# Patient Record
Sex: Female | Born: 1975
Health system: Southern US, Community
[De-identification: ages and names within clinical notes are randomized; demographics above are authoritative.]

## PROBLEM LIST (undated history)

## (undated) ENCOUNTER — Inpatient Hospital Stay (HOSPITAL_COMMUNITY): Payer: Self-pay

## (undated) DIAGNOSIS — R112 Nausea with vomiting, unspecified: Secondary | ICD-10-CM

## (undated) DIAGNOSIS — N83209 Unspecified ovarian cyst, unspecified side: Principal | ICD-10-CM

## (undated) DIAGNOSIS — O09529 Supervision of elderly multigravida, unspecified trimester: Secondary | ICD-10-CM

## (undated) DIAGNOSIS — T7840XA Allergy, unspecified, initial encounter: Secondary | ICD-10-CM

## (undated) DIAGNOSIS — Z8669 Personal history of other diseases of the nervous system and sense organs: Secondary | ICD-10-CM

## (undated) DIAGNOSIS — K219 Gastro-esophageal reflux disease without esophagitis: Secondary | ICD-10-CM

## (undated) DIAGNOSIS — M51369 Other intervertebral disc degeneration, lumbar region without mention of lumbar back pain or lower extremity pain: Secondary | ICD-10-CM

## (undated) DIAGNOSIS — Z9889 Other specified postprocedural states: Secondary | ICD-10-CM

## (undated) DIAGNOSIS — D649 Anemia, unspecified: Secondary | ICD-10-CM

## (undated) DIAGNOSIS — J45909 Unspecified asthma, uncomplicated: Secondary | ICD-10-CM

## (undated) DIAGNOSIS — O24419 Gestational diabetes mellitus in pregnancy, unspecified control: Secondary | ICD-10-CM

## (undated) DIAGNOSIS — Z8489 Family history of other specified conditions: Secondary | ICD-10-CM

## (undated) DIAGNOSIS — G709 Myoneural disorder, unspecified: Secondary | ICD-10-CM

## (undated) DIAGNOSIS — M5136 Other intervertebral disc degeneration, lumbar region: Secondary | ICD-10-CM

## (undated) DIAGNOSIS — M1990 Unspecified osteoarthritis, unspecified site: Secondary | ICD-10-CM

## (undated) HISTORY — DX: Allergy, unspecified, initial encounter: T78.40XA

## (undated) HISTORY — DX: Myoneural disorder, unspecified: G70.9

## (undated) HISTORY — DX: Other intervertebral disc degeneration, lumbar region without mention of lumbar back pain or lower extremity pain: M51.369

## (undated) HISTORY — DX: Unspecified osteoarthritis, unspecified site: M19.90

## (undated) HISTORY — PX: OTHER SURGICAL HISTORY: SHX169

## (undated) HISTORY — DX: Gastro-esophageal reflux disease without esophagitis: K21.9

## (undated) HISTORY — DX: Gestational diabetes mellitus in pregnancy, unspecified control: O24.419

## (undated) HISTORY — PX: TONSILLECTOMY: SUR1361

## (undated) HISTORY — PX: SMALL INTESTINE SURGERY: SHX150

## (undated) HISTORY — DX: Anemia, unspecified: D64.9

## (undated) HISTORY — PX: TUBAL LIGATION: SHX77

## (undated) HISTORY — DX: Personal history of other diseases of the nervous system and sense organs: Z86.69

## (undated) HISTORY — DX: Supervision of elderly multigravida, unspecified trimester: O09.529

## (undated) HISTORY — PX: HERNIA REPAIR: SHX51

## (undated) HISTORY — DX: Unspecified asthma, uncomplicated: J45.909

## (undated) HISTORY — DX: Other intervertebral disc degeneration, lumbar region: M51.36

## (undated) HISTORY — DX: Unspecified ovarian cyst, unspecified side: N83.209

---

## 2000-10-19 ENCOUNTER — Ambulatory Visit (HOSPITAL_COMMUNITY): Admission: RE | Admit: 2000-10-19 | Discharge: 2000-10-19 | Payer: Self-pay | Admitting: Pulmonary Disease

## 2004-06-11 ENCOUNTER — Encounter: Admission: RE | Admit: 2004-06-11 | Discharge: 2004-09-09 | Payer: Self-pay | Admitting: Neurology

## 2004-09-19 ENCOUNTER — Encounter: Admission: RE | Admit: 2004-09-19 | Discharge: 2004-10-02 | Payer: Self-pay | Admitting: Neurology

## 2005-08-12 ENCOUNTER — Encounter: Admission: RE | Admit: 2005-08-12 | Discharge: 2005-08-12 | Payer: Self-pay | Admitting: General Surgery

## 2005-08-13 ENCOUNTER — Ambulatory Visit (HOSPITAL_COMMUNITY): Admission: RE | Admit: 2005-08-13 | Discharge: 2005-08-13 | Payer: Self-pay | Admitting: General Surgery

## 2005-08-22 ENCOUNTER — Ambulatory Visit (HOSPITAL_COMMUNITY): Admission: RE | Admit: 2005-08-22 | Discharge: 2005-08-22 | Payer: Self-pay | Admitting: General Surgery

## 2005-11-25 ENCOUNTER — Ambulatory Visit: Payer: Self-pay | Admitting: Gastroenterology

## 2005-12-01 ENCOUNTER — Ambulatory Visit: Payer: Self-pay | Admitting: Gastroenterology

## 2005-12-01 ENCOUNTER — Encounter: Payer: Self-pay | Admitting: Gastroenterology

## 2006-01-01 ENCOUNTER — Ambulatory Visit: Payer: Self-pay | Admitting: Gastroenterology

## 2007-02-09 ENCOUNTER — Encounter: Admission: RE | Admit: 2007-02-09 | Discharge: 2007-05-10 | Payer: Self-pay | Admitting: General Surgery

## 2007-02-23 ENCOUNTER — Inpatient Hospital Stay (HOSPITAL_COMMUNITY): Admission: RE | Admit: 2007-02-23 | Discharge: 2007-02-25 | Payer: Self-pay | Admitting: General Surgery

## 2007-02-24 ENCOUNTER — Encounter (INDEPENDENT_AMBULATORY_CARE_PROVIDER_SITE_OTHER): Payer: Self-pay | Admitting: General Surgery

## 2007-02-24 ENCOUNTER — Ambulatory Visit: Payer: Self-pay | Admitting: Vascular Surgery

## 2007-11-16 ENCOUNTER — Other Ambulatory Visit: Admission: RE | Admit: 2007-11-16 | Discharge: 2007-11-16 | Payer: Self-pay | Admitting: Obstetrics and Gynecology

## 2009-02-19 ENCOUNTER — Encounter: Admission: RE | Admit: 2009-02-19 | Discharge: 2009-02-19 | Payer: Self-pay | Admitting: General Surgery

## 2009-10-31 ENCOUNTER — Other Ambulatory Visit: Admission: RE | Admit: 2009-10-31 | Discharge: 2009-10-31 | Payer: Self-pay | Admitting: Obstetrics and Gynecology

## 2010-05-12 ENCOUNTER — Encounter: Payer: Self-pay | Admitting: General Surgery

## 2010-09-03 NOTE — Op Note (Signed)
NAMEJAMARRIA, REAL NO.:  [id number]   MEDICAL RECORD NO.:  [id number]          PATIENT TYPE:  INP   LOCATION:  1530                         FACILITY:  [facility]   PHYSICIAN:  Sandria Bales. Ezzard Standing, M.D.  DATE OF BIRTH:  10/30/1975   DATE OF PROCEDURE:  02/23/2007  DATE OF DISCHARGE:                               OPERATIVE REPORT   PREOPERATIVE DIAGNOSIS:  Morbid obesity, status post laparoscopic Roux-  en-Y gastric bypass.   POSTOPERATIVE DIAGNOSIS:  Morbid obesity, status post laparoscopic Roux-  en-Y gastric bypass.   PROCEDURE:  Upper endoscopy.   SURGEON:  Sandria Bales. Ezzard Standing, M.D.   FIRST ASSISTANT:  None.   ANESTHESIA:  General endotracheal.   ESTIMATED BLOOD LOSS:  None.   DESCRIPTION OF PROCEDURE:  [name] is a 35 year old white female who  has completed a laparoscopic Roux-en-Y gastric bypass by Dr. Jaclynn Guarneri.  I am doing upper endoscopy to confirm patency of the  anastomosis and a check for leak and viability of the pouch.   OPERATIVE NOTE:  With the patient in supine position, I passed an  Olympus flexible endoscope without difficulty down the back of throat  into the stomach pouch.  I identified the gastrojejunal anastomosis at  43-44 cm; it was widely patent.  I insufflated with air, while Dr.  Johna Sheriff clamped the small bowel.  Dr. Johna Sheriff flooded the upper  abdomen with saline and there was no bubbling or no air leak.   I then withdrew to the esophagogastric junction, which was at 38-39 cm;  so this is about a 4-5 cm patch.  The pouch was tubular appearing.  The  mucosa looked healthy.   The distal esophagus was also within normal limits.   The patient tolerated the procedure well.  I withdrew the scope.  Dr.  Johna Sheriff will dictate the laparoscopic Roux-en-Y gastric bypass.      Sandria Bales. Ezzard Standing, M.D.  Electronically Signed     DHN/MEDQ  D:  02/23/2007  T:  02/23/2007  Job:  [id number]

## 2010-09-03 NOTE — Op Note (Signed)
Kristen Dunn, Kristen Dunn                   ACCOUNT NO.:  [id number]   MEDICAL RECORD NO.:  [id number]          PATIENT TYPE:  INP   LOCATION:  0001                         FACILITY:  [facility]   PHYSICIAN:  Sharlet Salina T. Hoxworth, M.D.DATE OF BIRTH:  Aug 30, 1975   DATE OF PROCEDURE:  02/23/2007  DATE OF DISCHARGE:                               OPERATIVE REPORT   PREOPERATIVE DIAGNOSIS:  Morbid obesity.   POSTOPERATIVE DIAGNOSIS:  Morbid obesity.   SURGICAL PROCEDURES:  Laparoscopic Roux-en-Y gastric bypass.   SURGEON:  Glenna Fellows, M.D.   ASSISTANT:  Ovidio Kin, M.D.   ANESTHESIA:  General.   BRIEF HISTORY:  [name] is a 35 year old white female with a long  history of progressive morbid obesity and comorbidities of GERD, asthma  and joint pain.  After extensive pre-operative discussion and workup  detailed elsewhere, we elected to proceed with laparoscopic Roux-en-Y  gastric bypass.  The patient was brought to the operating room for this  procedure.   DESCRIPTION OF OPERATION:  The patient was brought to the operating room  and placed in a supine position on the operating table and general  endotracheal anesthesia was induced.  She had received preoperative IV  antibiotics.  She had an antibiotic and mechanical bowel prep at home.  _______ were in place.  She received preoperative subcutaneous Lovenox.  The abdomen was widely sterilely prepped and draped.  Correct patient  and procedure were verified.  Access was obtained through a 1 cm left  subcostal incision using the OptiVu trocar without difficulty and  pneumoperitoneum established.  Inspection was made for any evidence of  trocar injury and none seen.  Under direct vision an 11 mm trocar was  placed in the right para xiphoid area through the falciform ligament in  the right upper abdomen just above and to the left of the umbilicus  where the camera port and a 5 mm trocar placed in the left flank.  The  mesocolon was  elevated and the ligament of Treitz was clearly identified  and a 40-cm biliopancreatic limb measured.  At this point the small  bowel was divided with a single firing of the white load 45 mm stapler.  The mesentery was further divided a short distance with a harmonic  scalpel for mobility.  A Penrose drain was sutured to the end of the  Roux limb for identification.  Following this is a 100 cm Roux limb was  carefully measured.  At this point a side-to-side anastomosis was  created between the biliopancreatic and Roux limb creating enterotomies  with the harmonic scalpel and using a 45 mm white load stapler.  The  staple line was intact without bleeding.  The common enterotomy was  closed with running 2-0 Vicryl beginning at either end of the enterotomy  and tied centrally.  The anastomosis appeared to be widely patent with  excellent blood supply.  The mesenteric defect behind the  jejunojejunostomy was then exposed and was closed with a running 2-0  silk.  Suture and staple lines were coated with Tisseel tissue sealant.  The  patient was then placed in steep reverse Trendelenburg and through a  5 mm subxiphoid site the [facility] retractor was placed in the left lobe of  the liver and elevated with excellent exposure of the hiatus and  stomach.  The angle of His was mobilized with the harmonic scalpel back  down toward the left crus.  A 4 cm pouch at the EG junction was measured  along the lesser curve and the peritoneum was incised along the lesser  curve.  The stomach was elevated and careful dissection with the  harmonic scalpel was carried along the wall of the stomach back toward  the lesser sac and the free lesser sac entered without difficulty.  Initial firing of the gold echelon stapler about 4 cm across the stomach  was performed to right angles to the lesser curve.  The posterior  gastric attachments were then dissected up through the previously  dissected area at the angle of His  and a single blue load firing of the  60 mm echelon stapler was used to complete the gastric pouch creating a  nice tubular pouch about 4 cm.  The staple line of the gastric remnant  was oversewn with 2-0 silk.  The Roux limb was brought up anticolic to  the small gastric pouch without any tension.  An initial posterior  running 2-0 Vicryl was placed between the Roux limb and the pouch.  Enterotomies using the harmonic scalpel were then created between the  pouch and the Roux limb and a 45 mm blue load linear stapler was used to  create the anastomosis about 2 cm in length.  The staple line was intact  and without bleeding.  The common enterotomy was then closed from either  end tying in the middle with running 2-0 Vicryl.  The Ewald tube was  then passed down through the anastomosis and an outer running layer of 2-  0 Vicryl seromuscular suture was placed.  Following this the Ewald tube  was removed.  With the outlet of anastomosis clamped, Dr. Ezzard Standing  performed upper endoscopy and with the pouch tensely distended with air  under saline irrigation there was no evidence of leak and anastomosis  appeared widely patent.  Following this all air was suctioned.  The  staple and suture lines of the gastrojejunostomy were coated with  Tisseel tissue sealant.  It was carefully inspected for any trocar  injury or hemorrhage and none seen.  Trocars were removed and all CO2  evacuated.  Skin incisions were closed with Dermabond.  Sponge and  needle counts were correct.  The patient was taken to recovery in good  condition.      Lorne Skeens. Hoxworth, M.D.  Electronically Signed     BTH/MEDQ  D:  02/23/2007  T:  02/23/2007  Job:  [id number]

## 2010-09-06 NOTE — Assessment & Plan Note (Signed)
[facility]                           GASTROENTEROLOGY OFFICE NOTE   NAME:Kristen Dunn, Kristen Dunn                          MRN:          [id number]  DATE:01/01/2006                            DOB:          March 23, 1976    Tobi Bastos had endoscopy and esophageal diltation.  She had a 5 cm hiatal hernia,  peptic stricture of the esophagus.  Biopsy showed no evidence of Barrett's  mucosa.   She initially was on AcipHex 20 mg twice a day and is now on it once a day  and is completely asymptomatic.  She has had no side effects from Aciphex  that she had noted previously with Nexium and Prilosec.  We had a long  discussion concerning acid reflux management.  I will continue to follow her  on an as needed basis as needed.  I have given her good standard anti-reflux  regimen with her but think she probably is a good candidate for long-term  PPI therapy because of her complicated acid reflux disease.                                   Vania Rea. Jarold Motto, MD, Clementeen [place], [place]   DRP/MedQ  DD:  01/01/2006  DT:  01/02/2006  Job #:  [id number]   cc:   Ramon Dredge L. Juanetta Gosling, M.D.  Lorne Skeens. Hoxworth, M.D.

## 2010-09-06 NOTE — Assessment & Plan Note (Signed)
Kristen Dunn                           GASTROENTEROLOGY OFFICE NOTE   NAME:Dunn, Kristen MILLAR                          MRN:          [id number]  DATE:11/25/2005                            DOB:          Sep 21, 1975    [name] is a 35 year old white female employee at [employer].  She is  referred by Dr. Juanetta Dunn and Dr. Johna Dunn today for evaluation of recurrent  refractory reflux symptoms over the last 8-10 years with 6-8 months of  progressive solid food dysphagia.   Kristen Dunn has been intolerant of Nexium and Prilosec because of nausea.  She has  been taking Pepcid twice daily.  She continues with severe regurgitation,  burning sternal chest pain, both night and daytime.  For the last 6-8  months, she has had progressive solid food dysphagia in her mid substernal  area.  She denies hepatobiliary complaints.  She has morbid obesity and has  plans for bariatric surgery later this year.  Ultrasound of the upper  abdomen in April of this year was unremarkable without any evidence of fatty  infiltration of the liver or cholelithiasis.   The patient does have asthmatic bronchitis, which she feels is acid reflux  related.  She uses p.r.n. ventilator inhalers.  She denies other extra  esophageal manifestations of GERD.  She has regular bowel movements without  melena or hematochezia.   PAST MEDICAL HISTORY:  Otherwise remarkable for degenerative arthritis and  chronic headaches.   MEDICATIONS:  Pepcid over-the-counter 2 daily, Depo-Provera shots every 3  months, p.r.n. Ventolin inhalers, and p.r.n. Advil.   She denies drug allergies.   FAMILY HISTORY:  Remarkable for colon polyps in her mother, age 61,  otherwise noncontributory.   SOCIAL HISTORY:  The patient is single and lives with her mother.  She has  an associate degree.  She does not smoke or abuse ethanol.   REVIEW OF SYSTEMS:  Noncontributory without history of Raynaud's phenomenon  or other symptoms  of collagen vascular disease.  She does have a mild  chronic anemia, diffuse arthralgias, insomnia, chronic low back pain.  Has  not had a menstrual period since Sep 07, 2005 because of Depo shots.  Review  of systems otherwise noncontributory.   PHYSICAL EXAMINATION:  GENERAL:  Exam shows her to be a healthy-appearing  white female in no distress.  I could not appreciate stigmata of chronic  liver disease.  There is no thyromegaly noted.  VITAL SIGNS:  She weighs 281 pounds.  She is 5 feet, 4-1/2 inches tall.  Blood pressure 140/90, pulse 100 and regular.  CHEST:  Clear.  HEART:  Appeared to be in a regular rhythm without murmurs, rubs or gallops.  ABDOMEN:  Exogenous obesity but no organomegaly, masses, or localized  tenderness.  Bowel sounds were normal.  EXTREMITIES:  Her upper extremities were unremarkable.  MENTAL STATUS:  Normal.  RECTAL:  Deferred.   ASSESSMENT:  1.  Morbid obesity with planned bariatric surgery.  2.  Chronic acid reflux, probably from peptic stricture of the esophagus.  3.  Irregular menstrual periods, on Depo-Provera  shots.  4.  History of degenerative arthritis.  5.  History of asthmatic bronchitis, probably related to acid reflux.   RECOMMENDATIONS:  1.  Acid reflux regime and restrictions.  2.  Trial of AcipHex 20 mg 30 minutes before breakfast and supper twice      daily.  3.  Outpatient endoscopic exam and probable esophageal dilatation.                                   Kristen Rea. Jarold Motto, MD, Kristen Dunn, Kristen Dunn   Kristen Dunn  DD:  11/25/2005  DT:  11/25/2005  Kristen #:  [id number]   cc:   Kristen Dredge L. Kristen Gosling, MD  Kristen Skeens. Hoxworth, MD

## 2011-01-28 LAB — DIFFERENTIAL
Eosinophils Absolute: 0
Eosinophils Absolute: 0
Lymphocytes Relative: 29
Lymphocytes Relative: 7 — ABNORMAL LOW
Lymphs Abs: 0.9
Lymphs Abs: 3
Monocytes Absolute: 0.8 — ABNORMAL HIGH
Monocytes Relative: 6
Monocytes Relative: 8
Neutrophils Relative %: 87 — ABNORMAL HIGH

## 2011-01-28 LAB — CBC
MCHC: 34.7
MCV: 92.3
MCV: 92.4
Platelets: 355
Platelets: 381
RBC: 3.62 — ABNORMAL LOW
RDW: 13.3
RDW: 13.7

## 2011-01-28 LAB — HEMOGLOBIN AND HEMATOCRIT, BLOOD
HCT: 32.3 — ABNORMAL LOW
Hemoglobin: 12.7

## 2011-01-29 LAB — HEMOGLOBIN AND HEMATOCRIT, BLOOD
HCT: 39.6
Hemoglobin: 13.6

## 2011-06-12 ENCOUNTER — Other Ambulatory Visit: Payer: Self-pay | Admitting: Adult Health

## 2011-06-12 ENCOUNTER — Other Ambulatory Visit (HOSPITAL_COMMUNITY)
Admission: RE | Admit: 2011-06-12 | Discharge: 2011-06-12 | Disposition: A | Discharge status: Home / Self Care | Payer: BC Managed Care – PPO | Source: Ambulatory Visit | Attending: Obstetrics and Gynecology | Admitting: Obstetrics and Gynecology

## 2011-06-12 DIAGNOSIS — Z113 Encounter for screening for infections with a predominantly sexual mode of transmission: Secondary | ICD-10-CM | POA: Insufficient documentation

## 2011-06-12 DIAGNOSIS — Z01419 Encounter for gynecological examination (general) (routine) without abnormal findings: Secondary | ICD-10-CM | POA: Insufficient documentation

## 2011-06-12 LAB — OB RESULTS CONSOLE ABO/RH: RH Type: POSITIVE

## 2011-06-12 LAB — OB RESULTS CONSOLE GC/CHLAMYDIA: Chlamydia: NEGATIVE

## 2011-06-12 LAB — OB RESULTS CONSOLE HEPATITIS B SURFACE ANTIGEN: Hepatitis B Surface Ag: NEGATIVE

## 2011-06-12 LAB — OB RESULTS CONSOLE HIV ANTIBODY (ROUTINE TESTING): HIV: NONREACTIVE

## 2011-06-12 LAB — OB RESULTS CONSOLE RUBELLA ANTIBODY, IGM: Rubella: IMMUNE

## 2011-06-12 LAB — OB RESULTS CONSOLE ANTIBODY SCREEN: Antibody Screen: NEGATIVE

## 2011-11-19 ENCOUNTER — Encounter: Payer: BC Managed Care – PPO | Attending: Obstetrics & Gynecology | Admitting: Dietician

## 2011-11-19 VITALS — Ht 65.0 in | Wt 212.5 lb

## 2011-11-19 DIAGNOSIS — O99810 Abnormal glucose complicating pregnancy: Secondary | ICD-10-CM | POA: Insufficient documentation

## 2011-11-19 DIAGNOSIS — O24419 Gestational diabetes mellitus in pregnancy, unspecified control: Secondary | ICD-10-CM

## 2011-11-19 DIAGNOSIS — Z713 Dietary counseling and surveillance: Secondary | ICD-10-CM | POA: Insufficient documentation

## 2011-11-20 ENCOUNTER — Encounter: Payer: Self-pay | Admitting: Dietician

## 2011-11-20 NOTE — Progress Notes (Signed)
  Patient was seen on 11/19/2011 for Gestational Diabetes self-management class at [facility]. She reports a history of gastric bypass surgery 02/23/07. This is her first pregnancy.  She completed the class but will need 1:1 follow-up for dietary guidelines in light of her gastric bypass surgery. The following learning objectives were met by the patient during this course:   States the definition of Gestational Diabetes  States why dietary management is important in controlling blood glucose  Describes the effects each nutrient has on blood glucose levels  Demonstrates ability to create a balanced meal plan  Demonstrates carbohydrate counting   States when to check blood glucose levels  Demonstrates proper blood glucose monitoring techniques  States the effect of stress and exercise on blood glucose levels  States the importance of limiting caffeine and abstaining from alcohol and smoking  Blood glucose monitor given: One Touch Ultra Mini Lot # [id number] X Exp: 11/2012 Blood glucose reading: 92 at 6:00 PM  Patient instructed to monitor glucose levels:Fasting and 2 hr after first bite of each meal FBS: 60 - <90 2 hour: <120  Patient received handouts:  Nutrition Diabetes and Pregnancy  Carbohydrate Counting List  Patient will be seen for follow-up as needed.

## 2011-12-10 ENCOUNTER — Inpatient Hospital Stay (HOSPITAL_COMMUNITY)
Admission: AD | Admit: 2011-12-10 | Discharge: 2011-12-10 | Disposition: A | Discharge status: Home / Self Care | Payer: BC Managed Care – PPO | Source: Ambulatory Visit | Attending: Obstetrics & Gynecology | Admitting: Obstetrics & Gynecology

## 2011-12-10 ENCOUNTER — Other Ambulatory Visit: Payer: Self-pay | Admitting: Obstetrics & Gynecology

## 2011-12-10 DIAGNOSIS — O99810 Abnormal glucose complicating pregnancy: Secondary | ICD-10-CM | POA: Insufficient documentation

## 2011-12-10 MED ORDER — LACTATED RINGERS IV SOLN
INTRAVENOUS | Status: DC
  Administered 2011-12-10: 18:00:00 via INTRAVENOUS

## 2011-12-10 MED ORDER — FERUMOXYTOL INJECTION 510 MG/17 ML
510.0000 mg | Freq: Once | INTRAVENOUS | Status: AC
  Administered 2011-12-10: 510 mg via INTRAVENOUS
  Filled 2011-12-10: qty 17

## 2011-12-25 LAB — OB RESULTS CONSOLE GBS: GBS: POSITIVE

## 2012-01-09 ENCOUNTER — Telehealth (HOSPITAL_COMMUNITY): Payer: Self-pay | Admitting: *Deleted

## 2012-01-09 ENCOUNTER — Encounter (HOSPITAL_COMMUNITY): Payer: Self-pay | Admitting: *Deleted

## 2012-01-09 NOTE — Telephone Encounter (Signed)
Preadmission screen  

## 2012-01-11 ENCOUNTER — Inpatient Hospital Stay (HOSPITAL_COMMUNITY)
Admission: RE | Admit: 2012-01-11 | Discharge: 2012-01-15 | DRG: 651 | Disposition: A | Discharge status: Home / Self Care | Payer: BC Managed Care – PPO | Source: Ambulatory Visit | Attending: Obstetrics & Gynecology | Admitting: Obstetrics & Gynecology

## 2012-01-11 ENCOUNTER — Encounter (HOSPITAL_COMMUNITY): Payer: Self-pay

## 2012-01-11 VITALS — BP 102/69 | HR 85 | Temp 98.3°F | Resp 18 | Ht 65.0 in | Wt 223.0 lb

## 2012-01-11 DIAGNOSIS — O09519 Supervision of elderly primigravida, unspecified trimester: Secondary | ICD-10-CM | POA: Diagnosis present

## 2012-01-11 DIAGNOSIS — O99814 Abnormal glucose complicating childbirth: Principal | ICD-10-CM | POA: Diagnosis present

## 2012-01-11 DIAGNOSIS — Z22330 Carrier of Group B streptococcus: Secondary | ICD-10-CM

## 2012-01-11 DIAGNOSIS — O99892 Other specified diseases and conditions complicating childbirth: Secondary | ICD-10-CM | POA: Diagnosis present

## 2012-01-11 DIAGNOSIS — Z98891 History of uterine scar from previous surgery: Secondary | ICD-10-CM

## 2012-01-11 LAB — CBC
HCT: 30 % — ABNORMAL LOW (ref 36.0–46.0)
Hemoglobin: 9.1 g/dL — ABNORMAL LOW (ref 12.0–15.0)
MCHC: 30.3 g/dL (ref 30.0–36.0)
MCV: 78.9 fL (ref 78.0–100.0)
RDW: 22.5 % — ABNORMAL HIGH (ref 11.5–15.5)

## 2012-01-11 LAB — GLUCOSE, CAPILLARY: Glucose-Capillary: 78 mg/dL (ref 70–99)

## 2012-01-11 MED ORDER — LIDOCAINE HCL (PF) 1 % IJ SOLN: 30.0000 mL | INTRAMUSCULAR | Status: DC | PRN

## 2012-01-11 MED ORDER — MISOPROSTOL 25 MCG QUARTER TABLET
25.0000 ug | ORAL_TABLET | ORAL | Status: DC | PRN
  Administered 2012-01-11 (×2): 25 ug via VAGINAL
  Filled 2012-01-11 (×2): qty 0.25

## 2012-01-11 MED ORDER — LACTATED RINGERS IV SOLN
INTRAVENOUS | Status: DC
  Administered 2012-01-11: 16:00:00 via INTRAVENOUS
  Administered 2012-01-11: 125 mL/h via INTRAVENOUS
  Administered 2012-01-12 – 2012-01-13 (×2): via INTRAVENOUS
  Administered 2012-01-13: 125 mL/h via INTRAVENOUS
  Administered 2012-01-13 (×2): via INTRAVENOUS

## 2012-01-11 MED ORDER — ONDANSETRON HCL 4 MG/2ML IJ SOLN: 4.0000 mg | Freq: Four times a day (QID) | INTRAMUSCULAR | Status: DC | PRN

## 2012-01-11 MED ORDER — ACETAMINOPHEN 325 MG PO TABS
650.0000 mg | ORAL_TABLET | ORAL | Status: DC | PRN
  Administered 2012-01-11 – 2012-01-12 (×6): 650 mg via ORAL
  Filled 2012-01-11 (×6): qty 2

## 2012-01-11 MED ORDER — FLEET ENEMA 7-19 GM/118ML RE ENEM: 1.0000 | ENEMA | RECTAL | Status: DC | PRN

## 2012-01-11 MED ORDER — OXYTOCIN BOLUS FROM INFUSION
500.0000 mL | Freq: Once | INTRAVENOUS | Status: DC
  Filled 2012-01-11: qty 500

## 2012-01-11 MED ORDER — IBUPROFEN 600 MG PO TABS: 600.0000 mg | ORAL_TABLET | Freq: Four times a day (QID) | ORAL | Status: DC | PRN

## 2012-01-11 MED ORDER — OXYTOCIN 40 UNITS IN LACTATED RINGERS INFUSION - SIMPLE MED: 62.5000 mL/h | Freq: Once | INTRAVENOUS | Status: DC

## 2012-01-11 MED ORDER — TERBUTALINE SULFATE 1 MG/ML IJ SOLN: 0.2500 mg | Freq: Once | INTRAMUSCULAR | Status: AC | PRN

## 2012-01-11 MED ORDER — LACTATED RINGERS IV SOLN
500.0000 mL | INTRAVENOUS | Status: DC | PRN
  Administered 2012-01-13: 1000 mL via INTRAVENOUS
  Administered 2012-01-13: 500 mL via INTRAVENOUS

## 2012-01-11 MED ORDER — GLYBURIDE 2.5 MG PO TABS
2.5000 mg | ORAL_TABLET | Freq: Every day | ORAL | Status: DC
  Filled 2012-01-11: qty 1

## 2012-01-11 MED ORDER — OXYCODONE-ACETAMINOPHEN 5-325 MG PO TABS: 1.0000 | ORAL_TABLET | ORAL | Status: DC | PRN

## 2012-01-11 MED ORDER — CITRIC ACID-SODIUM CITRATE 334-500 MG/5ML PO SOLN
30.0000 mL | ORAL | Status: DC | PRN
  Administered 2012-01-13: 30 mL via ORAL
  Filled 2012-01-11: qty 15

## 2012-01-11 MED ORDER — GLYBURIDE 5 MG PO TABS
5.0000 mg | ORAL_TABLET | Freq: Every day | ORAL | Status: DC
Start: 2012-01-11 — End: 2012-01-11
  Administered 2012-01-11: 2.5 mg via ORAL
  Filled 2012-01-11 (×2): qty 1

## 2012-01-11 MED ORDER — VANCOMYCIN HCL IN DEXTROSE 1-5 GM/200ML-% IV SOLN
1000.0000 mg | Freq: Two times a day (BID) | INTRAVENOUS | Status: DC
  Administered 2012-01-11 – 2012-01-13 (×4): 1000 mg via INTRAVENOUS
  Filled 2012-01-11 (×5): qty 200

## 2012-01-11 MED ORDER — ZOLPIDEM TARTRATE 5 MG PO TABS
5.0000 mg | ORAL_TABLET | Freq: Once | ORAL | Status: AC
  Administered 2012-01-11: 5 mg via ORAL

## 2012-01-11 NOTE — Plan of Care (Signed)
Problem: Consults Goal: Birthing Suites Patient Information Press F2 to bring up selections list  Outcome: Completed/Met Date Met:  01/11/12  Diabetic

## 2012-01-11 NOTE — H&P (Signed)
Kristen Dunn is a 36 y.o. female @ [redacted]w[redacted]d presenting for IOL for GDM controlled w/Glyburide.   Maternal Medical History:  Contractions: Frequency: rare.   Perceived severity is mild.    Fetal activity: Perceived fetal activity is normal.   Last perceived fetal movement was within the past hour.      OB History    Grav Para Term Preterm Abortions TAB SAB Ect Mult Living   1              Past Medical History  Diagnosis Date  . Asthma   . Anemia   . AMA (advanced maternal age) multigravida 35+   . Gestational diabetes     glyburide   Past Surgical History  Procedure Date  . Gastric bypass 2008   . Tonsillectomy    Family History: family history includes Aneurysm in her father; Asthma in her mother and sister; COPD in her mother; Hypertension in her father; Stroke in her maternal grandmother; Thyroid disease in her maternal grandmother; and Von Willebrand disease in her cousin. Social History:  reports that she has never smoked. She has never used smokeless tobacco. She reports that she does not drink alcohol or use illicit drugs.   Prenatal Transfer Tool  Maternal Diabetes: Yes:  Diabetes Type:  Insulin/Medication controlled Genetic Screening: Abnormal:  Results: Elevated risk of Trisomy 21 Maternal Ultrasounds/Referrals: Normal Fetal Ultrasounds or other Referrals:  None Maternal Substance Abuse:  No Significant Maternal Medications:  None Significant Maternal Lab Results:  None Other Comments:  None  Review of Systems  Constitutional: Negative.   HENT: Negative.   Eyes: Negative.   Respiratory: Negative.   Cardiovascular: Negative.   Gastrointestinal: Negative.   Genitourinary: Negative.   Musculoskeletal: Negative.   Skin: Negative.   Neurological: Negative.   Psychiatric/Behavioral: Negative.    EFW on 12/25/11 was 3095 grams, 55%  Dilation: Closed Effacement (%): Thick Station: -3 Exam by:: J.Thornton, RN Blood pressure 114/61, pulse 87, temperature 98.9  F (37.2 C), temperature source Oral, resp. rate 16, height 5\' 5"  (1.651 m), weight 101.152 kg (223 lb), last menstrual period 04/13/2011.  Maternal Exam:  Uterine Assessment: Contraction strength is mild.  Contraction duration is 50 seconds. Contraction frequency is irregular.   Abdomen: Patient reports no abdominal tenderness. Estimated fetal weight is 8lbs.       Fetal Exam Fetal Monitor Review: Mode: ultrasound.   Baseline rate: 140.  Variability: moderate (6-25 bpm).   Pattern: accelerations present and no decelerations.    Fetal State Assessment: Category I - tracings are normal.     Physical Exam  Constitutional: She is oriented to person, place, and time. She appears well-developed.  HENT:  Head: Normocephalic.  Neck: Normal range of motion.  Cardiovascular: Normal rate.   Respiratory: Effort normal.  GI: Soft.  Musculoskeletal: Normal range of motion.  Neurological: She is alert and oriented to person, place, and time.  Skin: Skin is warm and dry.  Psychiatric: She has a normal mood and affect.    Prenatal labs: ABO, Rh: A/Positive/-- (02/21 0000) Antibody: Negative (02/21 0000) Rubella: Immune (02/21 0000) RPR: Nonreactive (02/21 0000)  HBsAg: Negative (02/21 0000)  HIV: Non-reactive (02/21 0000)  GBS: Positive (09/05 0000)   Assessment/Plan: IUP@[redacted]w[redacted]d  Class A2 DM GBS positive  Admit, efm per unit policy, cervical ripening w/cytotec, CBGs q 4h, continue Gyburide, Vancomycin when active, anticipate SVD.   Lawernce Pitts 01/11/2012, 1:58 PM

## 2012-01-11 NOTE — Progress Notes (Signed)
Monitors off for pt to shower--CNM to order interm, monitoring

## 2012-01-11 NOTE — Progress Notes (Signed)
Kristen Dunn is a 36 y.o. G1P0 at [redacted]w[redacted]d.  Subjective: Mildly uncomfortable w/ UC's  Objective: BP 112/62  Pulse 81  Temp 98.3 F (36.8 C) (Oral)  Resp 18  Ht 5\' 5"  (1.651 m)  Wt 101.152 kg (223 lb)  BMI 37.11 kg/m2  LMP 04/13/2011     FHT:  FHR: 140 bpm, variability: moderate,  accelerations:  Present,  decelerations:  Absent UC:   regular, every 1-3 minutes, mild SVE:   Dilation: 1 Effacement (%): Thick Station: -3 Exam by:: Kristen Dunn, CNM Foley balloon placed in cervix w/out difficulty.  Labs: Lab Results  Component Value Date   WBC 10.0 01/11/2012   HGB 9.1* 01/11/2012   HCT 30.0* 01/11/2012   MCV 78.9 01/11/2012   PLT 308 01/11/2012    Assessment / Plan: Induction of labor due to gestational diabetes,  progressing well. Foloey bulb inserted.  Labor: Progressing normally Preeclampsia:  NA Fetal Wellbeing:  Category I Pain Control:  Labor support without medications I/D:  n/a Anticipated MOD:  NSVD  Kristen Dunn 01/11/2012, 5:50 PM

## 2012-01-11 NOTE — H&P (Signed)
Attestation of Attending Supervision of Advanced Practitioner: Evaluation and management procedures were performed by the PA/NP/CNM/OB Fellow under my supervision/collaboration. Chart reviewed and agree with management and plan, to include cervical ripening with cytotec, and subsequent consideration of Foley bulb cervical ripening then pitocin.  Bunnie Rehberg V 01/11/2012 4:54 PM

## 2012-01-12 ENCOUNTER — Encounter (HOSPITAL_COMMUNITY): Payer: Self-pay

## 2012-01-12 LAB — GLUCOSE, CAPILLARY
Glucose-Capillary: 62 mg/dL — ABNORMAL LOW (ref 70–99)
Glucose-Capillary: 74 mg/dL (ref 70–99)
Glucose-Capillary: 77 mg/dL (ref 70–99)
Glucose-Capillary: 94 mg/dL (ref 70–99)
Glucose-Capillary: 117 mg/dL — ABNORMAL HIGH (ref 70–99)

## 2012-01-12 MED ORDER — FENTANYL 2.5 MCG/ML BUPIVACAINE 1/10 % EPIDURAL INFUSION (WH - ANES)
14.0000 mL/h | INTRAMUSCULAR | Status: DC
  Administered 2012-01-13 (×4): 14 mL/h via EPIDURAL
  Filled 2012-01-12 (×5): qty 60

## 2012-01-12 MED ORDER — EPHEDRINE 5 MG/ML INJ
10.0000 mg | INTRAVENOUS | Status: DC | PRN
  Filled 2012-01-12: qty 4

## 2012-01-12 MED ORDER — OXYTOCIN 40 UNITS IN LACTATED RINGERS INFUSION - SIMPLE MED
1.0000 m[IU]/min | INTRAVENOUS | Status: DC
  Administered 2012-01-12: 2 m[IU]/min via INTRAVENOUS
  Administered 2012-01-12: 6 m[IU]/min via INTRAVENOUS
  Administered 2012-01-12: 8 m[IU]/min via INTRAVENOUS
  Filled 2012-01-12: qty 1000

## 2012-01-12 MED ORDER — PHENYLEPHRINE 40 MCG/ML (10ML) SYRINGE FOR IV PUSH (FOR BLOOD PRESSURE SUPPORT): 80.0000 ug | PREFILLED_SYRINGE | INTRAVENOUS | Status: DC | PRN

## 2012-01-12 MED ORDER — DIPHENHYDRAMINE HCL 50 MG/ML IJ SOLN: 12.5000 mg | INTRAMUSCULAR | Status: DC | PRN

## 2012-01-12 MED ORDER — TERBUTALINE SULFATE 1 MG/ML IJ SOLN: 0.2500 mg | Freq: Once | INTRAMUSCULAR | Status: AC | PRN

## 2012-01-12 MED ORDER — LACTATED RINGERS IV SOLN: 500.0000 mL | Freq: Once | INTRAVENOUS | Status: DC

## 2012-01-12 MED ORDER — FENTANYL CITRATE 0.05 MG/ML IJ SOLN: 100.0000 ug | INTRAMUSCULAR | Status: DC | PRN

## 2012-01-12 MED ORDER — EPHEDRINE 5 MG/ML INJ: 10.0000 mg | INTRAVENOUS | Status: DC | PRN

## 2012-01-12 NOTE — Progress Notes (Signed)
Kristen Dunn is a 36 y.o. G1P0000 at [redacted]w[redacted]d admitted for induction of labor due to Diabetes.  Subjective: Starting to get more uncomfortable, needing to breath through contractions  Objective: BP 103/85  Pulse 87  Temp 98.4 F (36.9 C) (Oral)  Resp 20  Ht 5\' 5"  (1.651 m)  Wt 101.152 kg (223 lb)  BMI 37.11 kg/m2  LMP 04/13/2011      FHT:  FHR: 140 bpm, variability: moderate,  accelerations:  Present,  decelerations:  Absent UC:   regular, every 2 minutes SVE:   Dilation: 3 Effacement (%): 70 Station: -3 Exam by:: Kristen Dunn  Labs: Lab Results  Component Value Date   WBC 10.0 01/11/2012   HGB 9.1* 01/11/2012   HCT 30.0* 01/11/2012   MCV 78.9 01/11/2012   PLT 308 01/11/2012    Assessment / Plan: Induction of labor due to gestational diabetes,  progressing well on pitocin  Labor: Progressing on Pitocin, will continue to increase then AROM Preeclampsia:  n/a Fetal Wellbeing:  Category I Pain Control:  Labor support without medications and epidural once in active labor I/D:  Vanc for GBS +, PCN allergic Anticipated MOD:  NSVD  Kristen Dunn 01/12/2012, 11:07 PM

## 2012-01-12 NOTE — Progress Notes (Signed)
Kristen Dunn is a 36 y.o. G1P0 at [redacted]w[redacted]d by LMP admitted for induction of labor due to Diabetes.  Subjective: Feeling some ctx, requesting to walk  Objective: BP 114/77  Pulse 106  Temp 97.9 F (36.6 C) (Axillary)  Resp 20  Ht 5\' 5"  (1.651 m)  Wt 101.152 kg (223 lb)  BMI 37.11 kg/m2  LMP 04/13/2011      FHT:  FHR: 150 bpm, variability: moderate,  accelerations:  Present,  decelerations:  Absent UC:   regular, every 3-4 minutes SVE:  Deferred Labs: Lab Results  Component Value Date   WBC 10.0 01/11/2012   HGB 9.1* 01/11/2012   HCT 30.0* 01/11/2012   MCV 78.9 01/11/2012   PLT 308 01/11/2012    Assessment / Plan: IUP@[redacted]w[redacted]d  Latent labor Pitocin restarted Anticipate SVD  Kristen Dunn 01/12/2012, 7:31 PM

## 2012-01-12 NOTE — Progress Notes (Signed)
Kristen Dunn is a 36 y.o. G1P0 at [redacted]w[redacted]d admitted for induction of labor due to Gestational diabetes.  Subjective: Comfortable, feeling some ctx  Objective: BP 113/59  Pulse 88  Temp 98.3 F (36.8 C) (Oral)  Resp 20  Ht 5\' 5"  (1.651 m)  Wt 101.152 kg (223 lb)  BMI 37.11 kg/m2  LMP 04/13/2011      FHT:  FHR: 140 bpm, variability: moderate,  accelerations:  Present,  decelerations:  Absent UC:   regular, every 1-5 minutes SVE:   deferred Labs: Lab Results  Component Value Date   WBC 10.0 01/11/2012   HGB 9.1* 01/11/2012   HCT 30.0* 01/11/2012   MCV 78.9 01/11/2012   PLT 308 01/11/2012    Assessment / Plan: Induction of labor due to gestational diabetes,  progressing well on pitocin  Labor: Progressing normally Fetal Wellbeing:  Category I Pain Control:  Labor support without medications, Analgesia/anesthesia PRN  I/D:  n/a Anticipated MOD:  NSVD  Lawernce Pitts 01/12/2012, 10:06 AM

## 2012-01-12 NOTE — Progress Notes (Signed)
Kristen Dunn is a 36 y.o. G1P0000 at [redacted]w[redacted]d admitted for induction of labor due to Diabetes.  Subjective: Doing well, starting to feel contractions more/stronger.  Would like epidural eventually, but likes being able to walk around.   Objective: BP 119/83  Pulse 100  Temp 98.9 F (37.2 C) (Oral)  Resp 20  Ht 5\' 5"  (1.651 m)  Wt 101.152 kg (223 lb)  BMI 37.11 kg/m2  LMP 04/13/2011      FHT:  FHR: 140 bpm, variability: moderate,  accelerations:  Present,  decelerations:  Absent UC:   regular, every 2-3 minutes SVE:   Dilation: 3 Effacement (%): 70 Station: -3 Exam by:: Briar Witherspoon  Labs: Lab Results  Component Value Date   WBC 10.0 01/11/2012   HGB 9.1* 01/11/2012   HCT 30.0* 01/11/2012   MCV 78.9 01/11/2012   PLT 308 01/11/2012    Assessment / Plan: Induction of labor due to gestational diabetes,  progressing well on pitocin  Labor: Progressing on Pitocin, will continue to increase then AROM; will need to be started on insulin when in active labor with q1 hr CBG checks to ensure tight glucose control around the time of delivery Preeclampsia:  n/a Fetal Wellbeing:  Category I Pain Control:  epidural once in active stage I/D:  Vanc (GBS +, PCN allergic) Anticipated MOD:  NSVD  Loye Vento 01/12/2012, 9:07 PM

## 2012-01-12 NOTE — Progress Notes (Signed)
Kristen Dunn is a 36 y.o. G1P0 at [redacted]w[redacted]d admitted for induction of labor due to Gestational diabetes.  Subjective: Pt rested overnight and feels all right this morning.  Objective: BP 111/62  Pulse 80  Temp 98 F (36.7 C) (Oral)  Resp 16  Ht 5\' 5"  (1.651 m)  Wt 101.152 kg (223 lb)  BMI 37.11 kg/m2  LMP 04/13/2011      FHT:  FHR: 140s bpm, variability: moderate,  accelerations:  Present,  decelerations:  Absent UC:   regular, every 2-4 minutes SVE:   Dilation: 3 Effacement (%): 40 Station: Ballotable Exam by:: [employer]: Lab Results  Component Value Date   WBC 10.0 01/11/2012   HGB 9.1* 01/11/2012   HCT 30.0* 01/11/2012   MCV 78.9 01/11/2012   PLT 308 01/11/2012    Assessment / Plan: 36 y.o. G1P0 at [redacted]w[redacted]d here for induction of labor due to gestational diabetes, progressing well. Foley bulb removed after some deflation of saline.   Labor: Cervix not fully dilated yet but soft and 3cm.  Allow pt to shower, walk around, and eat light breakfast and then start pitocin around 8:30 AM. Preeclampsia: NA  Fetal Wellbeing: Category I  Pain Control: Labor support without medications  I/D: n/a  Anticipated MOD: NSVD  Simone Curia 01/12/2012, 6:03 AM

## 2012-01-12 NOTE — Progress Notes (Signed)
Kristen Dunn is a 36 y.o. G1P0 at [redacted]w[redacted]d by LMP admitted for IOL for A2GDM.  Subjective: Pt comfortable, reports feeling mild contractions but no pain.  Pt and family express frustration that labor is not progressing.  Objective: BP 125/70  Pulse 99  Temp 98.4 F (36.9 C) (Oral)  Resp 20  Ht 5\' 5"  (1.651 m)  Wt 101.152 kg (223 lb)  BMI 37.11 kg/m2  LMP 04/13/2011      FHT:  FHR: 135 bpm, variability: moderate,  accelerations:  Present,  decelerations:  Absent UC:   regular, every 3 minutes SVE:   Dilation: 3 Effacement (%): 50 Station: -3 Exam by:: Kristen Sarna, RN  Labs: Lab Results  Component Value Date   WBC 10.0 01/11/2012   HGB 9.1* 01/11/2012   HCT 30.0* 01/11/2012   MCV 78.9 01/11/2012   PLT 308 01/11/2012    Assessment / Plan: Induction of labor due to gestational diabetes,  not yet in active labor, on pitocin  Discussed induction of labor with pt, length of labor, normal expectations.  Discussed option of giving pt a break, off Pitocin, and off EFM x1 hour.  Pt would like to walk in hallway at this time.  Plan to turn off Pitocin x1 hour, allow pt to walk, shower, eat lightly Restart Pitocin on 2 milliunits after break  Fetal Wellbeing:  Category I Pain Control:  Labor support without medications  Anticipated MOD:  NSVD  Kristen Dunn 01/12/2012, 4:07 PM

## 2012-01-12 NOTE — Progress Notes (Signed)
I have seen this patient and agree with the above midwife student's note.  LEFTWICH-KIRBY, Sidni Fusco Certified Nurse-Midwife 

## 2012-01-13 ENCOUNTER — Encounter (HOSPITAL_COMMUNITY): Payer: Self-pay | Admitting: Anesthesiology

## 2012-01-13 ENCOUNTER — Encounter (HOSPITAL_COMMUNITY): Payer: Self-pay

## 2012-01-13 ENCOUNTER — Inpatient Hospital Stay (HOSPITAL_COMMUNITY): Payer: BC Managed Care – PPO | Admitting: Anesthesiology

## 2012-01-13 ENCOUNTER — Encounter (HOSPITAL_COMMUNITY)
Admission: RE | Disposition: A | Discharge status: Home / Self Care | Payer: Self-pay | Source: Ambulatory Visit | Attending: Obstetrics & Gynecology

## 2012-01-13 LAB — GLUCOSE, CAPILLARY
Glucose-Capillary: 87 mg/dL (ref 70–99)
Glucose-Capillary: 82 mg/dL (ref 70–99)
Glucose-Capillary: 136 mg/dL — ABNORMAL HIGH (ref 70–99)

## 2012-01-13 LAB — CBC
HCT: 31.5 % — ABNORMAL LOW (ref 36.0–46.0)
MCH: 23.9 pg — ABNORMAL LOW (ref 26.0–34.0)
MCHC: 30.2 g/dL (ref 30.0–36.0)
RDW: 23.7 % — ABNORMAL HIGH (ref 11.5–15.5)

## 2012-01-13 SURGERY — Surgical Case
Anesthesia: Epidural | Site: Abdomen | Wound class: Clean Contaminated

## 2012-01-13 MED ORDER — LIDOCAINE HCL (PF) 1 % IJ SOLN
INTRAMUSCULAR | Status: DC | PRN
  Administered 2012-01-13 (×2): 9 mL

## 2012-01-13 MED ORDER — WITCH HAZEL-GLYCERIN EX PADS: 1.0000 "application " | MEDICATED_PAD | CUTANEOUS | Status: DC | PRN

## 2012-01-13 MED ORDER — ONDANSETRON HCL 4 MG PO TABS
4.0000 mg | ORAL_TABLET | ORAL | Status: DC | PRN
  Administered 2012-01-15: 4 mg via ORAL
  Filled 2012-01-13: qty 1

## 2012-01-13 MED ORDER — LANOLIN HYDROUS EX OINT: 1.0000 "application " | TOPICAL_OINTMENT | CUTANEOUS | Status: DC | PRN

## 2012-01-13 MED ORDER — MEPERIDINE HCL 25 MG/ML IJ SOLN: 6.2500 mg | INTRAMUSCULAR | Status: DC | PRN

## 2012-01-13 MED ORDER — NALBUPHINE HCL 10 MG/ML IJ SOLN
5.0000 mg | INTRAMUSCULAR | Status: DC | PRN
  Filled 2012-01-13: qty 1

## 2012-01-13 MED ORDER — MORPHINE SULFATE 0.5 MG/ML IJ SOLN
INTRAMUSCULAR | Status: AC
  Filled 2012-01-13: qty 10

## 2012-01-13 MED ORDER — DIPHENHYDRAMINE HCL 25 MG PO CAPS: 25.0000 mg | ORAL_CAPSULE | Freq: Four times a day (QID) | ORAL | Status: DC | PRN

## 2012-01-13 MED ORDER — MENTHOL 3 MG MT LOZG: 1.0000 | LOZENGE | OROMUCOSAL | Status: DC | PRN

## 2012-01-13 MED ORDER — EPHEDRINE 5 MG/ML INJ
INTRAVENOUS | Status: AC
  Filled 2012-01-13: qty 10

## 2012-01-13 MED ORDER — DIPHENHYDRAMINE HCL 50 MG/ML IJ SOLN: 12.5000 mg | INTRAMUSCULAR | Status: DC | PRN

## 2012-01-13 MED ORDER — TETANUS-DIPHTH-ACELL PERTUSSIS 5-2.5-18.5 LF-MCG/0.5 IM SUSP: 0.5000 mL | Freq: Once | INTRAMUSCULAR | Status: DC

## 2012-01-13 MED ORDER — MORPHINE SULFATE (PF) 0.5 MG/ML IJ SOLN
INTRAMUSCULAR | Status: DC | PRN
  Administered 2012-01-13: 2000 ug via INTRAVENOUS
  Administered 2012-01-13: 3000 ug via EPIDURAL

## 2012-01-13 MED ORDER — FENTANYL CITRATE 0.05 MG/ML IJ SOLN
INTRAMUSCULAR | Status: AC
  Administered 2012-01-13: 50 ug via INTRAVENOUS
  Filled 2012-01-13: qty 2

## 2012-01-13 MED ORDER — SODIUM BICARBONATE 8.4 % IV SOLN
INTRAVENOUS | Status: AC
  Filled 2012-01-13: qty 50

## 2012-01-13 MED ORDER — OXYCODONE-ACETAMINOPHEN 5-325 MG PO TABS
1.0000 | ORAL_TABLET | ORAL | Status: DC | PRN
Start: 2012-01-13 — End: 2012-01-15
  Administered 2012-01-14: 1 via ORAL
  Administered 2012-01-14: 2 via ORAL
  Administered 2012-01-14 (×3): 1 via ORAL
  Administered 2012-01-15 (×2): 2 via ORAL
  Filled 2012-01-13: qty 2
  Filled 2012-01-13: qty 1
  Filled 2012-01-13: qty 2
  Filled 2012-01-13: qty 1
  Filled 2012-01-13: qty 2
  Filled 2012-01-13 (×2): qty 1

## 2012-01-13 MED ORDER — MORPHINE SULFATE (PF) 0.5 MG/ML IJ SOLN: INTRAMUSCULAR | Status: DC | PRN

## 2012-01-13 MED ORDER — FENTANYL CITRATE 0.05 MG/ML IJ SOLN
25.0000 ug | INTRAMUSCULAR | Status: DC | PRN
  Administered 2012-01-13 (×2): 50 ug via INTRAVENOUS

## 2012-01-13 MED ORDER — LACTATED RINGERS IV SOLN
INTRAVENOUS | Status: DC | PRN
  Administered 2012-01-13: 19:00:00 via INTRAVENOUS

## 2012-01-13 MED ORDER — EPHEDRINE SULFATE 50 MG/ML IJ SOLN
INTRAMUSCULAR | Status: DC | PRN
  Administered 2012-01-13 (×2): 10 mg via INTRAVENOUS

## 2012-01-13 MED ORDER — NALOXONE HCL 0.4 MG/ML IJ SOLN: 0.4000 mg | INTRAMUSCULAR | Status: DC | PRN

## 2012-01-13 MED ORDER — SIMETHICONE 80 MG PO CHEW: 80.0000 mg | CHEWABLE_TABLET | ORAL | Status: DC | PRN

## 2012-01-13 MED ORDER — LIDOCAINE HCL (CARDIAC) 20 MG/ML IV SOLN
INTRAVENOUS | Status: AC
  Filled 2012-01-13: qty 5

## 2012-01-13 MED ORDER — SIMETHICONE 80 MG PO CHEW
80.0000 mg | CHEWABLE_TABLET | Freq: Three times a day (TID) | ORAL | Status: DC
  Administered 2012-01-14 – 2012-01-15 (×5): 80 mg via ORAL

## 2012-01-13 MED ORDER — KETOROLAC TROMETHAMINE 60 MG/2ML IM SOLN
INTRAMUSCULAR | Status: AC
  Administered 2012-01-13: 60 mg via INTRAMUSCULAR
  Filled 2012-01-13: qty 2

## 2012-01-13 MED ORDER — METOCLOPRAMIDE HCL 5 MG/ML IJ SOLN: 10.0000 mg | Freq: Three times a day (TID) | INTRAMUSCULAR | Status: DC | PRN

## 2012-01-13 MED ORDER — SODIUM BICARBONATE 8.4 % IV SOLN
INTRAVENOUS | Status: DC | PRN
  Administered 2012-01-13: 5 mL via EPIDURAL

## 2012-01-13 MED ORDER — PHENYLEPHRINE 40 MCG/ML (10ML) SYRINGE FOR IV PUSH (FOR BLOOD PRESSURE SUPPORT)
PREFILLED_SYRINGE | INTRAVENOUS | Status: AC
  Filled 2012-01-13: qty 5

## 2012-01-13 MED ORDER — KETOROLAC TROMETHAMINE 60 MG/2ML IM SOLN
60.0000 mg | Freq: Once | INTRAMUSCULAR | Status: AC | PRN
  Administered 2012-01-13: 60 mg via INTRAMUSCULAR

## 2012-01-13 MED ORDER — FENTANYL 2.5 MCG/ML BUPIVACAINE 1/10 % EPIDURAL INFUSION (WH - ANES)
INTRAMUSCULAR | Status: DC | PRN
  Administered 2012-01-13: 14 mL/h via EPIDURAL

## 2012-01-13 MED ORDER — SENNOSIDES-DOCUSATE SODIUM 8.6-50 MG PO TABS
2.0000 | ORAL_TABLET | Freq: Every day | ORAL | Status: DC
  Administered 2012-01-14: 2 via ORAL

## 2012-01-13 MED ORDER — ONDANSETRON HCL 4 MG/2ML IJ SOLN: 4.0000 mg | INTRAMUSCULAR | Status: DC | PRN

## 2012-01-13 MED ORDER — METRONIDAZOLE IN NACL 5-0.79 MG/ML-% IV SOLN: 500.0000 mg | INTRAVENOUS | Status: DC

## 2012-01-13 MED ORDER — GENTAMICIN SULFATE 40 MG/ML IJ SOLN: 5.0000 mg/kg | INTRAVENOUS | Status: DC

## 2012-01-13 MED ORDER — IBUPROFEN 600 MG PO TABS: 600.0000 mg | ORAL_TABLET | Freq: Four times a day (QID) | ORAL | Status: DC

## 2012-01-13 MED ORDER — KETOROLAC TROMETHAMINE 30 MG/ML IJ SOLN
30.0000 mg | Freq: Four times a day (QID) | INTRAMUSCULAR | Status: AC | PRN
  Administered 2012-01-14 (×2): 30 mg via INTRAVENOUS
  Filled 2012-01-13 (×2): qty 1

## 2012-01-13 MED ORDER — METRONIDAZOLE IN NACL 5-0.79 MG/ML-% IV SOLN
500.0000 mg | INTRAVENOUS | Status: AC
  Administered 2012-01-13: 500 mg via INTRAVENOUS
  Filled 2012-01-13: qty 100

## 2012-01-13 MED ORDER — OXYTOCIN 40 UNITS IN LACTATED RINGERS INFUSION - SIMPLE MED: 62.5000 mL/h | INTRAVENOUS | Status: AC

## 2012-01-13 MED ORDER — SODIUM CHLORIDE 0.9 % IV SOLN
1.0000 ug/kg/h | INTRAVENOUS | Status: DC | PRN
  Filled 2012-01-13: qty 2.5

## 2012-01-13 MED ORDER — DIPHENHYDRAMINE HCL 50 MG/ML IJ SOLN: 25.0000 mg | INTRAMUSCULAR | Status: DC | PRN

## 2012-01-13 MED ORDER — DIBUCAINE 1 % RE OINT: 1.0000 "application " | TOPICAL_OINTMENT | RECTAL | Status: DC | PRN

## 2012-01-13 MED ORDER — ONDANSETRON HCL 4 MG/2ML IJ SOLN
INTRAMUSCULAR | Status: AC
  Filled 2012-01-13: qty 2

## 2012-01-13 MED ORDER — SCOPOLAMINE 1 MG/3DAYS TD PT72
MEDICATED_PATCH | TRANSDERMAL | Status: AC
  Administered 2012-01-13: 1.5 mg via TRANSDERMAL
  Filled 2012-01-13: qty 1

## 2012-01-13 MED ORDER — IBUPROFEN 600 MG PO TABS: 600.0000 mg | ORAL_TABLET | Freq: Four times a day (QID) | ORAL | Status: DC | PRN

## 2012-01-13 MED ORDER — LACTATED RINGERS IV SOLN
INTRAVENOUS | Status: DC
  Administered 2012-01-14 (×3): via INTRAVENOUS

## 2012-01-13 MED ORDER — KETOROLAC TROMETHAMINE 30 MG/ML IJ SOLN: 30.0000 mg | Freq: Four times a day (QID) | INTRAMUSCULAR | Status: AC | PRN

## 2012-01-13 MED ORDER — ONDANSETRON HCL 4 MG/2ML IJ SOLN: 4.0000 mg | Freq: Three times a day (TID) | INTRAMUSCULAR | Status: DC | PRN

## 2012-01-13 MED ORDER — DIPHENHYDRAMINE HCL 25 MG PO CAPS: 25.0000 mg | ORAL_CAPSULE | ORAL | Status: DC | PRN

## 2012-01-13 MED ORDER — SODIUM CHLORIDE 0.9 % IJ SOLN: 3.0000 mL | INTRAMUSCULAR | Status: DC | PRN

## 2012-01-13 MED ORDER — OXYTOCIN 10 UNIT/ML IJ SOLN
40.0000 [IU] | INTRAVENOUS | Status: DC | PRN
  Administered 2012-01-13: 40 [IU] via INTRAVENOUS

## 2012-01-13 MED ORDER — PRENATAL MULTIVITAMIN CH
1.0000 | ORAL_TABLET | Freq: Every day | ORAL | Status: DC
  Administered 2012-01-14 – 2012-01-15 (×2): 1 via ORAL
  Filled 2012-01-13 (×2): qty 1

## 2012-01-13 MED ORDER — PHENYLEPHRINE HCL 10 MG/ML IJ SOLN
INTRAMUSCULAR | Status: DC | PRN
  Administered 2012-01-13: 80 ug via INTRAVENOUS
  Administered 2012-01-13: 40 ug via INTRAVENOUS

## 2012-01-13 MED ORDER — GENTAMICIN SULFATE 40 MG/ML IJ SOLN
5.0000 mg/kg | INTRAVENOUS | Status: AC
  Administered 2012-01-13: 373.5 mg via INTRAVENOUS
  Filled 2012-01-13: qty 9.34

## 2012-01-13 MED ORDER — LACTATED RINGERS IV SOLN
INTRAVENOUS | Status: DC | PRN
  Administered 2012-01-13 (×2): via INTRAVENOUS

## 2012-01-13 MED ORDER — SCOPOLAMINE 1 MG/3DAYS TD PT72
1.0000 | MEDICATED_PATCH | Freq: Once | TRANSDERMAL | Status: DC
  Administered 2012-01-13: 1.5 mg via TRANSDERMAL

## 2012-01-13 MED ORDER — ONDANSETRON HCL 4 MG/2ML IJ SOLN
INTRAMUSCULAR | Status: DC | PRN
  Administered 2012-01-13: 4 mg via INTRAVENOUS

## 2012-01-13 SURGICAL SUPPLY — 29 items
BRR ADH 6X5 SEPRAFILM 1 SHT (MISCELLANEOUS)
CONTAINER PREFILL 10% NBF 15ML (MISCELLANEOUS) IMPLANT
DRAPE SURG 17X23 STRL (DRAPES) ×2 IMPLANT
DRESSING TELFA 8X3 (GAUZE/BANDAGES/DRESSINGS) IMPLANT
DRSG COVADERM 4X10 (GAUZE/BANDAGES/DRESSINGS) ×2 IMPLANT
DURAPREP 26ML APPLICATOR (WOUND CARE) ×2 IMPLANT
ELECT REM PT RETURN 9FT ADLT (ELECTROSURGICAL) ×2
ELECTRODE REM PT RTRN 9FT ADLT (ELECTROSURGICAL) ×1 IMPLANT
EXTRACTOR VACUUM M CUP 4 TUBE (SUCTIONS) IMPLANT
GAUZE SPONGE 4X4 12PLY STRL LF (GAUZE/BANDAGES/DRESSINGS) IMPLANT
GLOVE BIOGEL PI IND STRL 6.5 (GLOVE) ×2 IMPLANT
GLOVE BIOGEL PI INDICATOR 6.5 (GLOVE) ×2
GLOVE SURG SS PI 6.0 STRL IVOR (GLOVE) ×2 IMPLANT
GOWN PREVENTION PLUS LG XLONG (DISPOSABLE) ×6 IMPLANT
KIT ABG SYR 3ML LUER SLIP (SYRINGE) IMPLANT
NEEDLE HYPO 25X5/8 SAFETYGLIDE (NEEDLE) IMPLANT
NS IRRIG 1000ML POUR BTL (IV SOLUTION) ×2 IMPLANT
PACK C SECTION WH (CUSTOM PROCEDURE TRAY) ×2 IMPLANT
PAD ABD 7.5X8 STRL (GAUZE/BANDAGES/DRESSINGS) IMPLANT
PAD OB MATERNITY 4.3X12.25 (PERSONAL CARE ITEMS) IMPLANT
RTRCTR C-SECT PINK 25CM LRG (MISCELLANEOUS) IMPLANT
SEPRAFILM MEMBRANE 5X6 (MISCELLANEOUS) IMPLANT
SLEEVE SCD COMPRESS KNEE MED (MISCELLANEOUS) ×2 IMPLANT
STAPLER VISISTAT 35W (STAPLE) ×2 IMPLANT
SUT PLAIN 0 NONE (SUTURE) IMPLANT
SUT VIC AB 0 CT1 36 (SUTURE) ×8 IMPLANT
TOWEL OR 17X24 6PK STRL BLUE (TOWEL DISPOSABLE) ×4 IMPLANT
TRAY FOLEY CATH 14FR (SET/KITS/TRAYS/PACK) ×2 IMPLANT
WATER STERILE IRR 1000ML POUR (IV SOLUTION) IMPLANT

## 2012-01-13 NOTE — Progress Notes (Signed)
Abdominal binder placed.

## 2012-01-13 NOTE — Anesthesia Preprocedure Evaluation (Signed)
Anesthesia Evaluation  Patient identified by MRN, date of birth, ID band Patient awake    Reviewed: Allergy & Precautions, H&P , NPO status , Patient's Chart, lab work & pertinent test results  Airway Mallampati: II TM Distance: >3 FB Neck ROM: full    Dental No notable dental hx.    Pulmonary neg pulmonary ROS,    Pulmonary exam normal       Cardiovascular negative cardio ROS      Neuro/Psych negative neurological ROS  negative psych ROS   GI/Hepatic negative GI ROS, Neg liver ROS,   Endo/Other  Gestational, Oral Hypoglycemic AgentsMorbid obesity  Renal/GU negative Renal ROS  negative genitourinary   Musculoskeletal negative musculoskeletal ROS (+)   Abdominal (+) + obese,   Peds negative pediatric ROS (+)  Hematology negative hematology ROS (+)   Anesthesia Other Findings   Reproductive/Obstetrics (+) Pregnancy                           Anesthesia Physical Anesthesia Plan  ASA: III  Anesthesia Plan: Epidural   Post-op Pain Management:    Induction:   Airway Management Planned:   Additional Equipment:   Intra-op Plan:   Post-operative Plan:   Informed Consent: I have reviewed the patients History and Physical, chart, labs and discussed the procedure including the risks, benefits and alternatives for the proposed anesthesia with the patient or authorized representative who has indicated his/her understanding and acceptance.     Plan Discussed with:   Anesthesia Plan Comments:         Anesthesia Quick Evaluation

## 2012-01-13 NOTE — Progress Notes (Signed)
Kristen Dunn is a 36 y.o. G1P0000 at [redacted]w[redacted]d admitted for induction of labor due to Gestational diabetes.  Subjective: Pt feeling comfortable, not feeling contractions.  Objective: BP 112/65  Pulse 75  Temp 98.1 F (36.7 C) (Oral)  Resp 18  Ht 5\' 5"  (1.651 m)  Wt 101.152 kg (223 lb)  BMI 37.11 kg/m2  SpO2 100%  LMP 04/13/2011      FHT:  FHR: 145 bpm, variability: moderate,  accelerations:  Present,  decelerations:  Present occasional variables UC:   regular, every 2-3 minutes SVE:   Dilation: 4.5 Effacement (%): 100 Station: Ballotable and -3 With bulging membranes Exam by:: Oran Rein, MD  Labs: Lab Results  Component Value Date   WBC 16.2* 01/13/2012   HGB 9.5* 01/13/2012   HCT 31.5* 01/13/2012   MCV 79.1 01/13/2012   PLT 361 01/13/2012    Assessment / Plan: Induction of labor due to gestational diabetes,  progressing slowly on pitocin at 10 milliunits currently  Labor: Pitocin at 10 milliunits currently, increase as tolerated with close watch on fetal monitor; continue changing positions, currently sitting upright, was left exagg sims x 1 hour and right exagg sims x 1 hour beforehand Preeclampsia:  n/a Fetal Wellbeing:  Category II Pain Control:  Epidural I/D:  n/a Anticipated MOD:  NSVD  Simone Curia 01/13/2012, 11:02 AM

## 2012-01-13 NOTE — Op Note (Signed)
Kristen Dunn PROCEDURE DATE: 01/11/2012 - 01/13/2012  PREOPERATIVE DIAGNOSIS: Intrauterine pregnancy at  [redacted]w[redacted]d weeks gestation; failure to progress: arrest of dilation  POSTOPERATIVE DIAGNOSIS: The same  PROCEDURE:     Cesarean Section  SURGEON:  Dr. Catalina Antigua  ASSISTANT: none  INDICATIONS: Kristen Dunn is a 36 y.o. G1P1001 at [redacted]w[redacted]d scheduled for cesarean section secondary to failure to progress: arrest of dilation.  Patient received 14 hours of pitocin and cervix remained at 4-5 cm. Patient requested primary cesarean section. The risks of cesarean section discussed with the patient included but were not limited to: bleeding which may require transfusion or reoperation; infection which may require antibiotics; injury to bowel, bladder, ureters or other surrounding organs; injury to the fetus; need for additional procedures including hysterectomy in the event of a life-threatening hemorrhage; placental abnormalities wth subsequent pregnancies, incisional problems, thromboembolic phenomenon and other postoperative/anesthesia complications. The patient concurred with the proposed plan, giving informed written consent for the procedure.    FINDINGS:  Viable female/female infant in cephalic presentation.  Apgars 9 and 9, weight unavailable at the time of witting this note.  Clear amniotic fluid.  Intact placenta, three vessel cord.  Normal uterus, fallopian tubes and ovaries bilaterally.  ANESTHESIA:    Spinal INTRAVENOUS FLUIDS:2200 ml ESTIMATED BLOOD LOSS: 750 ml URINE OUTPUT:  50 ml SPECIMENS: Placenta sent to L&D COMPLICATIONS: None immediate  PROCEDURE IN DETAIL:  The patient received intravenous antibiotics and had sequential compression devices applied to her lower extremities while in the preoperative area.  She was then taken to the operating room where anesthesia was induced and was found to be adequate. A foley catheter was placed into her bladder and attached to Kristen Dunn gravity.  She was then placed in a dorsal supine position with a leftward tilt, and prepped and draped in a sterile manner. After an adequate timeout was performed, a Pfannenstiel skin incision was made with scalpel and carried through to the underlying layer of fascia. The fascia was incised in the midline and this incision was extended bilaterally using the Mayo scissors. Kocher clamps were applied to the superior aspect of the fascial incision and the underlying rectus muscles were dissected off bluntly. A similar process was carried out on the inferior aspect of the facial incision. The rectus muscles were separated in the midline bluntly and the peritoneum was entered bluntly. The Alexis self-retaining retractor was introduced into the abdominal cavity. Attention was turned to the lower uterine segment where a bladder flap was created, and a transverse hysterotomy was made with a scalpel and extended bilaterally bluntly. The infant was successfully delivered, and cord was clamped and cut and infant was handed over to awaiting neonatology team. Uterine massage was then administered and the placenta delivered intact with three-vessel cord. The uterus was cleared of clot and debris.  The hysterotomy was closed with 0 Vicryl in a running locked fashion, and an imbricating layer was also placed with a 0 Vicryl. Overall, excellent hemostasis was noted. The pelvis copiously irrigated and cleared of all clot and debris. Hemostasis was confirmed on all surfaces.  The peritoneum and the muscles were reapproximated using 0 vicryl interrupted stitches. The fascia was then closed using 0 Vicryl in a running locked fashion.  The subcutaneous layer was reapproximated with plain gut and the skin was closed with staples. The patient tolerated the procedure well. Sponge, lap, instrument and needle counts were correct x 2. She was taken to the recovery room in stable condition.  Kristen Dunn,Kristen Dunn  01/13/2012 7:03 PM

## 2012-01-13 NOTE — Transfer of Care (Signed)
Immediate Anesthesia Transfer of Care Note  Patient: Kristen Dunn  Procedure(s) Performed: Procedure(s) (LRB) with comments: CESAREAN SECTION (N/A)  Patient Location: PACU  Anesthesia Type: Epidural  Level of Consciousness: awake, alert  and oriented  Airway & Oxygen Therapy: Patient Spontanous Breathing  Post-op Assessment: Report given to PACU RN and Post -op Vital signs reviewed and stable  Post vital signs: Reviewed and stable  Complications: No apparent anesthesia complications

## 2012-01-13 NOTE — Progress Notes (Signed)
Kristen Dunn is a 36 y.o. G1P0000 at [redacted]w[redacted]d admitted for induction of labor due to Gestational diabetes.  Subjective: Felt some contractions but currently comfortable on epidural.  Objective: BP 115/67  Pulse 71  Temp 98.1 F (36.7 C) (Oral)  Resp 18  Ht 5\' 5"  (1.651 m)  Wt 101.152 kg (223 lb)  BMI 37.11 kg/m2  SpO2 100%  LMP 04/13/2011      FHT:  FHR: 150s bpm, variability: minimal-moderate,  accelerations:  Present,  decelerations:  Present variable with one late deceleration at 12:15 with good recovery UC:   irregular, every 10-15 minutes (reduced from more frequent contractions previously) SVE:   Dilation: 4.5 Effacement (%): 100 Station: Ballotable Exam by:: Dr Ilsa Iha  Labs: Lab Results  Component Value Date   WBC 16.2* 01/13/2012   HGB 9.5* 01/13/2012   HCT 31.5* 01/13/2012   MCV 79.1 01/13/2012   PLT 361 01/13/2012    Assessment / Plan: Induction of labor due to gestational diabetes, progressing slowly on pitocin, some repetitive late decelerations so continued changing position and decreased to pitocin 6 milliunits currently   Labor: Pitocin at 6 milliunits currently, increase as tolerated with close watch on fetal monitor; continue changing positions, currently changing to exaggerated left sims; place abdominal binder Preeclampsia: n/a  Fetal Wellbeing: Category II  Pain Control: Epidural  I/D: n/a  Gestational diabetes: q4 hours blood glucose check Anticipated MOD: NSVD   Simone Curia 01/13/2012, 2:01 PM

## 2012-01-13 NOTE — Progress Notes (Signed)
Patient ID: Kristen Dunn, female   DOB: 1975-10-21, 36 y.o.   MRN: [id number] Patient comfortable with epidural without complaints. Patient frustrated with lack of progression. Patient without cervical change for 14 hours. Not able to adequately augment labor without experiencing fetal late deceleration. Discussed with patient stopping pitocin for a few hours or proceeding with cesarean section. Patient opted for cesarean delivery. Risks, benefits and alternatives were explained, including but not limited to risk of bleeding, infection and damage to adjacent organs. Patient verbalized understanding and all questions were answered. Will proceed with cesarean delivery secondary to failure to progress.

## 2012-01-13 NOTE — Anesthesia Postprocedure Evaluation (Signed)
Anesthesia Post Note  Patient: Kristen Dunn  Procedure(s) Performed: Procedure(s) (LRB): CESAREAN SECTION (N/A)  Anesthesia type: Epidural  Patient location: PACU  Post pain: Pain level controlled  Post assessment: Post-op Vital signs reviewed  Last Vitals:  Filed Vitals:   01/13/12 1930  BP: 136/51  Pulse: 101  Temp:   Resp: 14    Post vital signs: Reviewed  Level of consciousness: awake  Complications: No apparent anesthesia complications

## 2012-01-13 NOTE — Anesthesia Procedure Notes (Signed)
Epidural Patient location during procedure: OB Start time: 01/13/2012 2:00 AM End time: 01/13/2012 2:06 AM  Staffing Anesthesiologist: Sandrea Hughs Performed by: anesthesiologist   Preanesthetic Checklist Completed: patient identified, site marked, surgical consent, pre-op evaluation, timeout performed, IV checked, risks and benefits discussed and monitors and equipment checked  Epidural Patient position: sitting Prep: site prepped and draped and DuraPrep Patient monitoring: continuous pulse ox and blood pressure Approach: midline Injection technique: LOR air  Needle:  Needle type: Tuohy  Needle gauge: 17 G Needle length: 9 cm and 9 Needle insertion depth: 7 cm Catheter type: closed end flexible Catheter size: 19 Gauge Catheter at skin depth: 12 cm Test dose: negative and Other  Assessment Sensory level: T9 Events: blood not aspirated, injection not painful, no injection resistance, negative IV test and no paresthesia  Additional Notes Reason for block:procedure for pain

## 2012-01-13 NOTE — Progress Notes (Signed)
Called Dr. Fara Boros re: FHR tracing. Turned off pitocin and started O2 per facemask, 10 L. Pt on left side, IV fluid bolus begun. Explained treatment plan to patient.

## 2012-01-13 NOTE — Progress Notes (Signed)
Kristen Dunn is a 36 y.o. G1P0000 at [redacted]w[redacted]d admitted for induction of labor due to Gestational diabetes.  Subjective: Pt feeling frustrated with length of induction and wants labor to be done with - of her options, prefers c-section now.  Objective: BP 108/72  Pulse 79  Temp 99.1 F (37.3 C) (Axillary)  Resp 16  Ht 5\' 5"  (1.651 m)  Wt 101.152 kg (223 lb)  BMI 37.11 kg/m2  SpO2 100%  LMP 04/13/2011     FHT:  Pitocin at 10 milliunits, period of repetitive lates, pitocin decreased to 6 milliunits, baseline increased to 160s-170 with minimal variability, currently 160bpm, minimal variability, no accelerations, no decelerations, category II UC:   regular, every 4-5 minutes SVE:   Dilation: 4.5 Effacement (%): 100 Station: Ballotable Exam by:: [employer]  Labs: Lab Results  Component Value Date   WBC 16.2* 01/13/2012   HGB 9.5* 01/13/2012   HCT 31.5* 01/13/2012   MCV 79.1 01/13/2012   PLT 361 01/13/2012    Assessment / Plan: Induction of labor due to gestational diabetes, progressing slowly on pitocin with minimal variability and repetitive late decelerations when pt gets into regular contraction pattern.     Labor: Induction of labor x 2.5 days with minimal progression.  Stop pitocin and plan for c-section  Preeclampsia: n/a  Fetal Wellbeing: Category II Pain Control: Epidural  I/D: n/a  Gestational diabetes: q4 hours blood glucose check  Anticipated MOD: c-section   Simone Curia 01/13/2012, 5:35 PM

## 2012-01-13 NOTE — Progress Notes (Signed)
Kristen Dunn is a 36 y.o. G1P0000 at [redacted]w[redacted]d admitted for induction of labor due to Gestational Diabetes.  Subjective: Feeling contractions more strongly; getting epidural placed now.   Objective: BP 112/73  Pulse 89  Temp 98.6 F (37 C) (Oral)  Resp 20  Ht 5\' 5"  (1.651 m)  Wt 101.152 kg (223 lb)  BMI 37.11 kg/m2  SpO2 100%  LMP 04/13/2011      FHT:  FHR: 130 bpm, variability: moderate,  accelerations:  Present,  decelerations:  Present variables UC:   regular, every 2-4 minutes SVE:   Dilation: 3.5 Effacement (%): 80 Station: -2 Exam by:: erin davis rn  Labs: Lab Results  Component Value Date   WBC 16.2* 01/13/2012   HGB 9.5* 01/13/2012   HCT 31.5* 01/13/2012   MCV 79.1 01/13/2012   PLT 361 01/13/2012    Assessment / Plan: Induction of labor due to gestational diabetes,  progressing well on pitocin  Labor: Progressing on Pitocin, will continue to increase then AROM Preeclampsia:  n/a Fetal Wellbeing:  Category I Pain Control:  Epidural I/D:  Vanc (GBS +, PCN allergic) Anticipated MOD:  NSVD  Zury Fazzino 01/13/2012, 3:12 AM

## 2012-01-14 ENCOUNTER — Encounter (HOSPITAL_COMMUNITY): Payer: Self-pay | Admitting: Obstetrics and Gynecology

## 2012-01-14 LAB — CBC
MCH: 24 pg — ABNORMAL LOW (ref 26.0–34.0)
MCHC: 30.6 g/dL (ref 30.0–36.0)
MCV: 78.6 fL (ref 78.0–100.0)
Platelets: 245 10*3/uL (ref 150–400)

## 2012-01-14 NOTE — Anesthesia Postprocedure Evaluation (Signed)
  Anesthesia Post-op Note  Patient: Kristen Dunn  Procedure(s) Performed: Procedure(s) (LRB) with comments: CESAREAN SECTION (N/A)  Patient Location: Mother/Baby  Anesthesia Type: Epidural  Level of Consciousness: awake  Airway and Oxygen Therapy: Patient Spontanous Breathing  Post-op Pain: none  Post-op Assessment: Patient's Cardiovascular Status Stable, Respiratory Function Stable, RESPIRATORY FUNCTION UNSTABLE, Patent Airway, No signs of Nausea or vomiting, Adequate PO intake, Pain level controlled, No headache, No backache, No residual numbness and No residual motor weakness  Post-op Vital Signs: Reviewed and stable  Complications: No apparent anesthesia complications

## 2012-01-14 NOTE — Addendum Note (Signed)
Addendum  created 01/14/12 0803 by Suella Grove, CRNA   Modules edited:Notes Section

## 2012-01-14 NOTE — Progress Notes (Signed)
I have seen and examined this patient and I agree with the above. Cam Hai 11:29 PM 01/14/2012

## 2012-01-14 NOTE — Progress Notes (Signed)
Subjective: Postpartum Day 1: Cesarean Delivery  Patient reports incisional pain and tolerating PO.  Has not had BM or flatus yet.  Reports bilateral leg cramping and mild swelling.  Has SCDs on but they are not turned on.  No problems with bleeding, urination.  Objective: Vital signs in last 24 hours: Temp:  [97.9 F (36.6 C)-99.4 F (37.4 C)] 98 F (36.7 C) (09/25 0630) Pulse Rate:  [63-118] 83  (09/25 0630) Resp:  [12-18] 18  (09/25 0630) BP: (99-136)/(51-87) 102/65 mmHg (09/25 0630) SpO2:  [96 %-100 %] 97 % (09/25 0630)  Physical Exam:  General: alert, cooperative, appears stated age and no distress CV: RRR PULM: CTAB, nl effort ABD: NABS, soft, mildly tender Lochia: appropriate Uterine Fundus: firm Incision: healing well, no significant drainage, no dehiscence, no significant erythema DVT Evaluation: No evidence of DVT seen on physical exam. No significant calf/ankle edema. Mild bilateral calf tenderness.    Basename 01/14/12 0525 01/13/12 0125  HGB 6.3* 9.5*  HCT 20.6* 31.5*    Assessment/Plan: Status post Cesarean section. Doing well postoperatively.  Continue current care.  Plan for discharge tomorrow or the following day. Pt plans circumcision at FT, birth control unsure, breastfeeding. Instructed pt to walk some today and ask nurse to turn on SCDs; also to use incentive spirometer and let nurse know if any breathing difficulty.  Simone Curia 01/14/2012, 9:23 AM

## 2012-01-15 LAB — TYPE AND SCREEN: Unit division: 0

## 2012-01-15 MED ORDER — OXYCODONE-ACETAMINOPHEN 5-325 MG PO TABS: 1.0000 | ORAL_TABLET | ORAL | Status: DC | PRN

## 2012-01-15 NOTE — Discharge Summary (Signed)
Obstetric Discharge Summary Reason for Admission: induction of labor Prenatal Procedures: NST and ultrasound Intrapartum Procedures: cesarean: low cervical, transverse Postpartum Procedures: none Complications-Operative and Postpartum: none Hemoglobin  Date Value Range Status  01/14/2012 6.3* 12.0 - 15.0 g/dL Final     DELTA CHECK NOTED     REPEATED TO VERIFY     CRITICAL RESULT CALLED TO, READ BACK BY AND VERIFIED WITH:     L BRIGG 01/14/12 AT 0618 BY H SOEWARDIMAN     HCT  Date Value Range Status  01/14/2012 20.6* 36.0 - 46.0 % Final    Physical Exam:  General: alert, cooperative and no distress Lochia: appropriate Uterine Fundus: firm Incision: healing well DVT Evaluation: No evidence of DVT seen on physical exam.  Discharge Diagnoses: Term Pregnancy-delivered and gestational diabetes  Discharge Information: Date: 01/15/2012 Activity: pelvic rest Diet: routine Medications: Percocet Condition: stable Instructions: refer to practice specific booklet Discharge to: home Follow-up Information    Follow up with Lazaro Arms, MD. Call in 1 week.   Contact information:   [facility] [address] [place] [address] [phone]          Newborn Data: Live born female  Birth Weight: 8 lb 5.9 oz (3795 g) APGAR: 9,   Home with mother.  Kristen Dunn H 01/15/2012, 8:01 AM

## 2012-01-21 ENCOUNTER — Encounter (HOSPITAL_COMMUNITY): Payer: Self-pay

## 2012-03-23 ENCOUNTER — Observation Stay (HOSPITAL_COMMUNITY)
Admission: EM | Admit: 2012-03-23 | Discharge: 2012-03-24 | Disposition: A | Discharge status: Home / Self Care | Payer: BC Managed Care – PPO | Attending: General Surgery | Admitting: General Surgery

## 2012-03-23 ENCOUNTER — Encounter (HOSPITAL_COMMUNITY): Payer: Self-pay | Admitting: Emergency Medicine

## 2012-03-23 DIAGNOSIS — Z9884 Bariatric surgery status: Secondary | ICD-10-CM | POA: Insufficient documentation

## 2012-03-23 DIAGNOSIS — K562 Volvulus: Secondary | ICD-10-CM

## 2012-03-23 DIAGNOSIS — J45909 Unspecified asthma, uncomplicated: Secondary | ICD-10-CM | POA: Insufficient documentation

## 2012-03-23 DIAGNOSIS — R109 Unspecified abdominal pain: Principal | ICD-10-CM | POA: Insufficient documentation

## 2012-03-23 MED ORDER — ONDANSETRON HCL 4 MG/2ML IJ SOLN
4.0000 mg | Freq: Once | INTRAMUSCULAR | Status: AC
  Administered 2012-03-24: 4 mg via INTRAVENOUS
  Filled 2012-03-23: qty 2

## 2012-03-23 MED ORDER — MORPHINE SULFATE 4 MG/ML IJ SOLN
4.0000 mg | Freq: Once | INTRAMUSCULAR | Status: AC
  Administered 2012-03-24: 4 mg via INTRAVENOUS
  Filled 2012-03-23: qty 1

## 2012-03-23 NOTE — ED Notes (Signed)
Patient ambulatory to restroom, family with patient at this time. 

## 2012-03-23 NOTE — ED Notes (Signed)
Patient c/o middle abdominal pain since 5pm; patient denies nausea, vomiting or diarrhea.  Patient states had gastric bypass 5 years ago and had a baby 2 months ago.  Patient states she cannot stand up straight.

## 2012-03-24 ENCOUNTER — Emergency Department (HOSPITAL_COMMUNITY): Payer: BC Managed Care – PPO

## 2012-03-24 LAB — CBC WITH DIFFERENTIAL/PLATELET
Basophils Absolute: 0.1 10*3/uL (ref 0.0–0.1)
Basophils Relative: 1 % (ref 0–1)
Eosinophils Relative: 2 % (ref 0–5)
Lymphocytes Relative: 39 % (ref 12–46)
MCHC: 31 g/dL (ref 30.0–36.0)
MCV: 82.8 fL (ref 78.0–100.0)
Neutro Abs: 2.8 10*3/uL (ref 1.7–7.7)
Platelets: 327 10*3/uL (ref 150–400)
RDW: 18.2 % — ABNORMAL HIGH (ref 11.5–15.5)
WBC: 5.5 10*3/uL (ref 4.0–10.5)

## 2012-03-24 LAB — URINALYSIS, ROUTINE W REFLEX MICROSCOPIC
Bilirubin Urine: NEGATIVE
Hgb urine dipstick: NEGATIVE
Nitrite: NEGATIVE
Protein, ur: NEGATIVE mg/dL
Specific Gravity, Urine: >1.03 — ABNORMAL HIGH (ref 1.005–1.030)
Urobilinogen, UA: 0.2 mg/dL (ref 0.0–1.0)

## 2012-03-24 LAB — COMPREHENSIVE METABOLIC PANEL
Albumin: 4 g/dL (ref 3.5–5.2)
Alkaline Phosphatase: 81 U/L (ref 39–117)
BUN: 9 mg/dL (ref 6–23)
Chloride: 105 mEq/L (ref 96–112)
Creatinine, Ser: 0.75 mg/dL (ref 0.50–1.10)
GFR calc Af Amer: >90 mL/min (ref >90)
Glucose, Bld: 98 mg/dL (ref 70–99)
Potassium: 4.2 mEq/L (ref 3.5–5.1)
Total Bilirubin: 0.8 mg/dL (ref 0.3–1.2)
Total Protein: 6.9 g/dL (ref 6.0–8.3)

## 2012-03-24 LAB — LIPASE, BLOOD: Lipase: 24 U/L (ref 11–59)

## 2012-03-24 LAB — POCT PREGNANCY, URINE: Preg Test, Ur: NEGATIVE

## 2012-03-24 MED ORDER — OXYCODONE-ACETAMINOPHEN 5-325 MG PO TABS
1.0000 | ORAL_TABLET | ORAL | Status: DC | PRN
  Administered 2012-03-24 (×2): 1 via ORAL
  Filled 2012-03-24 (×2): qty 1

## 2012-03-24 MED ORDER — HYDROMORPHONE HCL PF 1 MG/ML IJ SOLN
1.0000 mg | INTRAMUSCULAR | Status: DC | PRN
  Administered 2012-03-24: 1 mg via INTRAVENOUS
  Filled 2012-03-24: qty 1

## 2012-03-24 MED ORDER — IOHEXOL 300 MG/ML  SOLN
100.0000 mL | Freq: Once | INTRAMUSCULAR | Status: AC | PRN
  Administered 2012-03-24: 100 mL via INTRAVENOUS

## 2012-03-24 MED ORDER — DIPHENHYDRAMINE HCL 50 MG/ML IJ SOLN
12.5000 mg | Freq: Once | INTRAMUSCULAR | Status: AC
  Administered 2012-03-24: 12.5 mg via INTRAVENOUS

## 2012-03-24 MED ORDER — SODIUM CHLORIDE 0.9 % IV SOLN
Freq: Once | INTRAVENOUS | Status: AC
  Administered 2012-03-24: via INTRAVENOUS

## 2012-03-24 MED ORDER — DEXTROSE-NACL 5-0.45 % IV SOLN
INTRAVENOUS | Status: AC
  Administered 2012-03-24: 04:00:00 via INTRAVENOUS

## 2012-03-24 MED ORDER — OXYCODONE-ACETAMINOPHEN 5-325 MG PO TABS: 1.0000 | ORAL_TABLET | ORAL | Status: DC | PRN

## 2012-03-24 MED ORDER — ONDANSETRON HCL 4 MG/2ML IJ SOLN: 4.0000 mg | Freq: Four times a day (QID) | INTRAMUSCULAR | Status: DC | PRN

## 2012-03-24 MED ORDER — DIPHENHYDRAMINE HCL 50 MG/ML IJ SOLN: 12.5000 mg | Freq: Four times a day (QID) | INTRAMUSCULAR | Status: DC | PRN

## 2012-03-24 MED ORDER — LACTATED RINGERS IV SOLN
INTRAVENOUS | Status: DC
  Administered 2012-03-24: 13:00:00 via INTRAVENOUS

## 2012-03-24 MED ORDER — DIPHENHYDRAMINE HCL 12.5 MG/5ML PO ELIX: 12.5000 mg | ORAL_SOLUTION | Freq: Four times a day (QID) | ORAL | Status: DC | PRN

## 2012-03-24 MED ORDER — DIPHENHYDRAMINE HCL 50 MG/ML IJ SOLN
INTRAMUSCULAR | Status: AC
  Administered 2012-03-24: 12.5 mg via INTRAVENOUS
  Filled 2012-03-24: qty 1

## 2012-03-24 MED ORDER — HYDROMORPHONE HCL PF 1 MG/ML IJ SOLN
1.0000 mg | INTRAMUSCULAR | Status: DC | PRN
  Administered 2012-03-24 (×2): 1 mg via INTRAVENOUS
  Filled 2012-03-24 (×2): qty 1

## 2012-03-24 MED ORDER — ONDANSETRON HCL 4 MG/2ML IJ SOLN: 4.0000 mg | Freq: Three times a day (TID) | INTRAMUSCULAR | Status: DC | PRN

## 2012-03-24 MED ORDER — HYDROMORPHONE HCL PF 1 MG/ML IJ SOLN
1.0000 mg | Freq: Once | INTRAMUSCULAR | Status: AC
  Administered 2012-03-24: 1 mg via INTRAVENOUS
  Filled 2012-03-24: qty 1

## 2012-03-24 NOTE — Progress Notes (Signed)
UR chart review completed.  

## 2012-03-24 NOTE — Discharge Summary (Signed)
Physician Discharge Summary  Patient ID: Kristen Dunn MRN: [id number] DOB/AGE: 1975-08-28 36 y.o.  Admit date: 03/23/2012 Discharge date: 03/24/2012  Admission Diagnoses:  Abdominal pain  Discharge Diagnoses: Same, resolved Active Problems:  * No active hospital problems. *    Discharged Condition: good  Hospital Course: Patient is a 36 year old white female who presented to the emergency room with worsening abdominal pain. 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 No ischemic injury to the bowel was noted. She was made to the hospital for control of her pain and nausea. Her abdominal pain resolved soon after her admission to the floor. Upon review of her CAT scan the several other radiologist, it was found that the findings were more consistent with her previous gastro-jejunostomy gastric bypass surgery. 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 Given the patient's abdominal pain had resolved, her diet was advanced at difficulty. She is being discharged home in good improving condition.  Significant Diagnostic Studies: CT scan of abdomen and pelvis  Discharge Exam: Blood pressure 115/72, pulse 59, temperature 97.8 F (36.6 C), temperature source Oral, resp. rate 20, height 5\' 5"  (1.651 m), weight 80.74 kg (178 lb), last menstrual period 02/23/2012, SpO2 100.00%, currently breastfeeding. General appearance: alert and cooperative Resp: clear to auscultation bilaterally Cardio: regular rate and rhythm, S1, S2 normal, no murmur, click, rub or gallop GI: soft, non-tender; bowel sounds normal; no masses,  no organomegaly  Disposition: 01-Home or Self Care     Medication List     As of 03/24/2012  3:57 PM    TAKE these medications         albuterol 108 (90 BASE) MCG/ACT inhaler   Commonly known as: PROVENTIL HFA;VENTOLIN HFA   Inhale 1 puff into the  lungs every 6 (six) hours as needed. Patient states she very seldom has to use, but she does keep one on hand      calcium citrate-vitamin D 200-200 MG-UNIT Tabs   Take 1 tablet by mouth daily.      loratadine 10 MG tablet   Commonly known as: CLARITIN   Take 10 mg by mouth daily as needed. For allergies      multivitamin-prenatal 27-0.8 MG Tabs   Take 1 tablet by mouth daily.      norethindrone 0.35 MG tablet   Commonly known as: MICRONOR,CAMILA,ERRIN   Take 1 tablet by mouth daily.      oxyCODONE-acetaminophen 5-325 MG per tablet   Commonly known as: PERCOCET/ROXICET   Take 1-2 tablets by mouth every 4 (four) hours as needed (moderate - severe pain).      oxyCODONE-acetaminophen 5-325 MG per tablet   Commonly known as: PERCOCET/ROXICET   Take 1-2 tablets by mouth every 4 (four) hours as needed (moderate - severe pain).           Follow-up Information    Follow up with HAWKINS,EDWARD L, MD. (As needed)    Contact information:   [address] [place] [address] [phone]          Signed: Franky Macho A 03/24/2012, 3:57 PM

## 2012-03-24 NOTE — ED Provider Notes (Signed)
History     CSN: [id number]  Arrival date & time 03/23/12  2327   First MD Initiated Contact with Patient 03/23/12 2334      Chief Complaint  Patient presents with  . Abdominal Pain    (Consider location/radiation/quality/duration/timing/severity/associated sxs/prior treatment) HPI Comments: Kristen Dunn presents with sudden onset of periumbilical sharp waxing and waning pain which started at 5 pm tonight.  She denies fevers, nausea, vomiting, constipation and diarrhea,  But does state for the past several weeks she has had multiple formed stools per day triggered by meals which is atypical of her bowel habits, but now has not had a bm since yesterday.  She took a stool softener today which has not resulted in a bm.  Her pain is constant with frequent sharp pain,  Sometimes with radiation to her back and is worse when she tries to lie flat or stand or sit upright.  She denies dysuria and hematuria.  She is 2 months postpartum and had a c section delivery secondary to prolonged labor after planned induction. Her past medical history is also significant for a Roux -En Y surgery in 2008.   Patient is a 36 y.o. female presenting with abdominal pain. The history is provided by the patient.  Abdominal Pain The primary symptoms of the illness include abdominal pain. The primary symptoms of the illness do not include fever, shortness of breath, nausea, vomiting or vaginal discharge.  Symptoms associated with the illness do not include chills.    Past Medical History  Diagnosis Date  . Asthma   . Anemia   . AMA (advanced maternal age) multigravida 35+   . Gestational diabetes     glyburide    Past Surgical History  Procedure Date  . Gastric bypass 2008   . Tonsillectomy   . Cesarean section 01/13/2012    Procedure: CESAREAN SECTION;  Surgeon: Catalina Antigua, MD;  Location: [facility];  Service: Obstetrics;  Laterality: N/A;    Family History  Problem Relation Age of Onset  . COPD Mother    . Asthma Mother   . Hypertension Father   . Aneurysm Father   . Asthma Sister   . Thyroid disease Maternal Grandmother   . Stroke Maternal Grandmother   . Von Willebrand disease Cousin     History  Substance Use Topics  . Smoking status: Never Smoker   . Smokeless tobacco: Never Used  . Alcohol Use: No    OB History    Grav Para Term Preterm Abortions TAB SAB Ect Mult Living   1 1 1  0 0 0 0 0 0 1      Review of Systems  Constitutional: Negative for fever and chills.  HENT: Negative for congestion, sore throat and neck pain.   Eyes: Negative.   Respiratory: Negative for chest tightness and shortness of breath.   Cardiovascular: Negative for chest pain.  Gastrointestinal: Positive for abdominal pain. Negative for nausea, vomiting, blood in stool and abdominal distention.  Genitourinary: Negative.  Negative for flank pain, vaginal discharge, difficulty urinating and pelvic pain.  Musculoskeletal: Negative for joint swelling and arthralgias.  Skin: Negative.  Negative for rash and wound.  Neurological: Negative for dizziness, weakness, light-headedness, numbness and headaches.  Hematological: Negative.   Psychiatric/Behavioral: Negative.     Allergies  Penicillins; Alupent; Lactose intolerance (gi); and Latex  Home Medications   Current Outpatient Rx  Name  Route  Sig  Dispense  Refill  . CALCIUM CITRATE-VITAMIN D 200-200 MG-UNIT  PO TABS   Oral   Take 1 tablet by mouth daily.         Marland Kitchen LORATADINE 10 MG PO TABS   Oral   Take 10 mg by mouth daily as needed. For allergies         . PRENATAL 27-0.8 MG PO TABS   Oral   Take 1 tablet by mouth daily.         . OXYCODONE-ACETAMINOPHEN 5-325 MG PO TABS   Oral   Take 1-2 tablets by mouth every 4 (four) hours as needed (moderate - severe pain).   30 tablet   0     BP 120/95  Pulse 69  Temp 98 F (36.7 C) (Oral)  Resp 22  Ht 5\' 5"  (1.651 m)  Wt 178 lb (80.74 kg)  BMI 29.62 kg/m2  SpO2 98%   Breastfeeding? Yes  Physical Exam  Nursing note and vitals reviewed. Constitutional: She appears well-developed and well-nourished.  HENT:  Head: Normocephalic and atraumatic.  Eyes: Conjunctivae normal are normal.  Neck: Normal range of motion.  Cardiovascular: Normal rate, regular rhythm, normal heart sounds and intact distal pulses.   Pulmonary/Chest: Effort normal and breath sounds normal. She has no wheezes.  Abdominal: Soft. Bowel sounds are normal. There is tenderness in the periumbilical area. There is no rigidity, no rebound, no guarding, no CVA tenderness and negative Murphy's sign.  Musculoskeletal: Normal range of motion.  Neurological: She is alert.  Skin: Skin is warm and dry.  Psychiatric: She has a normal mood and affect.    ED Course  Procedures (including critical care time)  Labs Reviewed  CBC WITH DIFFERENTIAL - Abnormal; Notable for the following:    Hemoglobin 10.3 (*)     HCT 33.2 (*)     MCH 25.7 (*)     RDW 18.2 (*)     All other components within normal limits  URINALYSIS, ROUTINE W REFLEX MICROSCOPIC - Abnormal; Notable for the following:    Specific Gravity, Urine >1.030 (*)     Ketones, ur 15 (*)     All other components within normal limits  COMPREHENSIVE METABOLIC PANEL  LIPASE, BLOOD  POCT PREGNANCY, URINE  PREGNANCY, URINE  URINALYSIS, ROUTINE W REFLEX MICROSCOPIC   No results found.   No diagnosis found.    MDM  Sudden onset of periumbilical pain with abdominal history complicated by gastric bypass,  2 months post partum.  Pt is breast feeding,  (baby also given supplement).  She does have gallbladder,  With sudden onset pain with radiation to back, could be acute cholecystitis, although labs at this point are normal.  Pending Ct scan.  Pt discussed with Dr Lynelle Doctor who will dispo pt once CT completed.        Burgess Amor, PA 03/24/12 [phone]

## 2012-03-24 NOTE — Plan of Care (Signed)
Problem: Discharge Progression Outcomes Goal: Other Discharge Outcomes/Goals Outcome: Completed/Met Date Met:  03/24/12 Discharge instructions read to pt.  She verbalized understanding of all instructions

## 2012-03-24 NOTE — ED Notes (Signed)
Patient states she is feeling somewhat better now, rates her pain 5/10.  Awaiting CT scan currently.

## 2012-03-24 NOTE — ED Notes (Signed)
Complains of upper extremities itching, MD notified.

## 2012-03-24 NOTE — ED Notes (Addendum)
Pain was only improved briefly, now increased.  MD made aware.

## 2012-03-24 NOTE — H&P (Signed)
Kristen Dunn is an 36 y.o. female.   Chief Complaint: Abdominal pain HPI: Patient is a 36 year old white female who presented yesterday evening with worsening mid abdominal pain. She also states she was nauseated, but did not have an episode of emesis. She is status post C-section 2 months ago. She states she has been doing okay, with mild indigestion, but no episodes of emesis. Last night, she suddenly had acute onset of abdominal pain and nausea. She presented the emergency room where a CT scan of the abdomen revealed possible small bowel volvulus with some venous congestion. There did not appear to be any loss of bowel wall integrity or evidence of ischemia to the small bowel. She has had gastric bypass in the past and lost 120 pounds. Soon after admission, her abdominal pain resolved. By the time I saw her, her abdominal pain had fully resolved.  Past Medical History  Diagnosis Date  . Asthma   . Anemia   . AMA (advanced maternal age) multigravida 35+   . Gestational diabetes     glyburide    Past Surgical History  Procedure Date  . Gastric bypass 2008   . Tonsillectomy   . Cesarean section 01/13/2012    Procedure: CESAREAN SECTION;  Surgeon: Catalina Antigua, MD;  Location: [facility];  Service: Obstetrics;  Laterality: N/A;    Family History  Problem Relation Age of Onset  . COPD Mother   . Asthma Mother   . Hypertension Father   . Aneurysm Father   . Asthma Sister   . Thyroid disease Maternal Grandmother   . Stroke Maternal Grandmother   . Von Willebrand disease Cousin    Social History:  reports that she has never smoked. She has never used smokeless tobacco. She reports that she does not drink alcohol or use illicit drugs.  Allergies:  Allergies  Allergen Reactions  . Penicillins Anaphylaxis    Reaction occurred as a very small child; anaphylaxis likely but not completely certain  . Alupent (Metaproterenol) Palpitations and Other (See Comments)    hyperadrenergic tremor   . Lactose Intolerance (Gi) Diarrhea  . Latex Itching and Dermatitis    Medications Prior to Admission  Medication Sig Dispense Refill  . calcium citrate-vitamin D 200-200 MG-UNIT TABS Take 1 tablet by mouth daily.      Marland Kitchen loratadine (CLARITIN) 10 MG tablet Take 10 mg by mouth daily as needed. For allergies      . Prenatal Vit-Fe Fumarate-FA (MULTIVITAMIN-PRENATAL) 27-0.8 MG TABS Take 1 tablet by mouth daily.      Marland Kitchen oxyCODONE-acetaminophen (PERCOCET/ROXICET) 5-325 MG per tablet Take 1-2 tablets by mouth every 4 (four) hours as needed (moderate - severe pain).  30 tablet  0    Results for orders placed during the hospital encounter of 03/23/12 (from the past 48 hour(s))  CBC WITH DIFFERENTIAL     Status: Abnormal   Collection Time   03/23/12 11:57 PM      Component Value Range Comment   WBC 5.5  4.0 - 10.5 K/uL    RBC 4.01  3.87 - 5.11 MIL/uL    Hemoglobin 10.3 (*) 12.0 - 15.0 g/dL    HCT 62.9 (*) 52.8 - 46.0 %    MCV 82.8  78.0 - 100.0 fL    MCH 25.7 (*) 26.0 - 34.0 pg    MCHC 31.0  30.0 - 36.0 g/dL    RDW 41.3 (*) 24.4 - 15.5 %    Platelets 327  150 - 400  K/uL    Neutrophils Relative 51  43 - 77 %    Neutro Abs 2.8  1.7 - 7.7 K/uL    Lymphocytes Relative 39  12 - 46 %    Lymphs Abs 2.1  0.7 - 4.0 K/uL    Monocytes Relative 7  3 - 12 %    Monocytes Absolute 0.4  0.1 - 1.0 K/uL    Eosinophils Relative 2  0 - 5 %    Eosinophils Absolute 0.1  0.0 - 0.7 K/uL    Basophils Relative 1  0 - 1 %    Basophils Absolute 0.1  0.0 - 0.1 K/uL   COMPREHENSIVE METABOLIC PANEL     Status: Normal   Collection Time   03/23/12 11:57 PM      Component Value Range Comment   Sodium 141  135 - 145 mEq/L    Potassium 4.2  3.5 - 5.1 mEq/L    Chloride 105  96 - 112 mEq/L    CO2 25  19 - 32 mEq/L    Glucose, Bld 98  70 - 99 mg/dL    BUN 9  6 - 23 mg/dL    Creatinine, Ser 4.09  0.50 - 1.10 mg/dL    Calcium 9.8  8.4 - 81.1 mg/dL    Total Protein 6.9  6.0 - 8.3 g/dL    Albumin 4.0  3.5 - 5.2 g/dL     AST 19  0 - 37 U/L    ALT 13  0 - 35 U/L    Alkaline Phosphatase 81  39 - 117 U/L    Total Bilirubin 0.8  0.3 - 1.2 mg/dL    GFR calc non Af Amer >90  >90 mL/min    GFR calc Af Amer >90  >90 mL/min   LIPASE, BLOOD     Status: Normal   Collection Time   03/23/12 11:57 PM      Component Value Range Comment   Lipase 24  11 - 59 U/L   POCT PREGNANCY, URINE     Status: Normal   Collection Time   03/24/12 12:04 AM      Component Value Range Comment   Preg Test, Ur NEGATIVE  NEGATIVE   URINALYSIS, ROUTINE W REFLEX MICROSCOPIC     Status: Abnormal   Collection Time   03/24/12 12:19 AM      Component Value Range Comment   Color, Urine YELLOW  YELLOW    APPearance CLEAR  CLEAR    Specific Gravity, Urine >1.030 (*) 1.005 - 1.030    pH 5.5  5.0 - 8.0    Glucose, UA NEGATIVE  NEGATIVE mg/dL    Hgb urine dipstick NEGATIVE  NEGATIVE    Bilirubin Urine NEGATIVE  NEGATIVE    Ketones, ur 15 (*) NEGATIVE mg/dL    Protein, ur NEGATIVE  NEGATIVE mg/dL    Urobilinogen, UA 0.2  0.0 - 1.0 mg/dL    Nitrite NEGATIVE  NEGATIVE    Leukocytes, UA NEGATIVE  NEGATIVE MICROSCOPIC NOT DONE ON URINES WITH NEGATIVE PROTEIN, BLOOD, LEUKOCYTES, NITRITE, OR GLUCOSE <1000 mg/dL.  PREGNANCY, URINE     Status: Normal   Collection Time   03/24/12 12:19 AM      Component Value Range Comment   Preg Test, Ur NEGATIVE  NEGATIVE    Ct Abdomen Pelvis W Contrast  03/24/2012  *RADIOLOGY REPORT*  Clinical Data: Abdominal pain.  CT ABDOMEN AND PELVIS WITH CONTRAST  Technique:  Multidetector CT imaging of the  abdomen and pelvis was performed following the standard protocol during bolus administration of intravenous contrast.  Contrast: OMNIPAQUE IOHEXOL 300 MG/ML  SOLN  Comparison: Abdominal ultrasound performed 02/19/2009  Findings: The visualized lung bases are clear.  There is twisting of the small bowel mesentery, with loss of visualization of the superior mesenteric vein as it twists about the arterial structures.   Associated significant venous congestion is noted within the more distal mesentery, with diffuse mesenteric edema.  The associated small bowel loops do not yet demonstrate significant evidence to suggest ischemia.  Findings are compatible with acute small bowel volvulus.  A tiny 0.6 cm hypodensity within the right hepatic lobe is nonspecific but may reflect a small cyst.  The spleen is grossly unremarkable in appearance.  The pancreas and adrenal glands are unremarkable.  The liver and spleen are unremarkable in appearance.  The gallbladder is within normal limits.  The pancreas and adrenal glands are unremarkable.  The kidneys are unremarkable in appearance.  There is no evidence of hydronephrosis.  No renal or ureteral stones are seen.  No perinephric stranding is appreciated.  No free fluid is identified.  The gastrojejunal anastomosis is unremarkable in appearance.  The distal stomach contains a small amount of fluid, and is unremarkable in appearance.  No acute vascular abnormalities are seen.  The appendix is grossly unremarkable in appearance, without evidence for appendicitis.  The colon is partially filled with stool, and is unremarkable in appearance.  The bladder is mildly distended and grossly unremarkable in appearance.  The uterus is within normal limits.  A 3.5 cm left adnexal cystic lesion is nonspecific in appearance.  The ovaries are otherwise unremarkable.  No inguinal lymphadenopathy is seen.  No acute osseous abnormalities are identified.  IMPRESSION:  1.  Twisting of the small bowel mesentery, with loss of visualization of a segment of the superior mesenteric vein as it twists about the arterial structures.  Occlusion of the superior mesenteric vein is a concern; there is significant associated venous congestion and edema within the mesentery.  The underlying small bowel is still grossly unremarkable in appearance, without definite evidence to suggest ischemia.  Findings compatible with small  bowel volvulus. 2.  Likely tiny hepatic cyst noted. 3.  Gastrojejunal anastomosis is unremarkable in appearance. 4.  3.5 cm left adnexal cystic lesion is nonspecific; pelvic ultrasound would be helpful for further evaluation, on an elective non-emergent basis.  These results were called by telephone on 03/24/2012 at 02:49 a.m. to Dr. Linwood Dibbles, who verbally acknowledged these results.   Original Report Authenticated By: Tonia Ghent, M.D.     Review of Systems  Constitutional: Negative.   HENT: Negative.   Eyes: Negative.   Respiratory: Negative.   Cardiovascular: Negative.   Gastrointestinal: Positive for nausea and abdominal pain.  Genitourinary: Negative.   Musculoskeletal: Negative.   Skin: Negative.   Neurological: Negative.   Endo/Heme/Allergies: Negative.     Blood pressure 125/66, pulse 57, temperature 97.8 F (36.6 C), temperature source Oral, resp. rate 16, height 5\' 5"  (1.651 m), weight 80.74 kg (178 lb), last menstrual period 02/23/2012, SpO2 96.00%, currently breastfeeding. Physical Exam  Constitutional: She is oriented to person, place, and time. She appears well-developed and well-nourished.  HENT:  Head: Normocephalic and atraumatic.  Neck: Normal range of motion. Neck supple.  Cardiovascular: Normal rate, regular rhythm and normal heart sounds.   Respiratory: Effort normal and breath sounds normal.  GI: Soft. Bowel sounds are normal. She exhibits no distension. There  is no rebound and no guarding.       Minimal tenderness in left side of abdomen.  Neurological: She is alert and oriented to person, place, and time.  Skin: Skin is warm and dry.  Psychiatric: She has a normal mood and affect. Her behavior is normal.     Assessment/Plan Impression: Small bowel volvulus, resolving. Clinically, the patient now states she is 100% better. Her laboratory tests are all within normal limits. She is hungry and wants to eat. Plan: Hard to justify emergent exploratory  laparotomy as patient is asymptomatic at this time. I did tell her that should her symptoms return, urgent surgical exploration would be warranted. We'll give her a regular diet and monitor her. She understands and agrees to the treatment plan.  Mars Scheaffer A 03/24/2012, 9:12 AM

## 2012-03-24 NOTE — ED Provider Notes (Signed)
Ct Abdomen Pelvis W Contrast  03/24/2012  *RADIOLOGY REPORT*  Clinical Data: Abdominal pain.  CT ABDOMEN AND PELVIS WITH CONTRAST  Technique:  Multidetector CT imaging of the abdomen and pelvis was performed following the standard protocol during bolus administration of intravenous contrast.  Contrast: OMNIPAQUE IOHEXOL 300 MG/ML  SOLN  Comparison: Abdominal ultrasound performed 02/19/2009  Findings: The visualized lung bases are clear.  There is twisting of the small bowel mesentery, with loss of visualization of the superior mesenteric vein as it twists about the arterial structures.  Associated significant venous congestion is noted within the more distal mesentery, with diffuse mesenteric edema.  The associated small bowel loops do not yet demonstrate significant evidence to suggest ischemia.  Findings are compatible with acute small bowel volvulus.  A tiny 0.6 cm hypodensity within the right hepatic lobe is nonspecific but may reflect a small cyst.  The spleen is grossly unremarkable in appearance.  The pancreas and adrenal glands are unremarkable.  The liver and spleen are unremarkable in appearance.  The gallbladder is within normal limits.  The pancreas and adrenal glands are unremarkable.  The kidneys are unremarkable in appearance.  There is no evidence of hydronephrosis.  No renal or ureteral stones are seen.  No perinephric stranding is appreciated.  No free fluid is identified.  The gastrojejunal anastomosis is unremarkable in appearance.  The distal stomach contains a small amount of fluid, and is unremarkable in appearance.  No acute vascular abnormalities are seen.  The appendix is grossly unremarkable in appearance, without evidence for appendicitis.  The colon is partially filled with stool, and is unremarkable in appearance.  The bladder is mildly distended and grossly unremarkable in appearance.  The uterus is within normal limits.  A 3.5 cm left adnexal cystic lesion is nonspecific in  appearance.  The ovaries are otherwise unremarkable.  No inguinal lymphadenopathy is seen.  No acute osseous abnormalities are identified.  IMPRESSION:  1.  Twisting of the small bowel mesentery, with loss of visualization of a segment of the superior mesenteric vein as it twists about the arterial structures.  Occlusion of the superior mesenteric vein is a concern; there is significant associated venous congestion and edema within the mesentery.  The underlying small bowel is still grossly unremarkable in appearance, without definite evidence to suggest ischemia.  Findings compatible with small bowel volvulus. 2.  Likely tiny hepatic cyst noted. 3.  Gastrojejunal anastomosis is unremarkable in appearance. 4.  3.5 cm left adnexal cystic lesion is nonspecific; pelvic ultrasound would be helpful for further evaluation, on an elective non-emergent basis.  These results were called by telephone on 03/24/2012 at 02:49 a.m. to Dr. Linwood Dibbles, who verbally acknowledged these results.   Original Report Authenticated By: Tonia Ghent, M.D.     CT scan shows above findings.  Discussed findings with patient.  Her pain is better but still persists.  Will consult with general surgery on call.  3:17 AM Discussed with Dr Lovell Sheehan.  Pt will be admitted to the hospital for further evaluation.  Holding orders written.   Medical screening examination/treatment/procedure(s) were conducted as a shared visit with non-physician practitioner(s) and myself.  I personally evaluated the patient during the encounter   Celene Kras, MD 03/24/12 [phone]

## 2012-03-25 NOTE — Progress Notes (Signed)
UR chart review completed.  

## 2012-03-26 ENCOUNTER — Emergency Department (HOSPITAL_COMMUNITY): Payer: BC Managed Care – PPO

## 2012-03-26 ENCOUNTER — Emergency Department (HOSPITAL_COMMUNITY)
Admission: EM | Admit: 2012-03-26 | Discharge: 2012-03-27 | Disposition: A | Discharge status: Home / Self Care | Payer: BC Managed Care – PPO | Attending: Emergency Medicine | Admitting: Emergency Medicine

## 2012-03-26 ENCOUNTER — Encounter (HOSPITAL_COMMUNITY): Payer: Self-pay | Admitting: *Deleted

## 2012-03-26 DIAGNOSIS — R1084 Generalized abdominal pain: Secondary | ICD-10-CM | POA: Insufficient documentation

## 2012-03-26 DIAGNOSIS — R109 Unspecified abdominal pain: Secondary | ICD-10-CM

## 2012-03-26 DIAGNOSIS — Z79899 Other long term (current) drug therapy: Secondary | ICD-10-CM | POA: Insufficient documentation

## 2012-03-26 DIAGNOSIS — D649 Anemia, unspecified: Secondary | ICD-10-CM | POA: Insufficient documentation

## 2012-03-26 DIAGNOSIS — Z8632 Personal history of gestational diabetes: Secondary | ICD-10-CM | POA: Insufficient documentation

## 2012-03-26 DIAGNOSIS — J45909 Unspecified asthma, uncomplicated: Secondary | ICD-10-CM | POA: Insufficient documentation

## 2012-03-26 LAB — CBC WITH DIFFERENTIAL/PLATELET
Basophils Absolute: 0.1 10*3/uL (ref 0.0–0.1)
Basophils Relative: 2 % — ABNORMAL HIGH (ref 0–1)
Eosinophils Absolute: 0.2 10*3/uL (ref 0.0–0.7)
Eosinophils Relative: 3 % (ref 0–5)
HCT: 35.3 % — ABNORMAL LOW (ref 36.0–46.0)
Hemoglobin: 10.9 g/dL — ABNORMAL LOW (ref 12.0–15.0)
Lymphocytes Relative: 43 % (ref 12–46)
Lymphs Abs: 2.5 10*3/uL (ref 0.7–4.0)
MCH: 25.3 pg — ABNORMAL LOW (ref 26.0–34.0)
MCHC: 30.9 g/dL (ref 30.0–36.0)
MCV: 81.9 fL (ref 78.0–100.0)
Monocytes Absolute: 0.4 10*3/uL (ref 0.1–1.0)
Monocytes Relative: 7 % (ref 3–12)
Neutro Abs: 2.7 10*3/uL (ref 1.7–7.7)
Neutrophils Relative %: 45 % (ref 43–77)
Platelets: 345 10*3/uL (ref 150–400)
RBC: 4.31 MIL/uL (ref 3.87–5.11)
RDW: 17.5 % — ABNORMAL HIGH (ref 11.5–15.5)
WBC: 5.9 10*3/uL (ref 4.0–10.5)

## 2012-03-26 LAB — URINALYSIS, ROUTINE W REFLEX MICROSCOPIC
Bilirubin Urine: NEGATIVE
Ketones, ur: NEGATIVE mg/dL
Nitrite: NEGATIVE
Specific Gravity, Urine: 1.022 (ref 1.005–1.030)
Urobilinogen, UA: 1 mg/dL (ref 0.0–1.0)
pH: 5.5 (ref 5.0–8.0)

## 2012-03-26 LAB — COMPREHENSIVE METABOLIC PANEL
ALT: 13 U/L (ref 0–35)
AST: 21 U/L (ref 0–37)
Albumin: 4 g/dL (ref 3.5–5.2)
Alkaline Phosphatase: 82 U/L (ref 39–117)
BUN: 10 mg/dL (ref 6–23)
CO2: 25 mEq/L (ref 19–32)
Calcium: 9.8 mg/dL (ref 8.4–10.5)
Chloride: 103 mEq/L (ref 96–112)
Creatinine, Ser: 0.69 mg/dL (ref 0.50–1.10)
GFR calc Af Amer: >90 mL/min (ref >90)
GFR calc non Af Amer: >90 mL/min (ref >90)
Glucose, Bld: 91 mg/dL (ref 70–99)
Potassium: 4.4 mEq/L (ref 3.5–5.1)
Sodium: 139 mEq/L (ref 135–145)
Total Bilirubin: 0.9 mg/dL (ref 0.3–1.2)
Total Protein: 7.2 g/dL (ref 6.0–8.3)

## 2012-03-26 MED ORDER — OXYCODONE-ACETAMINOPHEN 5-325 MG PO TABS
1.0000 | ORAL_TABLET | Freq: Once | ORAL | Status: AC
  Administered 2012-03-26: 1 via ORAL
  Filled 2012-03-26 (×2): qty 1

## 2012-03-26 MED ORDER — IOHEXOL 300 MG/ML  SOLN
20.0000 mL | INTRAMUSCULAR | Status: AC
  Administered 2012-03-26 (×2): 20 mL via ORAL

## 2012-03-26 MED ORDER — IOHEXOL 300 MG/ML  SOLN
100.0000 mL | Freq: Once | INTRAMUSCULAR | Status: AC | PRN
  Administered 2012-03-26: 100 mL via INTRAVENOUS

## 2012-03-26 NOTE — ED Notes (Signed)
Given room to pump breast milk

## 2012-03-26 NOTE — ED Provider Notes (Signed)
History    36 year old female with abdominal pain. Patient has a history of a Roux-en-Y gastric bypass done in 2008. On Tuesday she began having abdominal pain. She presented to the emergency room at [facility] and had CT abdomen pelvis which is concerning for possible volvulus. She was admitted and treated symptomatically with advancement of her diet and improvement in her symptoms. Seems that felt that initial CT impression may have actually been related to her previous bypass. Patient returned today because of persistent pain. Pain is diffuse and worse very shortly after eating. Associated with some mild nausea but no vomiting. No urinary complaints. No unusual vaginal bleeding or discharge. Patient is status post C-section approximately 2 months ago. She states that she has been healing well since then.  Denies previous problems since her bypass.  CSN: [id number]  Arrival date & time 03/26/12  1448   First MD Initiated Contact with Patient 03/26/12 2109      Chief Complaint  Patient presents with  . Abdominal Pain    (Consider location/radiation/quality/duration/timing/severity/associated sxs/prior treatment) HPI  Past Medical History  Diagnosis Date  . Asthma   . Anemia   . AMA (advanced maternal age) multigravida 35+   . Gestational diabetes     glyburide    Past Surgical History  Procedure Date  . Gastric bypass 2008   . Tonsillectomy   . Cesarean section 01/13/2012    Procedure: CESAREAN SECTION;  Surgeon: Catalina Antigua, MD;  Location: [facility];  Service: Obstetrics;  Laterality: N/A;    Family History  Problem Relation Age of Onset  . COPD Mother   . Asthma Mother   . Hypertension Father   . Aneurysm Father   . Asthma Sister   . Thyroid disease Maternal Grandmother   . Stroke Maternal Grandmother   . Von Willebrand disease Cousin     History  Substance Use Topics  . Smoking status: Never Smoker   . Smokeless tobacco: Never Used  . Alcohol Use: No    OB History     Grav Para Term Preterm Abortions TAB SAB Ect Mult Living   1 1 1  0 0 0 0 0 0 1      Review of Systems   Review of symptoms negative unless otherwise noted in HPI.   Allergies  Penicillins; Aspirin; Alupent; Lactose intolerance (gi); and Latex  Home Medications   Current Outpatient Rx  Name  Route  Sig  Dispense  Refill  . ALBUTEROL SULFATE HFA 108 (90 BASE) MCG/ACT IN AERS   Inhalation   Inhale 1 puff into the lungs every 6 (six) hours as needed. Patient states she very seldom has to use, but she does keep one on hand         . CALCIUM CITRATE-VITAMIN D 200-200 MG-UNIT PO TABS   Oral   Take 1 tablet by mouth daily.         Marland Kitchen LORATADINE 10 MG PO TABS   Oral   Take 10 mg by mouth daily as needed. For allergies         . NORETHINDRONE 0.35 MG PO TABS   Oral   Take 1 tablet by mouth daily.         . OXYCODONE-ACETAMINOPHEN 5-325 MG PO TABS   Oral   Take 1-2 tablets by mouth every 4 (four) hours as needed. For pain         . PRENATAL 27-0.8 MG PO TABS   Oral   Take 1 tablet  by mouth daily.           BP 121/88  Pulse 83  Temp 98.5 F (36.9 C) (Oral)  Resp 18  SpO2 96%  LMP 02/23/2012  Physical Exam  Nursing note and vitals reviewed. Constitutional: She appears well-developed and well-nourished. No distress.  HENT:  Head: Normocephalic and atraumatic.  Eyes: Conjunctivae normal are normal. Right eye exhibits no discharge. Left eye exhibits no discharge.  Neck: Neck supple.  Cardiovascular: Normal rate, regular rhythm and normal heart sounds.  Exam reveals no gallop and no friction rub.   No murmur heard. Pulmonary/Chest: Effort normal and breath sounds normal. No respiratory distress.  Abdominal: Soft. She exhibits no distension. There is tenderness. There is no rebound and no guarding.       Mild diffuse tenderness, but seems to be worse around the umbilicus. No rebound or guarding. No distention.  Musculoskeletal: She exhibits no edema and no  tenderness.  Neurological: She is alert.  Skin: Skin is warm and dry.  Psychiatric: She has a normal mood and affect. Her behavior is normal. Thought content normal.    ED Course  Procedures (including critical care time)  Labs Reviewed  URINALYSIS, ROUTINE W REFLEX MICROSCOPIC - Abnormal; Notable for the following:    APPearance HAZY (*)     Leukocytes, UA SMALL (*)     All other components within normal limits  URINE MICROSCOPIC-ADD ON - Abnormal; Notable for the following:    Squamous Epithelial / LPF FEW (*)     Bacteria, UA FEW (*)     All other components within normal limits  CBC WITH DIFFERENTIAL - Abnormal; Notable for the following:    Hemoglobin 10.9 (*)     HCT 35.3 (*)     MCH 25.3 (*)     RDW 17.5 (*)     Basophils Relative 2 (*)     All other components within normal limits  COMPREHENSIVE METABOLIC PANEL  URINALYSIS, ROUTINE W REFLEX MICROSCOPIC  URINE CULTURE   Ct Abdomen Pelvis W Contrast  03/26/2012  *RADIOLOGY REPORT*  Clinical Data: Abdominal pain.  Recently diagnosed with mesenteric volvulus.  CT ABDOMEN AND PELVIS WITH CONTRAST  Technique:  Multidetector CT imaging of the abdomen and pelvis was performed following the standard protocol during bolus administration of intravenous contrast.  Contrast: OMNIPAQUE IOHEXOL 300 MG/ML  SOLN  Comparison: 03/24/2012  Findings: The previously described twisting of the small bowel mesentery is not as obvious or well visualized on today's study. No definite swirling of vessels.  No small bowel changes of ischemia noted. There is mild edema/stranding within the central mesentery to the left of midline slight prominence of mesenteric lymph nodes.  The appearance suggests the possibility of internal hernia.  Small amount of free fluid in the pelvis.  No free air. Stable cyst in the left ovary, measuring 3.7 cm.  Moderate stool throughout the colon.  No evidence of bowel obstruction.  Appendix visualized and is normal.   Uterus, right adnexa and urinary bladder unremarkable.  Liver, gallbladder, spleen, pancreas, adrenals and kidneys are normal.  Postoperative changes are noted in the stomach from gastric bypass.  Lung bases are clear.  No effusions.  Heart is normal size.  No acute bony abnormality.  IMPRESSION: No current evidence for some mesenteric the twisting/volvulus. There continues to be mildly prominent mesenteric lymph nodes and mild stranding/edema within the mesentery just to the left of midline.  Given the patient's prior gastric bypass, question if  this may possibly represent an internal hernia.  No evidence for bowel obstruction or free air.  No ischemic changes within small bowel.  Small amount of free fluid in the pelvis.  Stable left ovarian cyst.  Changes of prior gastric bypass.   Original Report Authenticated By: Charlett Nose, M.D.      1. Abdominal pain       MDM  36 year old female with abdominal pain. Patient's second evaluation for same in the past week. No evidence of volvulus on today's CT. There are some changes in the mesentery suggestive of possible internal hernia though. Patient is non-peritoneal. No vomiting, distention or CT evidence to suggest obstruction. She reports that her pain is becoming worse since she was discharged though. Discussed with surgery, Dr Magnus Ivan to evaluate.       Raeford Razor, MD 03/27/12 [phone]

## 2012-03-26 NOTE — ED Notes (Signed)
Called patient to re-check vital signs; no answer.  Will try again.

## 2012-03-26 NOTE — ED Notes (Signed)
Pt. States seen on Tuesday at [facility] and was told she had a twisted bowel and was sent home with pain medicine. States physician told her they did not want to do surgery yet to see if it would resolve. Pt. States "I can't eat because every time I do I'm in extreme pain".

## 2012-03-26 NOTE — ED Notes (Signed)
To ED for eval of abd pain. Pt was just in [facility] with 'twisted bowel'. Pt was discharged 'without instruction' and now pain is severe after eating. States she has had one bm. No nausea or vomiting.

## 2012-03-26 NOTE — ED Notes (Signed)
Patient updated on wait time; apologized about delay.  Patient verbalized understanding; denies any changes in condition at this time.  Will continue to monitor.

## 2012-03-26 NOTE — ED Notes (Signed)
Dr. Kohut at bedside 

## 2012-03-27 DIAGNOSIS — R109 Unspecified abdominal pain: Secondary | ICD-10-CM

## 2012-03-27 LAB — URINE CULTURE

## 2012-03-27 MED ORDER — SODIUM CHLORIDE 0.9 % IV BOLUS (SEPSIS)
1000.0000 mL | Freq: Once | INTRAVENOUS | Status: AC
  Administered 2012-03-27: 1000 mL via INTRAVENOUS

## 2012-03-27 MED ORDER — MORPHINE SULFATE 4 MG/ML IJ SOLN
4.0000 mg | Freq: Once | INTRAMUSCULAR | Status: DC
  Filled 2012-03-27: qty 1

## 2012-03-27 MED ORDER — ONDANSETRON HCL 4 MG/2ML IJ SOLN
4.0000 mg | Freq: Once | INTRAMUSCULAR | Status: AC
  Administered 2012-03-27: 4 mg via INTRAVENOUS
  Filled 2012-03-27: qty 2

## 2012-03-27 NOTE — ED Notes (Signed)
Pt. Refused morphine at this time. Will reassess pain later.

## 2012-03-27 NOTE — ED Notes (Signed)
Surgery at bedside.

## 2012-03-27 NOTE — Consult Note (Signed)
Reason for Consult:abdominal pain Referring Physician: Dr. Unice Bailey is an 36 y.o. female.  HPI: This is a patient who had a laparoscopic Roux-en-Y gastric bypass by Dr. Johna Sheriff in 2008. She has done very well until this week. She started having moderate to severe cramping upper abdominal pain just after eating on December 3. She had a CAT scan done at [facility] which suggested an internal volvulus. She was admitted to the hospital and discharged the next day slightly improved. She is able to keep down liquids and has had no nausea or vomiting but continues to have pain when she eats. She is having bowel movements. She was concerned so she presented again to the emergency department. Currently she is pain free.  She had excellent results with her gastric bypass.  Past Medical History  Diagnosis Date  . Asthma   . Anemia   . AMA (advanced maternal age) multigravida 35+   . Gestational diabetes     glyburide    Past Surgical History  Procedure Date  . Gastric bypass 2008   . Tonsillectomy   . Cesarean section 01/13/2012    Procedure: CESAREAN SECTION;  Surgeon: Catalina Antigua, MD;  Location: [facility];  Service: Obstetrics;  Laterality: N/A;    Family History  Problem Relation Age of Onset  . COPD Mother   . Asthma Mother   . Hypertension Father   . Aneurysm Father   . Asthma Sister   . Thyroid disease Maternal Grandmother   . Stroke Maternal Grandmother   . Von Willebrand disease Cousin     Social History:  reports that she has never smoked. She has never used smokeless tobacco. She reports that she does not drink alcohol or use illicit drugs.  Allergies:  Allergies  Allergen Reactions  . Penicillins Anaphylaxis    Reaction occurred as a very small child; anaphylaxis likely but not completely certain  . Aspirin     Intolerance because of gastric bypass surgery  . Alupent (Metaproterenol) Palpitations and Other (See Comments)    hyperadrenergic tremor  .  Lactose Intolerance (Gi) Diarrhea  . Latex Itching and Dermatitis    Medications: I have reviewed the patient's current medications.  Results for orders placed during the hospital encounter of 03/26/12 (from the past 48 hour(s))  URINALYSIS, ROUTINE W REFLEX MICROSCOPIC     Status: Abnormal   Collection Time   03/26/12  2:58 PM      Component Value Range Comment   Color, Urine YELLOW  YELLOW    APPearance HAZY (*) CLEAR    Specific Gravity, Urine 1.022  1.005 - 1.030    pH 5.5  5.0 - 8.0    Glucose, UA NEGATIVE  NEGATIVE mg/dL    Hgb urine dipstick NEGATIVE  NEGATIVE    Bilirubin Urine NEGATIVE  NEGATIVE    Ketones, ur NEGATIVE  NEGATIVE mg/dL    Protein, ur NEGATIVE  NEGATIVE mg/dL    Urobilinogen, UA 1.0  0.0 - 1.0 mg/dL    Nitrite NEGATIVE  NEGATIVE    Leukocytes, UA SMALL (*) NEGATIVE   URINE MICROSCOPIC-ADD ON     Status: Abnormal   Collection Time   03/26/12  2:58 PM      Component Value Range Comment   Squamous Epithelial / LPF FEW (*) RARE    WBC, UA 3-6  <3 WBC/hpf    Bacteria, UA FEW (*) RARE    Urine-Other MUCOUS PRESENT     CBC WITH  DIFFERENTIAL     Status: Abnormal   Collection Time   03/26/12  4:51 PM      Component Value Range Comment   WBC 5.9  4.0 - 10.5 K/uL    RBC 4.31  3.87 - 5.11 MIL/uL    Hemoglobin 10.9 (*) 12.0 - 15.0 g/dL    HCT 16.1 (*) 09.6 - 46.0 %    MCV 81.9  78.0 - 100.0 fL    MCH 25.3 (*) 26.0 - 34.0 pg    MCHC 30.9  30.0 - 36.0 g/dL    RDW 04.5 (*) 40.9 - 15.5 %    Platelets 345  150 - 400 K/uL    Neutrophils Relative 45  43 - 77 %    Neutro Abs 2.7  1.7 - 7.7 K/uL    Lymphocytes Relative 43  12 - 46 %    Lymphs Abs 2.5  0.7 - 4.0 K/uL    Monocytes Relative 7  3 - 12 %    Monocytes Absolute 0.4  0.1 - 1.0 K/uL    Eosinophils Relative 3  0 - 5 %    Eosinophils Absolute 0.2  0.0 - 0.7 K/uL    Basophils Relative 2 (*) 0 - 1 %    Basophils Absolute 0.1  0.0 - 0.1 K/uL   COMPREHENSIVE METABOLIC PANEL     Status: Normal   Collection Time    03/26/12  4:51 PM      Component Value Range Comment   Sodium 139  135 - 145 mEq/L    Potassium 4.4  3.5 - 5.1 mEq/L    Chloride 103  96 - 112 mEq/L    CO2 25  19 - 32 mEq/L    Glucose, Bld 91  70 - 99 mg/dL    BUN 10  6 - 23 mg/dL    Creatinine, Ser 8.11  0.50 - 1.10 mg/dL    Calcium 9.8  8.4 - 91.4 mg/dL    Total Protein 7.2  6.0 - 8.3 g/dL    Albumin 4.0  3.5 - 5.2 g/dL    AST 21  0 - 37 U/L    ALT 13  0 - 35 U/L    Alkaline Phosphatase 82  39 - 117 U/L    Total Bilirubin 0.9  0.3 - 1.2 mg/dL    GFR calc non Af Amer >90  >90 mL/min    GFR calc Af Amer >90  >90 mL/min     Ct Abdomen Pelvis W Contrast  03/26/2012  *RADIOLOGY REPORT*  Clinical Data: Abdominal pain.  Recently diagnosed with mesenteric volvulus.  CT ABDOMEN AND PELVIS WITH CONTRAST  Technique:  Multidetector CT imaging of the abdomen and pelvis was performed following the standard protocol during bolus administration of intravenous contrast.  Contrast: OMNIPAQUE IOHEXOL 300 MG/ML  SOLN  Comparison: 03/24/2012  Findings: The previously described twisting of the small bowel mesentery is not as obvious or well visualized on today's study. No definite swirling of vessels.  No small bowel changes of ischemia noted. There is mild edema/stranding within the central mesentery to the left of midline slight prominence of mesenteric lymph nodes.  The appearance suggests the possibility of internal hernia.  Small amount of free fluid in the pelvis.  No free air. Stable cyst in the left ovary, measuring 3.7 cm.  Moderate stool throughout the colon.  No evidence of bowel obstruction.  Appendix visualized and is normal.  Uterus, right adnexa and urinary bladder unremarkable.  Liver, gallbladder, spleen, pancreas, adrenals and kidneys are normal.  Postoperative changes are noted in the stomach from gastric bypass.  Lung bases are clear.  No effusions.  Heart is normal size.  No acute bony abnormality.  IMPRESSION: No current evidence for  some mesenteric the twisting/volvulus. There continues to be mildly prominent mesenteric lymph nodes and mild stranding/edema within the mesentery just to the left of midline.  Given the patient's prior gastric bypass, question if this may possibly represent an internal hernia.  No evidence for bowel obstruction or free air.  No ischemic changes within small bowel.  Small amount of free fluid in the pelvis.  Stable left ovarian cyst.  Changes of prior gastric bypass.   Original Report Authenticated By: Charlett Nose, M.D.     Review of Systems  Constitutional: Negative.   HENT: Negative.   Eyes: Negative.   Respiratory: Negative.   Cardiovascular: Negative.   Gastrointestinal: Positive for abdominal pain.  Genitourinary: Negative.   Musculoskeletal: Negative.   Skin: Negative.   Neurological: Negative.   Endo/Heme/Allergies: Negative.   Psychiatric/Behavioral: Negative.    Blood pressure 121/88, pulse 83, temperature 98.5 F (36.9 C), temperature source Oral, resp. rate 18, last menstrual period 02/23/2012, SpO2 96.00%. Physical Exam  Constitutional: She is oriented to person, place, and time. She appears well-developed and well-nourished. No distress.  HENT:  Head: Normocephalic and atraumatic.  Right Ear: External ear normal.  Left Ear: External ear normal.  Nose: Nose normal.  Mouth/Throat: Oropharynx is clear and moist. No oropharyngeal exudate.  Eyes: Conjunctivae normal are normal. Pupils are equal, round, and reactive to light. Right eye exhibits no discharge. Left eye exhibits no discharge. No scleral icterus.  Neck: Normal range of motion. Neck supple.  Cardiovascular: Normal rate, regular rhythm, normal heart sounds and intact distal pulses.   Respiratory: Effort normal and breath sounds normal. No respiratory distress. She has no wheezes.  GI: Soft. Bowel sounds are normal. She exhibits no distension. There is tenderness. There is guarding. There is no rebound.       Abdomen  is nondistended. It is soft. There is mild upper abdominal tenderness with guarding. There are no hernias  Neurological: She is alert and oriented to person, place, and time.  Skin: She is not diaphoretic.  Psychiatric: Her behavior is normal. Judgment normal.    Assessment/Plan: Abdominal pain of uncertain etiology  Her CAT scan today is improved from 3 days ago. She does feel better. This may have represented an internal volvulus which is now corrected itself. I gave her the option of being admitted to the hospital for IV rehydration and observation. She would rather go home and followup with our bariatric surgeons next week. She does not have an acute abdomen so I believe this is reasonable. She is able to keep down liquids.  She will call the office on Monday. She will call back sooner if her pain worsens.  Candler Ginsberg A 03/27/2012, 12:58 AM

## 2012-03-29 ENCOUNTER — Encounter (HOSPITAL_COMMUNITY): Payer: Self-pay | Admitting: *Deleted

## 2012-03-29 ENCOUNTER — Encounter (HOSPITAL_COMMUNITY): Payer: Self-pay | Admitting: Pharmacy Technician

## 2012-03-29 ENCOUNTER — Telehealth (INDEPENDENT_AMBULATORY_CARE_PROVIDER_SITE_OTHER): Payer: Self-pay | Admitting: General Surgery

## 2012-03-29 ENCOUNTER — Ambulatory Visit (INDEPENDENT_AMBULATORY_CARE_PROVIDER_SITE_OTHER): Payer: BC Managed Care – PPO | Admitting: General Surgery

## 2012-03-29 ENCOUNTER — Encounter (INDEPENDENT_AMBULATORY_CARE_PROVIDER_SITE_OTHER): Payer: Self-pay | Admitting: General Surgery

## 2012-03-29 ENCOUNTER — Telehealth (INDEPENDENT_AMBULATORY_CARE_PROVIDER_SITE_OTHER): Payer: Self-pay

## 2012-03-29 VITALS — BP 115/75 | HR 78 | Temp 98.8°F | Resp 18 | Ht 65.0 in | Wt 179.8 lb

## 2012-03-29 DIAGNOSIS — K458 Other specified abdominal hernia without obstruction or gangrene: Secondary | ICD-10-CM

## 2012-03-29 NOTE — Progress Notes (Signed)
Subjective:   abdominal pain  Patient ID: Kristen Dunn, female   DOB: 01/13/1976, 36 y.o.   MRN: [id number]  HPI Patient returns to the office with a history of laparoscopic Roux-en-Y gastric bypass for morbid obesity in 2008. She had done extremely well following her surgery with excellent maintained weight loss. She was in her usual state of health until 5 days ago when she had the fairly sudden onset of severe abdominal pain which she describes as sharp across her entire abdomen and radiating to the back. This was constant and severe and associated with nausea but no vomiting. She presented to the [facility]. A CT scan was performed which I reviewed which shows swirling of the small bowel mesentery and venous congestion and edema of the small intestine. She was admitted and observed and over a couple of days the pain improved although did not go away entirely. She had another episode not quite as severe 3 days ago and was seen in the emergency room here, improved and was discharged with instructions to followup. She currently feels significantly better although has some occasional sharp midabdominal pain when trying to eat or drink. His had constipation since onset of the pain but had a bowel movement today. She states she clearly feels better than she did 3 days ago. She has not had any similar previous episodes. No chronic GI complaints. She is about 2 months postpartum from a C-section.  Past Medical History  Diagnosis Date  . Asthma   . Anemia   . AMA (advanced maternal age) multigravida 35+   . Gestational diabetes     glyburide  . PONV (postoperative nausea and vomiting)    Past Surgical History  Procedure Date  . Gastric bypass 2008   . Tonsillectomy   . Cesarean section 01/13/2012    Procedure: CESAREAN SECTION;  Surgeon: Peggy Constant, MD;  Location: [facility];  Service: Obstetrics;  Laterality: N/A;   Current Outpatient Prescriptions  Medication Sig Dispense Refill  .  albuterol (PROVENTIL HFA;VENTOLIN HFA) 108 (90 BASE) MCG/ACT inhaler Inhale 1 puff into the lungs every 6 (six) hours as needed. Patient states she very seldom has to use, but she does keep one on hand      . docusate sodium (COLACE) 100 MG capsule Take 100 mg by mouth at bedtime.      . loratadine (CLARITIN) 10 MG tablet Take 10 mg by mouth daily as needed. For allergies      . magnesium hydroxide (MILK OF MAGNESIA) 400 MG/5ML suspension Take 15 mLs by mouth daily as needed. For constipation      . norethindrone (MICRONOR,CAMILA,ERRIN) 0.35 MG tablet Take 1 tablet by mouth at bedtime.       . oxyCODONE-acetaminophen (PERCOCET/ROXICET) 5-325 MG per tablet Take 1-2 tablets by mouth every 4 (four) hours as needed. For pain      . Prenatal Vit-Fe Fumarate-FA (MULTIVITAMIN-PRENATAL) 27-0.8 MG TABS Take 1 tablet by mouth daily.       Allergies  Allergen Reactions  . Penicillins Anaphylaxis    Reaction occurred as a very small child; anaphylaxis likely but not completely certain  . Aspirin     Intolerance because of gastric bypass surgery Asthma flares up  . Alupent (Metaproterenol) Palpitations and Other (See Comments)    hyperadrenergic tremor  . Lactose Intolerance (Gi) Diarrhea  . Latex Itching and Dermatitis   History  Substance Use Topics  . Smoking status: Never Smoker   . Smokeless tobacco: Never   Used  . Alcohol Use: No     Review of Systems  Constitutional: Negative for fever and chills.  Respiratory: Negative.   Cardiovascular: Negative.   Gastrointestinal: Positive for nausea, abdominal pain and constipation. Negative for vomiting and abdominal distention.  Genitourinary: Negative.        Objective:   Physical Exam BP 115/75  Pulse 78  Temp 98.8 F (37.1 C) (Temporal)  Resp 18  Ht 5' 5" (1.651 m)  Wt 179 lb 12.8 oz (81.557 kg)  BMI 29.92 kg/m2  LMP 02/23/2012 General: Well-appearing Caucasian female in no distress Skin: Warm and dry without rash or  infection HEENT: No palpable mass or thyromegaly. Sclera nonicteric Lymph nodes: No cervical, supraclavicular or inguinal nodes palpable Lungs: Clear equal breath sounds without increased work of breathing   Cardiovascular: Regular rate and rhythm. No edema Abdomen: Nondistended. Well-healed laparoscopic incisions. No hernias. There is mild tenderness across the upper and mid abdomen. No masses organomegaly appreciated. Extremities: No edema or joint swelling Neurologic: Alert and fully oriented. Gait normal.  Assessment:     Acute episode of severe abdominal pain last week with history of successful Roux-en-Y gastric bypass. CT scan clearly shows swirling of the mesentery and bowel edema consistent with an internal hernia. She fortunately has improved. She still has some mild tenderness. She will require laparoscopic exploration and probable repair of internal hernia. I discussed this with her including the indications and risks of anesthetic complications, bleeding, infection, bowel injury and possible need for open surgery. I scheduled this for the day after tomorrow if she clearly has improved and more recent CT scan shows resolution of the mesenteric twisting.    Plan:     Laparoscopic exploration and likely repair of internal hernia. The patient understands to call me immediately should she have any worsening symptoms prior to her surgery.      

## 2012-03-29 NOTE — Telephone Encounter (Signed)
Called pt re recent abd pain and CT findings.  I arranged to see her in the office this afternoon.

## 2012-03-29 NOTE — Telephone Encounter (Signed)
Patient called in needing appointment as soon as possible with [facility] for questionable hernia. She was seen at [facility] this weekend. 2nd CT report shows improvement with NO obstruction. [facility]'s discharge says to follow up this week with her bariatric surgeon. Can you find somewhere to put this patient on Thursday? She wants to be seen early in week but no bariatric surgeon is here.

## 2012-03-31 ENCOUNTER — Encounter (HOSPITAL_COMMUNITY): Payer: Self-pay | Admitting: Anesthesiology

## 2012-03-31 ENCOUNTER — Ambulatory Visit (HOSPITAL_COMMUNITY): Payer: BC Managed Care – PPO | Admitting: Anesthesiology

## 2012-03-31 ENCOUNTER — Ambulatory Visit (HOSPITAL_COMMUNITY)
Admission: RE | Admit: 2012-03-31 | Discharge: 2012-03-31 | Disposition: A | Discharge status: Home / Self Care | Payer: BC Managed Care – PPO | Source: Ambulatory Visit | Attending: General Surgery | Admitting: General Surgery

## 2012-03-31 ENCOUNTER — Encounter (HOSPITAL_COMMUNITY): Payer: Self-pay | Admitting: *Deleted

## 2012-03-31 ENCOUNTER — Encounter (HOSPITAL_COMMUNITY)
Admission: RE | Disposition: A | Discharge status: Home / Self Care | Payer: Self-pay | Source: Ambulatory Visit | Attending: General Surgery

## 2012-03-31 DIAGNOSIS — Y832 Surgical operation with anastomosis, bypass or graft as the cause of abnormal reaction of the patient, or of later complication, without mention of misadventure at the time of the procedure: Secondary | ICD-10-CM | POA: Insufficient documentation

## 2012-03-31 DIAGNOSIS — R110 Nausea: Secondary | ICD-10-CM | POA: Insufficient documentation

## 2012-03-31 DIAGNOSIS — D649 Anemia, unspecified: Secondary | ICD-10-CM | POA: Insufficient documentation

## 2012-03-31 DIAGNOSIS — R109 Unspecified abdominal pain: Secondary | ICD-10-CM | POA: Insufficient documentation

## 2012-03-31 DIAGNOSIS — J45909 Unspecified asthma, uncomplicated: Secondary | ICD-10-CM | POA: Insufficient documentation

## 2012-03-31 DIAGNOSIS — K9589 Other complications of other bariatric procedure: Secondary | ICD-10-CM | POA: Insufficient documentation

## 2012-03-31 DIAGNOSIS — K5900 Constipation, unspecified: Secondary | ICD-10-CM | POA: Insufficient documentation

## 2012-03-31 DIAGNOSIS — K458 Other specified abdominal hernia without obstruction or gangrene: Secondary | ICD-10-CM

## 2012-03-31 DIAGNOSIS — K469 Unspecified abdominal hernia without obstruction or gangrene: Secondary | ICD-10-CM

## 2012-03-31 DIAGNOSIS — Z79899 Other long term (current) drug therapy: Secondary | ICD-10-CM | POA: Insufficient documentation

## 2012-03-31 HISTORY — PX: INCISIONAL HERNIA REPAIR: SHX193

## 2012-03-31 HISTORY — DX: Other specified postprocedural states: R11.2

## 2012-03-31 HISTORY — DX: Other specified postprocedural states: Z98.890

## 2012-03-31 LAB — SURGICAL PCR SCREEN
MRSA, PCR: NEGATIVE
Staphylococcus aureus: NEGATIVE

## 2012-03-31 SURGERY — REPAIR, HERNIA, INCISIONAL, LAPAROSCOPIC
Anesthesia: General | Wound class: Clean

## 2012-03-31 MED ORDER — CIPROFLOXACIN IN D5W 400 MG/200ML IV SOLN
INTRAVENOUS | Status: AC
  Filled 2012-03-31: qty 200

## 2012-03-31 MED ORDER — ONDANSETRON HCL 4 MG/2ML IJ SOLN
INTRAMUSCULAR | Status: DC | PRN
  Administered 2012-03-31: 4 mg via INTRAVENOUS

## 2012-03-31 MED ORDER — CIPROFLOXACIN IN D5W 400 MG/200ML IV SOLN
400.0000 mg | INTRAVENOUS | Status: AC
  Administered 2012-03-31: 400 mg via INTRAVENOUS

## 2012-03-31 MED ORDER — FENTANYL CITRATE 0.05 MG/ML IJ SOLN
INTRAMUSCULAR | Status: DC | PRN
  Administered 2012-03-31 (×2): 50 ug via INTRAVENOUS
  Administered 2012-03-31: 100 ug via INTRAVENOUS
  Administered 2012-03-31 (×3): 50 ug via INTRAVENOUS

## 2012-03-31 MED ORDER — LIDOCAINE HCL (CARDIAC) 20 MG/ML IV SOLN
INTRAVENOUS | Status: DC | PRN
  Administered 2012-03-31: 60 mg via INTRAVENOUS

## 2012-03-31 MED ORDER — MIDAZOLAM HCL 5 MG/5ML IJ SOLN
INTRAMUSCULAR | Status: DC | PRN
  Administered 2012-03-31: 2 mg via INTRAVENOUS

## 2012-03-31 MED ORDER — OXYCODONE-ACETAMINOPHEN 5-325 MG PO TABS: 1.0000 | ORAL_TABLET | ORAL | Status: DC | PRN

## 2012-03-31 MED ORDER — ROCURONIUM BROMIDE 100 MG/10ML IV SOLN
INTRAVENOUS | Status: DC | PRN
  Administered 2012-03-31: 5 mg via INTRAVENOUS
  Administered 2012-03-31: 40 mg via INTRAVENOUS
  Administered 2012-03-31: 5 mg via INTRAVENOUS
  Administered 2012-03-31: 10 mg via INTRAVENOUS

## 2012-03-31 MED ORDER — BUPIVACAINE-EPINEPHRINE (PF) 0.5% -1:200000 IJ SOLN
INTRAMUSCULAR | Status: AC
  Filled 2012-03-31: qty 10

## 2012-03-31 MED ORDER — LACTATED RINGERS IV SOLN: INTRAVENOUS | Status: DC

## 2012-03-31 MED ORDER — ACETAMINOPHEN 10 MG/ML IV SOLN
INTRAVENOUS | Status: AC
  Filled 2012-03-31: qty 100

## 2012-03-31 MED ORDER — DEXAMETHASONE SODIUM PHOSPHATE 10 MG/ML IJ SOLN
INTRAMUSCULAR | Status: DC | PRN
  Administered 2012-03-31: 8 mg via INTRAVENOUS

## 2012-03-31 MED ORDER — CHLORHEXIDINE GLUCONATE 4 % EX LIQD
1.0000 "application " | Freq: Once | CUTANEOUS | Status: DC
  Filled 2012-03-31: qty 15

## 2012-03-31 MED ORDER — HYDROMORPHONE HCL PF 1 MG/ML IJ SOLN
INTRAMUSCULAR | Status: DC | PRN
  Administered 2012-03-31 (×2): .4 mg via INTRAVENOUS

## 2012-03-31 MED ORDER — HYDROMORPHONE HCL PF 1 MG/ML IJ SOLN
0.2500 mg | INTRAMUSCULAR | Status: DC | PRN
  Administered 2012-03-31 (×4): 0.25 mg via INTRAVENOUS

## 2012-03-31 MED ORDER — NEOSTIGMINE METHYLSULFATE 1 MG/ML IJ SOLN
INTRAMUSCULAR | Status: DC | PRN
  Administered 2012-03-31: 4 mg via INTRAVENOUS

## 2012-03-31 MED ORDER — MUPIROCIN 2 % EX OINT
TOPICAL_OINTMENT | Freq: Once | CUTANEOUS | Status: AC
  Administered 2012-03-31: 1 via NASAL
  Filled 2012-03-31 (×2): qty 22

## 2012-03-31 MED ORDER — PROPOFOL 10 MG/ML IV BOLUS
INTRAVENOUS | Status: DC | PRN
  Administered 2012-03-31: 160 mg via INTRAVENOUS

## 2012-03-31 MED ORDER — HYDROMORPHONE HCL PF 1 MG/ML IJ SOLN
INTRAMUSCULAR | Status: AC
  Filled 2012-03-31: qty 1

## 2012-03-31 MED ORDER — 0.9 % SODIUM CHLORIDE (POUR BTL) OPTIME
TOPICAL | Status: DC | PRN
  Administered 2012-03-31: 1000 mL

## 2012-03-31 MED ORDER — BUPIVACAINE-EPINEPHRINE 0.25% -1:200000 IJ SOLN
INTRAMUSCULAR | Status: DC | PRN
  Administered 2012-03-31: 26 mL

## 2012-03-31 MED ORDER — LACTATED RINGERS IV SOLN
INTRAVENOUS | Status: DC | PRN
  Administered 2012-03-31 (×2): via INTRAVENOUS

## 2012-03-31 MED ORDER — GLYCOPYRROLATE 0.2 MG/ML IJ SOLN
INTRAMUSCULAR | Status: DC | PRN
  Administered 2012-03-31: .6 mg via INTRAVENOUS

## 2012-03-31 MED ORDER — ACETAMINOPHEN 10 MG/ML IV SOLN
INTRAVENOUS | Status: DC | PRN
  Administered 2012-03-31: 1000 mg via INTRAVENOUS

## 2012-03-31 MED ORDER — OXYCODONE-ACETAMINOPHEN 5-325 MG PO TABS
1.0000 | ORAL_TABLET | ORAL | Status: DC | PRN
Start: 2012-03-31 — End: 2012-03-31
  Administered 2012-03-31: 1 via ORAL
  Filled 2012-03-31: qty 1

## 2012-03-31 SURGICAL SUPPLY — 57 items
ADH SKN CLS APL DERMABOND .7 (GAUZE/BANDAGES/DRESSINGS) ×1
APL SKNCLS STERI-STRIP NONHPOA (GAUZE/BANDAGES/DRESSINGS)
BENZOIN TINCTURE PRP APPL 2/3 (GAUZE/BANDAGES/DRESSINGS) IMPLANT
BINDER ABD UNIV 12 45-62 (WOUND CARE) IMPLANT
BINDER ABDOMINAL 46IN 62IN (WOUND CARE)
CANISTER SUCTION 2500CC (MISCELLANEOUS) ×2 IMPLANT
CHLORAPREP W/TINT 26ML (MISCELLANEOUS) ×2 IMPLANT
CLOTH BEACON ORANGE TIMEOUT ST (SAFETY) ×2 IMPLANT
DECANTER SPIKE VIAL GLASS SM (MISCELLANEOUS) ×2 IMPLANT
DERMABOND ADVANCED (GAUZE/BANDAGES/DRESSINGS) ×1
DERMABOND ADVANCED .7 DNX12 (GAUZE/BANDAGES/DRESSINGS) ×1 IMPLANT
DEVICE SUTURE ENDOST 10MM (ENDOMECHANICALS) ×2 IMPLANT
DEVICE TROCAR PUNCTURE CLOSURE (ENDOMECHANICALS) IMPLANT
DISSECTOR BLUNT TIP ENDO 5MM (MISCELLANEOUS) IMPLANT
DRAPE INCISE IOBAN 66X45 STRL (DRAPES) IMPLANT
DRAPE LAPAROSCOPIC ABDOMINAL (DRAPES) ×2 IMPLANT
DRAPE UTILITY XL STRL (DRAPES) ×2 IMPLANT
ELECT REM PT RETURN 9FT ADLT (ELECTROSURGICAL) ×2
ELECTRODE REM PT RTRN 9FT ADLT (ELECTROSURGICAL) ×1 IMPLANT
GLOVE BIOGEL PI IND STRL 7.0 (GLOVE) ×1 IMPLANT
GLOVE BIOGEL PI IND STRL 7.5 (GLOVE) ×3 IMPLANT
GLOVE BIOGEL PI INDICATOR 7.0 (GLOVE) ×1
GLOVE BIOGEL PI INDICATOR 7.5 (GLOVE) ×3
GOWN STRL NON-REIN LRG LVL3 (GOWN DISPOSABLE) IMPLANT
GOWN STRL REIN XL XLG (GOWN DISPOSABLE) ×6 IMPLANT
KIT BASIN OR (CUSTOM PROCEDURE TRAY) ×2 IMPLANT
MARKER SKIN DUAL TIP RULER LAB (MISCELLANEOUS) ×2 IMPLANT
NEEDLE INSUFFLATION 14GA 150MM (NEEDLE) IMPLANT
NEEDLE SPNL 22GX3.5 QUINCKE BK (NEEDLE) IMPLANT
NS IRRIG 1000ML POUR BTL (IV SOLUTION) ×2 IMPLANT
PENCIL BUTTON HOLSTER BLD 10FT (ELECTRODE) IMPLANT
RELOAD ENDO STITCH 2.0 (ENDOMECHANICALS) ×2
SCALPEL HARMONIC ACE (MISCELLANEOUS) IMPLANT
SCISSORS LAP 5X35 DISP (ENDOMECHANICALS) IMPLANT
SET IRRIG TUBING LAPAROSCOPIC (IRRIGATION / IRRIGATOR) IMPLANT
SLEEVE ADV FIXATION 5X100MM (TROCAR) IMPLANT
SOLUTION ANTI FOG 6CC (MISCELLANEOUS) ×2 IMPLANT
STAPLER VISISTAT 35W (STAPLE) IMPLANT
STRIP CLOSURE SKIN 1/2X4 (GAUZE/BANDAGES/DRESSINGS) IMPLANT
SUT MNCRL AB 4-0 PS2 18 (SUTURE) ×2 IMPLANT
SUT NOVA NAB GS-21 0 18 T12 DT (SUTURE) IMPLANT
SUT PROLENE 0 CT 1 CR/8 (SUTURE) IMPLANT
SUT RELOAD ENDO STITCH 2.0 (ENDOMECHANICALS) ×1
SUTURE RELOAD ENDO STITCH 2.0 (ENDOMECHANICALS) ×1 IMPLANT
TACKER 5MM HERNIA 3.5CML NAB (ENDOMECHANICALS) ×2 IMPLANT
TOWEL OR 17X26 10 PK STRL BLUE (TOWEL DISPOSABLE) ×4 IMPLANT
TRAY FOLEY CATH 14FRSI W/METER (CATHETERS) IMPLANT
TRAY LAP CHOLE (CUSTOM PROCEDURE TRAY) ×2 IMPLANT
TROCAR ADV FIXATION 11X100MM (TROCAR) IMPLANT
TROCAR ADV FIXATION 5X100MM (TROCAR) IMPLANT
TROCAR BLADELESS OPT 5 75 (ENDOMECHANICALS) ×6 IMPLANT
TROCAR XCEL BLUNT TIP 100MML (ENDOMECHANICALS) IMPLANT
TROCAR Z-THREAD FIOS 11X100 BL (TROCAR) IMPLANT
TROCAR Z-THREAD FIOS 5X100MM (TROCAR) IMPLANT
TROCAR Z-THREAD OPTICAL 11X100 (TROCAR) ×2 IMPLANT
TROCAR Z-THREAD SLEEVE 11X100 (TROCAR) IMPLANT
TUBING FILTER THERMOFLATOR (ELECTROSURGICAL) IMPLANT

## 2012-03-31 NOTE — Transfer of Care (Signed)
Immediate Anesthesia Transfer of Care Note  Patient: Kristen Dunn  Procedure(s) Performed: Procedure(s) (LRB) with comments: LAPAROSCOPIC INCISIONAL HERNIA (N/A) - LAPAROSCOPY REPAIR INTERNAL HERNIA  petersons defect  Patient Location: PACU  Anesthesia Type:General  Level of Consciousness: sedated, patient cooperative and responds to stimulation  Airway & Oxygen Therapy: Patient Spontanous Breathing and Patient connected to face mask oxygen  Post-op Assessment: Report given to PACU RN, Post -op Vital signs reviewed and stable and Patient moving all extremities X 4  Post vital signs: Reviewed and stable  Complications: No apparent anesthesia complications

## 2012-03-31 NOTE — Anesthesia Preprocedure Evaluation (Addendum)
Anesthesia Evaluation  Patient identified by MRN, date of birth, ID band Patient awake    Reviewed: Allergy & Precautions, H&P , NPO status , Patient's Chart, lab work & pertinent test results  History of Anesthesia Complications (+) PONV  Airway Mallampati: II TM Distance: >3 FB Neck ROM: full    Dental No notable dental hx. (+) Teeth Intact and Dental Advisory Given   Pulmonary asthma ,  breath sounds clear to auscultation  Pulmonary exam normal       Cardiovascular Exercise Tolerance: Good negative cardio ROS  Rhythm:regular Rate:Normal     Neuro/Psych negative neurological ROS  negative psych ROS   GI/Hepatic negative GI ROS, Neg liver ROS, Hx. Dilation of esophagus   Endo/Other  negative endocrine ROSGestational diabetes  Renal/GU negative Renal ROS  negative genitourinary   Musculoskeletal   Abdominal   Peds  Hematology negative hematology ROS (+) anemia , Hgb. 10.9   Anesthesia Other Findings   Reproductive/Obstetrics negative OB ROS                          Anesthesia Physical Anesthesia Plan  ASA: II  Anesthesia Plan: General   Post-op Pain Management:    Induction: Intravenous  Airway Management Planned: Oral ETT  Additional Equipment:   Intra-op Plan:   Post-operative Plan: Extubation in OR  Informed Consent: I have reviewed the patients History and Physical, chart, labs and discussed the procedure including the risks, benefits and alternatives for the proposed anesthesia with the patient or authorized representative who has indicated his/her understanding and acceptance.   Dental Advisory Given  Plan Discussed with: CRNA and Surgeon  Anesthesia Plan Comments:         Anesthesia Quick Evaluation

## 2012-03-31 NOTE — Anesthesia Postprocedure Evaluation (Signed)
  Anesthesia Post-op Note  Patient: Kristen Dunn  Procedure(s) Performed: Procedure(s) (LRB): LAPAROSCOPIC INCISIONAL HERNIA (N/A)  Patient Location: PACU  Anesthesia Type: General  Level of Consciousness: awake and alert   Airway and Oxygen Therapy: Patient Spontanous Breathing  Post-op Pain: mild  Post-op Assessment: Post-op Vital signs reviewed, Patient's Cardiovascular Status Stable, Respiratory Function Stable, Patent Airway and No signs of Nausea or vomiting  Last Vitals:  Filed Vitals:   03/31/12 1101  BP: 119/80  Pulse: 58  Temp: 36.6 C  Resp: 14    Post-op Vital Signs: stable   Complications: No apparent anesthesia complications

## 2012-03-31 NOTE — Op Note (Signed)
Preoperative Diagnosis: INTERNAL HERNIA  Postoprative Diagnosis: INTERNAL HERNIA  Procedure: Procedure(s): LAPAROSCOPIC REPAIR OF INTERNAL HERNIA, PETERSON DEFECT   Surgeon: Glenna Fellows T   Assistants: None  Anesthesia:  General endotracheal anesthesia  Indications:   Patient is a 36 year old female with a history of laparoscopic Roux-en-Y gastric bypass in 2008. She has had very successful weight loss and has done well until last week when she developed acute severe abdominal pain. She was admitted at an outside hospital and CT scan showed swirling of the mesentery and edema of the small bowel consistent with an internal hernia. She actually improved without surgery and was discharged but when I saw her in the office this week she was continuing to have some intermittent sharp abdominal pain related to eating. I recommended proceeding with urgent laparoscopy for probable internal hernia. The indications for the procedure, its nature, and risks have been discussed in detailed extensively elsewhere.  Procedure Detail:  Patient is brought to the operating room, placed in the supine position on the operating table, and general endotracheal anesthesia induced. She received preoperative IV antibiotics. PAS were in place. The abdomen was widely sterilely prepped and draped. Patient timeout was performed and the procedure verified. Access was obtained without difficulty with a 5 mm Optiview trocar in the left upper quadrant. There was no evidence of trocar injury. Under direct vision a 5 mm trocar was placed just to the left of the umbilicus and 25 mm trochars in the right upper quadrant and right midabdomen using her previous laparoscopic scars. There was noted to be some moderately dilated small bowel in the left upper quadrant. The jejunojejunostomy was visible in the left upper quadrant and appeared normal without mesenteric defect behind it. However as I tried to run the bowel from the  jejunojejunostomy it appeared posteriorly unusually fixed. I then identified the ileocecal valve which appeared normal and began to trace the bowel proximally. As I got up to the distal jejunum we could see that herniated bowel in the left upper quadrant was being brought underneath the mesentery back down to the right lower quadrant. The jejunojejunostomy was encountered and was brought beneath the mesenteric defect. I then traced the biliopancreatic limb posteriorly and it was in its normal position. I then went back to the jejunojejunostomy and trace the antecolic Roux limb up toward the gastric remnant and there was a clear large Peterson defect behind the mesentery of the Roux limb through which the bowel had been herniated. With the bowel now in its normal anatomic position the [place] defect was clearly defined. I changed one 5 mm trocar to a 12 mm trocar for suturing. Using a 2-0 silk Endo Stitch beginning at the base of the defect near the ligament of Treitz the mesentery of the Roux limb was sutured to the mesentery of the transverse colon working anteriorly up onto the transverse colon continuing the suture line between the mesentery of the Roux limb and the tinea of the transverse colon. This appeared to completely and securely close the defect. The abdomen was inspected for hemostasis or any evidence of bowel or trocar injury and everything looked fine. Trochars were then removed and all CO2 evacuated. Skin incisions were closed with subcuticular 4-0 Vicryl and Dermabond. Sponge needle and instrument counts were correct.  Findings: Vonita Moss defect internal hernia with small bowel herniation  Estimated Blood Loss:  less than 50 mL         Drains: none  Blood Given: none  Specimens: none        Complications:  * No complications entered in OR log *         Disposition: PACU - hemodynamically stable.         Condition: stable  Mariella Saa MD, FACS  03/31/2012, 10:11  AM

## 2012-03-31 NOTE — Anesthesia Procedure Notes (Signed)
Procedure Name: Intubation Performed by: Thornell Mule Pre-anesthesia Checklist: Patient identified, Emergency Drugs available, Suction available and Patient being monitored Patient Re-evaluated:Patient Re-evaluated prior to inductionOxygen Delivery Method: Circle system utilized Preoxygenation: Pre-oxygenation with 100% oxygen Intubation Type: IV induction Ventilation: Mask ventilation without difficulty Laryngoscope Size: Miller and 2 Grade View: Grade I Tube type: Oral Tube size: 7.0 mm Number of attempts: 1 Airway Equipment and Method: Stylet Placement Confirmation: ETT inserted through vocal cords under direct vision,  positive ETCO2 and breath sounds checked- equal and bilateral Secured at: 21 cm Tube secured with: Tape Dental Injury: Teeth and Oropharynx as per pre-operative assessment

## 2012-03-31 NOTE — H&P (View-Only) (Signed)
Subjective:   abdominal pain  Patient ID: Kristen Dunn, female   DOB: 1976-01-24, 36 y.o.   MRN: [id number]  HPI Patient returns to the office with a history of laparoscopic Roux-en-Y gastric bypass for morbid obesity in 2008. She had done extremely well following her surgery with excellent maintained weight loss. She was in her usual state of health until 5 days ago when she had the fairly sudden onset of severe abdominal pain which she describes as sharp across her entire abdomen and radiating to the back. This was constant and severe and associated with nausea but no vomiting. She presented to the [facility] emergency room. A CT scan was performed which I reviewed which shows swirling of the small bowel mesentery and venous congestion and edema of the small intestine. She was admitted and observed and over a couple of days the pain improved although did not go away entirely. She had another episode not quite as severe 3 days ago and was seen in the emergency room here, improved and was discharged with instructions to followup. She currently feels significantly better although has some occasional sharp midabdominal pain when trying to eat or drink. His had constipation since onset of the pain but had a bowel movement today. She states she clearly feels better than she did 3 days ago. She has not had any similar previous episodes. No chronic GI complaints. She is about 2 months postpartum from a C-section.  Past Medical History  Diagnosis Date  . Asthma   . Anemia   . AMA (advanced maternal age) multigravida 35+   . Gestational diabetes     glyburide  . PONV (postoperative nausea and vomiting)    Past Surgical History  Procedure Date  . Gastric bypass 2008   . Tonsillectomy   . Cesarean section 01/13/2012    Procedure: CESAREAN SECTION;  Surgeon: Catalina Antigua, MD;  Location: [facility];  Service: Obstetrics;  Laterality: N/A;   Current Outpatient Prescriptions  Medication Sig Dispense Refill  .  albuterol (PROVENTIL HFA;VENTOLIN HFA) 108 (90 BASE) MCG/ACT inhaler Inhale 1 puff into the lungs every 6 (six) hours as needed. Patient states she very seldom has to use, but she does keep one on hand      . docusate sodium (COLACE) 100 MG capsule Take 100 mg by mouth at bedtime.      Marland Kitchen loratadine (CLARITIN) 10 MG tablet Take 10 mg by mouth daily as needed. For allergies      . magnesium hydroxide (MILK OF MAGNESIA) 400 MG/5ML suspension Take 15 mLs by mouth daily as needed. For constipation      . norethindrone (MICRONOR,CAMILA,ERRIN) 0.35 MG tablet Take 1 tablet by mouth at bedtime.       Marland Kitchen oxyCODONE-acetaminophen (PERCOCET/ROXICET) 5-325 MG per tablet Take 1-2 tablets by mouth every 4 (four) hours as needed. For pain      . Prenatal Vit-Fe Fumarate-FA (MULTIVITAMIN-PRENATAL) 27-0.8 MG TABS Take 1 tablet by mouth daily.       Allergies  Allergen Reactions  . Penicillins Anaphylaxis    Reaction occurred as a very small child; anaphylaxis likely but not completely certain  . Aspirin     Intolerance because of gastric bypass surgery Asthma flares up  . Alupent (Metaproterenol) Palpitations and Other (See Comments)    hyperadrenergic tremor  . Lactose Intolerance (Gi) Diarrhea  . Latex Itching and Dermatitis   History  Substance Use Topics  . Smoking status: Never Smoker   . Smokeless tobacco: Never  Used  . Alcohol Use: No     Review of Systems  Constitutional: Negative for fever and chills.  Respiratory: Negative.   Cardiovascular: Negative.   Gastrointestinal: Positive for nausea, abdominal pain and constipation. Negative for vomiting and abdominal distention.  Genitourinary: Negative.        Objective:   Physical Exam BP 115/75  Pulse 78  Temp 98.8 F (37.1 C) (Temporal)  Resp 18  Ht 5\' 5"  (1.651 m)  Wt 179 lb 12.8 oz (81.557 kg)  BMI 29.92 kg/m2  LMP 02/23/2012 General: Well-appearing Caucasian female in no distress Skin: Warm and dry without rash or  infection HEENT: No palpable mass or thyromegaly. Sclera nonicteric Lymph nodes: No cervical, supraclavicular or inguinal nodes palpable Lungs: Clear equal breath sounds without increased work of breathing   Cardiovascular: Regular rate and rhythm. No edema Abdomen: Nondistended. Well-healed laparoscopic incisions. No hernias. There is mild tenderness across the upper and mid abdomen. No masses organomegaly appreciated. Extremities: No edema or joint swelling Neurologic: Alert and fully oriented. Gait normal.  Assessment:     Acute episode of severe abdominal pain last week with history of successful Roux-en-Y gastric bypass. CT scan clearly shows swirling of the mesentery and bowel edema consistent with an internal hernia. She fortunately has improved. She still has some mild tenderness. She will require laparoscopic exploration and probable repair of internal hernia. I discussed this with her including the indications and risks of anesthetic complications, bleeding, infection, bowel injury and possible need for open surgery. I scheduled this for the day after tomorrow if she clearly has improved and more recent CT scan shows resolution of the mesenteric twisting.    Plan:     Laparoscopic exploration and likely repair of internal hernia. The patient understands to call me immediately should she have any worsening symptoms prior to her surgery.

## 2012-03-31 NOTE — Progress Notes (Signed)
Pt is breastfeeding and informed me she brought her breast pump with her.

## 2012-03-31 NOTE — Preoperative (Signed)
Beta Blockers   Reason not to administer Beta Blockers:Not Applicable 

## 2012-03-31 NOTE — Interval H&P Note (Signed)
History and Physical Interval Note:  03/31/2012 8:30 AM  Kristen Dunn  has presented today for surgery, with the diagnosis of internal hernia  The various methods of treatment have been discussed with the patient and family. After consideration of risks, benefits and other options for treatment, the patient has consented to  Procedure(s) (LRB) with comments: LAPAROSCOPIC INCISIONAL HERNIA (N/A) - LAPAROSCOPY REPAIR INTERNAL HERNIA as a surgical intervention .  The patient's history has been reviewed, patient examined, no change in status, stable for surgery.  I have reviewed the patient's chart and labs.  Questions were answered to the patient's satisfaction.     Diamon Reddinger T

## 2012-04-01 ENCOUNTER — Encounter (HOSPITAL_COMMUNITY): Payer: Self-pay | Admitting: General Surgery

## 2012-04-01 ENCOUNTER — Telehealth (INDEPENDENT_AMBULATORY_CARE_PROVIDER_SITE_OTHER): Payer: Self-pay

## 2012-04-01 NOTE — Telephone Encounter (Signed)
Follow up call to patient- s/p hernia repair on 03/31/12.  Patient reports she's doing well, patient reports mild to moderate soreness but, overall feel's fairly good.  Patient scheduled for post op appointment on Friday 04/16/12 @ 11:30 w/Dr. Johna Sheriff.  Patient advised to call our office if she has any questions or concerns.

## 2012-04-01 NOTE — Telephone Encounter (Signed)
Message copied by Maryan Puls on Thu Apr 01, 2012 10:33 AM ------      Message from: Zacarias Pontes      Created: Thu Apr 01, 2012  9:23 AM       Pt needs po apt with BH,she had hernia sx on 12/11

## 2012-04-16 ENCOUNTER — Ambulatory Visit (INDEPENDENT_AMBULATORY_CARE_PROVIDER_SITE_OTHER): Payer: BC Managed Care – PPO | Admitting: General Surgery

## 2012-04-16 ENCOUNTER — Encounter (INDEPENDENT_AMBULATORY_CARE_PROVIDER_SITE_OTHER): Payer: Self-pay | Admitting: General Surgery

## 2012-04-16 VITALS — BP 130/64 | HR 76 | Temp 97.3°F | Resp 16 | Ht 65.0 in | Wt 176.6 lb

## 2012-04-16 DIAGNOSIS — Z09 Encounter for follow-up examination after completed treatment for conditions other than malignant neoplasm: Secondary | ICD-10-CM

## 2012-04-16 NOTE — Progress Notes (Signed)
History: Patient returns following laparoscopic repair of an internal hernia status post successful Roux-en-Y gastric bypass. She was found to have a Peterson defect the hernia with a large amount of small bowel herniated of the defect.  She reports she is doing very well since surgery with complete relief of her pain and tolerating her diet well.  Exam: BP 130/64  Pulse 76  Temp 97.3 F (36.3 C) (Temporal)  Resp 16  Ht 5\' 5"  (1.651 m)  Wt 176 lb 9.6 oz (80.105 kg)  BMI 29.39 kg/m2  LMP 02/23/2012 General: Appears well Abdomen: Soft and nontender wounds are well healed  Assessment and plan: Doing well following repair of internal hernia but no complication identified. She will call as needed I asked her to return for bariatric followup in one year.

## 2012-10-21 ENCOUNTER — Telehealth: Payer: Self-pay | Admitting: *Deleted

## 2012-10-21 NOTE — Telephone Encounter (Signed)
Complains of migraine with micronor needs to make appt to discuss options

## 2012-10-26 ENCOUNTER — Encounter: Payer: Self-pay | Admitting: Adult Health

## 2012-10-26 ENCOUNTER — Ambulatory Visit (INDEPENDENT_AMBULATORY_CARE_PROVIDER_SITE_OTHER): Payer: BC Managed Care – PPO | Admitting: Adult Health

## 2012-10-26 VITALS — BP 124/88 | Ht 65.0 in | Wt 183.0 lb

## 2012-10-26 DIAGNOSIS — Z8669 Personal history of other diseases of the nervous system and sense organs: Secondary | ICD-10-CM

## 2012-10-26 DIAGNOSIS — Z309 Encounter for contraceptive management, unspecified: Secondary | ICD-10-CM

## 2012-10-26 DIAGNOSIS — Z3049 Encounter for surveillance of other contraceptives: Secondary | ICD-10-CM

## 2012-10-26 DIAGNOSIS — Z3202 Encounter for pregnancy test, result negative: Secondary | ICD-10-CM

## 2012-10-26 HISTORY — DX: Personal history of other diseases of the nervous system and sense organs: Z86.69

## 2012-10-26 LAB — POCT URINE PREGNANCY: Preg Test, Ur: NEGATIVE

## 2012-10-26 MED ORDER — NORETHIN ACE-ETH ESTRAD-FE 1-20 MG-MCG(24) PO CHEW: 1.0000 | CHEWABLE_TABLET | Freq: Every day | ORAL | Status: DC

## 2012-10-26 MED ORDER — NORETHIN ACE-ETH ESTRAD-FE 1-20 MG-MCG(24) PO TABS: 1.0000 | ORAL_TABLET | Freq: Every day | ORAL | Status: DC

## 2012-10-26 NOTE — Patient Instructions (Addendum)
Start minastrin with next menses Use condoms Physical in february

## 2012-10-26 NOTE — Progress Notes (Signed)
Subjective:     Patient ID: Kristen Dunn, female   DOB: 1975/08/26, 37 y.o.   MRN: [id number]  HPI Kristen Dunn is a 37 year old white female married in to discuss change birth control pills, she is on Micronor and wants to go back to what she was taking before the baby, which was loesrtin 24 fe.  Review of Systems Has history of migraines and they were better on prior birth control pill when she took them continuously.No other complaints. Reviewed past medical,surgical, social and family history. Reviewed medications and allergies.     Objective:   Physical Exam BP 124/88  Ht 5\' 5"  (1.651 m)  Wt 183 lb (83.008 kg)  BMI 30.45 kg/m2  LMP 10/20/2012  Breastfeeding? NoUrine pregnancy test negative.Discussed birth control and future pregnancy, will Rx  Minastrin 24 fe Discussed having baby by 6, and risks after that.    Assessment:      Contraceptive management History of migraines, without aura    Plan:     Start Minastrin at end of this pack of micronor, and use condoms x 1 pack Rx minastrin # 3 packs and [facility]ills [facility], and 1 sample pack given, lot #[id number], exp 1/16 Follow up in February for pap and physical.

## 2012-10-27 ENCOUNTER — Encounter: Payer: Self-pay | Admitting: Adult Health

## 2012-10-27 ENCOUNTER — Telehealth: Payer: Self-pay | Admitting: *Deleted

## 2012-10-27 MED ORDER — NITROFURANTOIN MONOHYD MACRO 100 MG PO CAPS: 100.0000 mg | ORAL_CAPSULE | Freq: Two times a day (BID) | ORAL | Status: DC

## 2012-10-27 NOTE — Telephone Encounter (Signed)
Spoke with pt. She started having UTI symptoms last pm. Started AZO today. Having burning and urgency. Can you call in meds? Uses [facility] in [place] for urgent meds. Thanks!!!

## 2012-10-27 NOTE — Telephone Encounter (Signed)
Called macrobid in to [facility]

## 2012-11-22 ENCOUNTER — Emergency Department (HOSPITAL_COMMUNITY)
Admission: EM | Admit: 2012-11-22 | Discharge: 2012-11-22 | Disposition: A | Discharge status: Home / Self Care | Payer: BC Managed Care – PPO | Source: Home / Self Care | Attending: Emergency Medicine | Admitting: Emergency Medicine

## 2012-11-22 ENCOUNTER — Encounter (HOSPITAL_COMMUNITY): Payer: Self-pay

## 2012-11-22 DIAGNOSIS — G43909 Migraine, unspecified, not intractable, without status migrainosus: Secondary | ICD-10-CM

## 2012-11-22 MED ORDER — KETOROLAC TROMETHAMINE 60 MG/2ML IM SOLN
60.0000 mg | Freq: Once | INTRAMUSCULAR | Status: AC
  Administered 2012-11-22: 60 mg via INTRAMUSCULAR

## 2012-11-22 MED ORDER — SUMATRIPTAN SUCCINATE 50 MG PO TABS: 50.0000 mg | ORAL_TABLET | ORAL | Status: DC | PRN

## 2012-11-22 MED ORDER — SUMATRIPTAN SUCCINATE 6 MG/0.5ML ~~LOC~~ SOLN
6.0000 mg | Freq: Once | SUBCUTANEOUS | Status: AC
  Administered 2012-11-22: 6 mg via SUBCUTANEOUS

## 2012-11-22 MED ORDER — KETOROLAC TROMETHAMINE 60 MG/2ML IM SOLN
INTRAMUSCULAR | Status: AC
  Filled 2012-11-22: qty 2

## 2012-11-22 MED ORDER — DEXAMETHASONE SODIUM PHOSPHATE 10 MG/ML IJ SOLN
INTRAMUSCULAR | Status: AC
  Filled 2012-11-22: qty 1

## 2012-11-22 MED ORDER — SUMATRIPTAN SUCCINATE 6 MG/0.5ML ~~LOC~~ SOLN
SUBCUTANEOUS | Status: AC
  Filled 2012-11-22: qty 0.5

## 2012-11-22 MED ORDER — DEXAMETHASONE SODIUM PHOSPHATE 10 MG/ML IJ SOLN
10.0000 mg | Freq: Once | INTRAMUSCULAR | Status: AC
  Administered 2012-11-22: 10 mg via INTRAMUSCULAR

## 2012-11-22 NOTE — ED Provider Notes (Signed)
Chief Complaint:   Chief Complaint  Patient presents with  . Migraine    History of Present Illness:   Kristen Dunn is a 37 year old female who has had a typical migraine headaches for 3 weeks off-and-on. This has been associated with a change of birth control pills, although she thinks the headache started actually before she made the change in her birth control. The headaches began on the left but have now shifted to the right. They are severe and throbbing. They're associated with visual auras, photophobia, and phonophobia but not with nausea or vomiting. Her neck feels tight and stiff. She denies any fever, chills, diplopia, nasal congestion, rhinorrhea, or sore throat. She denies a numbness, tingling, or difficulty with speech or ambulation. She has had migraine headaches off-and-on for years and has been seen at the headaches Center. She has taken sumatriptan in the past as well as Topamax. She got off the Topamax with her last pregnancy. She does not want to go back on because she plans to become pregnant again.  Review of Systems:  Other than noted above, the patient denies any of the following symptoms: Systemic:  No fever, chills, fatigue, photophobia, stiff neck. Eye:  No redness, eye pain, discharge, blurred vision, or diplopia. ENT:  No nasal congestion, rhinorrhea, sinus pressure or pain, sneezing, earache, or sore throat.  No jaw claudication. Neuro:  No paresthesias, loss of consciousness, seizure activity, muscle weakness, trouble with coordination or gait, trouble speaking or swallowing. Psych:  No depression, anxiety or trouble sleeping.  PMFSH:  Past medical history, family history, social history, meds, and allergies were reviewed.  She is allergic to penicillin Alupent.  Physical Exam:   Vital signs:  BP 130/73  Pulse 78  Temp(Src) 98 F (36.7 C) (Oral)  Resp 16  SpO2 100%  LMP 11/15/2012 General:  Alert and oriented.  In no distress. Eye:  Lids and conjunctivas  normal.  PERRL,  Full EOMs.  Fundi benign with normal discs and vessels. ENT:  No cranial or facial tenderness to palpation.  TMs and canals clear.  Nasal mucosa was normal and uncongested without any drainage. No intra oral lesions, pharynx clear, mucous membranes moist, dentition normal. Neck:  Supple, full ROM, no tenderness to palpation.  No adenopathy or mass. Neuro:  Alert and orented times 3.  Speech was clear, fluent, and appropriate.  Cranial nerves intact. No pronator drift, muscle strength normal. Finger to nose normal.  DTRs were 2+ and symmetrical.Station and gait were normal.  Romberg's sign was normal.  Able to perform tandem gait well. Psych:  Normal affect.  Course in Urgent Care Center:   She was given sumatriptan 6 mg subcutaneous, Toradol 30 mg IM, and Decadron 10 mg IM.  Assessment:  The encounter diagnosis was Migraine headache.  No evidence of meningitis or intracranial process.  Plan:   1.  The following meds were prescribed:   Discharge Medication List as of 11/22/2012  3:30 PM    START taking these medications   Details  SUMAtriptan (IMITREX) 50 MG tablet Take 1 tablet (50 mg total) by mouth every 2 (two) hours as needed for migraine., Starting 11/22/2012, Until Discontinued, Normal       2.  The patient was instructed in symptomatic care and handouts were given. 3.  The patient was told to return if becoming worse in any way, if no better in 3 or 4 days, and given some red flag symptoms such as fever, worsening pain, or new  neurological symptoms that would indicate earlier return. 4.  Follow up here if needed.    Reuben Likes, MD 11/22/12 2227

## 2012-11-22 NOTE — ED Notes (Signed)
C/o 3 week duration of migraine HA, comes and goes, never completely gone; has a Rx for antidepressants, but they knock her out , and she cannot take care of her infant son

## 2013-02-24 ENCOUNTER — Other Ambulatory Visit: Payer: Self-pay

## 2013-03-14 ENCOUNTER — Encounter (HOSPITAL_COMMUNITY): Payer: Self-pay | Admitting: Emergency Medicine

## 2013-03-14 ENCOUNTER — Observation Stay (HOSPITAL_COMMUNITY)
Admission: EM | Admit: 2013-03-14 | Discharge: 2013-03-15 | Disposition: A | Discharge status: Home / Self Care | Payer: BC Managed Care – PPO | Attending: Surgery | Admitting: Surgery

## 2013-03-14 ENCOUNTER — Ambulatory Visit (HOSPITAL_COMMUNITY)
Admission: RE | Admit: 2013-03-14 | Discharge: 2013-03-14 | Disposition: A | Discharge status: Home / Self Care | Payer: BC Managed Care – PPO | Source: Ambulatory Visit | Attending: General Surgery | Admitting: General Surgery

## 2013-03-14 ENCOUNTER — Encounter (HOSPITAL_COMMUNITY)
Admission: EM | Disposition: A | Discharge status: Home / Self Care | Payer: Self-pay | Source: Home / Self Care | Attending: Emergency Medicine

## 2013-03-14 ENCOUNTER — Telehealth (INDEPENDENT_AMBULATORY_CARE_PROVIDER_SITE_OTHER): Payer: Self-pay

## 2013-03-14 ENCOUNTER — Encounter (HOSPITAL_COMMUNITY): Payer: BC Managed Care – PPO | Admitting: *Deleted

## 2013-03-14 ENCOUNTER — Emergency Department (HOSPITAL_COMMUNITY): Payer: BC Managed Care – PPO | Admitting: *Deleted

## 2013-03-14 ENCOUNTER — Encounter (HOSPITAL_COMMUNITY): Payer: Self-pay

## 2013-03-14 DIAGNOSIS — Z79899 Other long term (current) drug therapy: Secondary | ICD-10-CM | POA: Insufficient documentation

## 2013-03-14 DIAGNOSIS — G43909 Migraine, unspecified, not intractable, without status migrainosus: Secondary | ICD-10-CM | POA: Insufficient documentation

## 2013-03-14 DIAGNOSIS — R109 Unspecified abdominal pain: Secondary | ICD-10-CM

## 2013-03-14 DIAGNOSIS — J45909 Unspecified asthma, uncomplicated: Secondary | ICD-10-CM | POA: Insufficient documentation

## 2013-03-14 DIAGNOSIS — Z9884 Bariatric surgery status: Secondary | ICD-10-CM | POA: Insufficient documentation

## 2013-03-14 DIAGNOSIS — K5730 Diverticulosis of large intestine without perforation or abscess without bleeding: Secondary | ICD-10-CM | POA: Insufficient documentation

## 2013-03-14 DIAGNOSIS — K458 Other specified abdominal hernia without obstruction or gangrene: Principal | ICD-10-CM | POA: Insufficient documentation

## 2013-03-14 DIAGNOSIS — K469 Unspecified abdominal hernia without obstruction or gangrene: Secondary | ICD-10-CM

## 2013-03-14 DIAGNOSIS — N83209 Unspecified ovarian cyst, unspecified side: Secondary | ICD-10-CM | POA: Insufficient documentation

## 2013-03-14 HISTORY — PX: LAPAROSCOPIC GASTRIC BANDING WITH HIATAL HERNIA REPAIR: SHX6351

## 2013-03-14 LAB — CBC WITH DIFFERENTIAL/PLATELET
Basophils Absolute: 0 10*3/uL (ref 0.0–0.1)
HCT: 37 % (ref 36.0–46.0)
Hemoglobin: 11.7 g/dL — ABNORMAL LOW (ref 12.0–15.0)
Lymphocytes Relative: 42 % (ref 12–46)
Monocytes Absolute: 0.3 10*3/uL (ref 0.1–1.0)
Monocytes Relative: 6 % (ref 3–12)
Neutro Abs: 2.9 10*3/uL (ref 1.7–7.7)
Neutrophils Relative %: 50 % (ref 43–77)
WBC: 5.8 10*3/uL (ref 4.0–10.5)

## 2013-03-14 LAB — COMPREHENSIVE METABOLIC PANEL
BUN: 9 mg/dL (ref 6–23)
Calcium: 9.7 mg/dL (ref 8.4–10.5)
Creatinine, Ser: 0.65 mg/dL (ref 0.50–1.10)
GFR calc Af Amer: >90 mL/min (ref >90)
Glucose, Bld: 89 mg/dL (ref 70–99)
Total Protein: 7.5 g/dL (ref 6.0–8.3)

## 2013-03-14 SURGERY — GASTRIC BANDING, LAPAROSCOPIC, WITH HIATAL HERNIA REPAIR
Anesthesia: General | Site: Abdomen

## 2013-03-14 MED ORDER — GLYCOPYRROLATE 0.2 MG/ML IJ SOLN
INTRAMUSCULAR | Status: AC
  Filled 2013-03-14: qty 3

## 2013-03-14 MED ORDER — MIDAZOLAM HCL 5 MG/5ML IJ SOLN
INTRAMUSCULAR | Status: DC | PRN
  Administered 2013-03-14: 2 mg via INTRAVENOUS

## 2013-03-14 MED ORDER — KCL IN DEXTROSE-NACL 20-5-0.45 MEQ/L-%-% IV SOLN
INTRAVENOUS | Status: DC
  Administered 2013-03-14: 23:00:00 via INTRAVENOUS
  Filled 2013-03-14 (×2): qty 1000

## 2013-03-14 MED ORDER — CISATRACURIUM BESYLATE (PF) 10 MG/5ML IV SOLN
INTRAVENOUS | Status: DC | PRN
  Administered 2013-03-14: 6 mg via INTRAVENOUS
  Administered 2013-03-14 (×2): 2 mg via INTRAVENOUS

## 2013-03-14 MED ORDER — ALBUTEROL SULFATE HFA 108 (90 BASE) MCG/ACT IN AERS: 1.0000 | INHALATION_SPRAY | Freq: Four times a day (QID) | RESPIRATORY_TRACT | Status: DC | PRN

## 2013-03-14 MED ORDER — KETAMINE HCL 50 MG/ML IJ SOLN
INTRAMUSCULAR | Status: AC
  Filled 2013-03-14: qty 10

## 2013-03-14 MED ORDER — SUCCINYLCHOLINE CHLORIDE 20 MG/ML IJ SOLN
INTRAMUSCULAR | Status: AC
  Filled 2013-03-14: qty 1

## 2013-03-14 MED ORDER — MIDAZOLAM HCL 2 MG/2ML IJ SOLN
INTRAMUSCULAR | Status: AC
  Filled 2013-03-14: qty 2

## 2013-03-14 MED ORDER — FENTANYL CITRATE 0.05 MG/ML IJ SOLN
INTRAMUSCULAR | Status: AC
  Filled 2013-03-14: qty 5

## 2013-03-14 MED ORDER — HYDROCODONE-ACETAMINOPHEN 5-325 MG PO TABS
1.0000 | ORAL_TABLET | ORAL | Status: DC | PRN
  Administered 2013-03-15 (×3): 2 via ORAL
  Filled 2013-03-14 (×3): qty 2

## 2013-03-14 MED ORDER — MORPHINE SULFATE 2 MG/ML IJ SOLN
1.0000 mg | INTRAMUSCULAR | Status: DC | PRN
  Administered 2013-03-14 – 2013-03-15 (×3): 2 mg via INTRAVENOUS
  Filled 2013-03-14 (×3): qty 1

## 2013-03-14 MED ORDER — ONDANSETRON HCL 4 MG PO TABS: 4.0000 mg | ORAL_TABLET | Freq: Four times a day (QID) | ORAL | Status: DC | PRN

## 2013-03-14 MED ORDER — METOCLOPRAMIDE HCL 5 MG/ML IJ SOLN
INTRAMUSCULAR | Status: DC | PRN
  Administered 2013-03-14: 10 mg via INTRAVENOUS

## 2013-03-14 MED ORDER — NEOSTIGMINE METHYLSULFATE 1 MG/ML IJ SOLN
INTRAMUSCULAR | Status: DC | PRN
  Administered 2013-03-14: 5 mg via INTRAVENOUS

## 2013-03-14 MED ORDER — CEFOTETAN DISODIUM-DEXTROSE 1-3.58 GM-% IV SOLR
INTRAVENOUS | Status: AC
  Filled 2013-03-14: qty 50

## 2013-03-14 MED ORDER — SUMATRIPTAN SUCCINATE 50 MG PO TABS
50.0000 mg | ORAL_TABLET | ORAL | Status: DC | PRN
  Filled 2013-03-14: qty 1

## 2013-03-14 MED ORDER — ACETAMINOPHEN 325 MG PO TABS: 650.0000 mg | ORAL_TABLET | ORAL | Status: DC | PRN

## 2013-03-14 MED ORDER — ONDANSETRON HCL 4 MG/2ML IJ SOLN
INTRAMUSCULAR | Status: DC | PRN
  Administered 2013-03-14: 4 mg via INTRAVENOUS

## 2013-03-14 MED ORDER — PROPOFOL 10 MG/ML IV BOLUS
INTRAVENOUS | Status: DC | PRN
  Administered 2013-03-14: 150 mg via INTRAVENOUS

## 2013-03-14 MED ORDER — KETAMINE HCL 10 MG/ML IJ SOLN
INTRAMUSCULAR | Status: DC | PRN
  Administered 2013-03-14: 100 mg via INTRAVENOUS

## 2013-03-14 MED ORDER — PROPOFOL 10 MG/ML IV BOLUS
INTRAVENOUS | Status: AC
  Filled 2013-03-14: qty 20

## 2013-03-14 MED ORDER — FENTANYL CITRATE 0.05 MG/ML IJ SOLN
INTRAMUSCULAR | Status: DC | PRN
  Administered 2013-03-14 (×2): 25 ug via INTRAVENOUS
  Administered 2013-03-14 (×4): 50 ug via INTRAVENOUS

## 2013-03-14 MED ORDER — HYDROMORPHONE HCL PF 1 MG/ML IJ SOLN
1.0000 mg | Freq: Once | INTRAMUSCULAR | Status: AC
  Administered 2013-03-14: 1 mg via INTRAVENOUS
  Filled 2013-03-14: qty 1

## 2013-03-14 MED ORDER — PROMETHAZINE HCL 25 MG/ML IJ SOLN: 6.2500 mg | INTRAMUSCULAR | Status: DC | PRN

## 2013-03-14 MED ORDER — SCOPOLAMINE 1 MG/3DAYS TD PT72
MEDICATED_PATCH | TRANSDERMAL | Status: AC
  Filled 2013-03-14: qty 1

## 2013-03-14 MED ORDER — 0.9 % SODIUM CHLORIDE (POUR BTL) OPTIME
TOPICAL | Status: DC | PRN
  Administered 2013-03-14: 1000 mL

## 2013-03-14 MED ORDER — ONDANSETRON HCL 4 MG/2ML IJ SOLN: 4.0000 mg | Freq: Four times a day (QID) | INTRAMUSCULAR | Status: DC | PRN

## 2013-03-14 MED ORDER — SUCCINYLCHOLINE CHLORIDE 20 MG/ML IJ SOLN
INTRAMUSCULAR | Status: DC | PRN
  Administered 2013-03-14: 100 mg via INTRAVENOUS

## 2013-03-14 MED ORDER — DEXTROSE 5 % IV SOLN
1.0000 g | Freq: Two times a day (BID) | INTRAVENOUS | Status: DC
  Administered 2013-03-15: 1 g via INTRAVENOUS
  Filled 2013-03-14 (×2): qty 1

## 2013-03-14 MED ORDER — HYDROMORPHONE HCL PF 1 MG/ML IJ SOLN
0.2500 mg | INTRAMUSCULAR | Status: DC | PRN
  Administered 2013-03-14 (×2): 0.5 mg via INTRAVENOUS

## 2013-03-14 MED ORDER — DEXAMETHASONE SODIUM PHOSPHATE 4 MG/ML IJ SOLN
INTRAMUSCULAR | Status: DC | PRN
  Administered 2013-03-14: 10 mg via INTRAVENOUS

## 2013-03-14 MED ORDER — PHENYLEPHRINE HCL 10 MG/ML IJ SOLN
INTRAMUSCULAR | Status: DC | PRN
  Administered 2013-03-14 (×2): 40 ug via INTRAVENOUS

## 2013-03-14 MED ORDER — HYDROMORPHONE HCL PF 1 MG/ML IJ SOLN
INTRAMUSCULAR | Status: AC
  Filled 2013-03-14: qty 1

## 2013-03-14 MED ORDER — ONDANSETRON HCL 4 MG/2ML IJ SOLN
INTRAMUSCULAR | Status: AC
  Filled 2013-03-14: qty 2

## 2013-03-14 MED ORDER — IOHEXOL 300 MG/ML  SOLN
100.0000 mL | Freq: Once | INTRAMUSCULAR | Status: AC | PRN
  Administered 2013-03-14: 100 mL via INTRAVENOUS

## 2013-03-14 MED ORDER — BUPIVACAINE HCL (PF) 0.25 % IJ SOLN
INTRAMUSCULAR | Status: AC
  Filled 2013-03-14: qty 30

## 2013-03-14 MED ORDER — GLYCOPYRROLATE 0.2 MG/ML IJ SOLN
INTRAMUSCULAR | Status: DC | PRN
  Administered 2013-03-14: 0.6 mg via INTRAVENOUS

## 2013-03-14 MED ORDER — IOHEXOL 300 MG/ML  SOLN
25.0000 mL | Freq: Once | INTRAMUSCULAR | Status: AC | PRN
  Administered 2013-03-14: 25 mL via ORAL

## 2013-03-14 MED ORDER — BUPIVACAINE HCL 0.25 % IJ SOLN
INTRAMUSCULAR | Status: DC | PRN
  Administered 2013-03-14: 25 mL

## 2013-03-14 MED ORDER — CEFOTETAN DISODIUM 1 G IJ SOLR
1.0000 g | INTRAMUSCULAR | Status: DC | PRN
  Administered 2013-03-14: 1 g via INTRAVENOUS

## 2013-03-14 MED ORDER — DEXAMETHASONE SODIUM PHOSPHATE 10 MG/ML IJ SOLN
INTRAMUSCULAR | Status: AC
  Filled 2013-03-14: qty 1

## 2013-03-14 MED ORDER — HEPARIN SODIUM (PORCINE) 5000 UNIT/ML IJ SOLN
5000.0000 [IU] | Freq: Three times a day (TID) | INTRAMUSCULAR | Status: DC
  Administered 2013-03-15: 5000 [IU] via SUBCUTANEOUS
  Filled 2013-03-14 (×4): qty 1

## 2013-03-14 MED ORDER — LACTATED RINGERS IV SOLN: INTRAVENOUS | Status: DC

## 2013-03-14 MED ORDER — LACTATED RINGERS IV SOLN
INTRAVENOUS | Status: DC | PRN
  Administered 2013-03-14 (×2): via INTRAVENOUS

## 2013-03-14 MED ORDER — SCOPOLAMINE 1 MG/3DAYS TD PT72
MEDICATED_PATCH | TRANSDERMAL | Status: DC | PRN
  Administered 2013-03-14: 1 via TRANSDERMAL

## 2013-03-14 MED ORDER — NEOSTIGMINE METHYLSULFATE 1 MG/ML IJ SOLN
INTRAMUSCULAR | Status: AC
  Filled 2013-03-14: qty 10

## 2013-03-14 SURGICAL SUPPLY — 65 items
ADH SKN CLS APL DERMABOND .7 (GAUZE/BANDAGES/DRESSINGS) ×1
APL SKNCLS STERI-STRIP NONHPOA (GAUZE/BANDAGES/DRESSINGS)
BENZOIN TINCTURE PRP APPL 2/3 (GAUZE/BANDAGES/DRESSINGS) IMPLANT
BLADE HEX COATED 2.75 (ELECTRODE) IMPLANT
BLADE SURG 15 STRL LF DISP TIS (BLADE) IMPLANT
BLADE SURG 15 STRL SS (BLADE)
CANISTER SUCTION 2500CC (MISCELLANEOUS) ×2 IMPLANT
CHLORAPREP W/TINT 26ML (MISCELLANEOUS) ×2 IMPLANT
DECANTER SPIKE VIAL GLASS SM (MISCELLANEOUS) IMPLANT
DERMABOND ADVANCED (GAUZE/BANDAGES/DRESSINGS) ×1
DERMABOND ADVANCED .7 DNX12 (GAUZE/BANDAGES/DRESSINGS) ×1 IMPLANT
DEVICE SUT QUICK LOAD TK 5 (STAPLE) IMPLANT
DEVICE SUT TI-KNOT TK 5X26 (MISCELLANEOUS) IMPLANT
DEVICE SUTURE ENDOST 10MM (ENDOMECHANICALS) IMPLANT
DISSECTOR BLUNT TIP ENDO 5MM (MISCELLANEOUS) IMPLANT
DRAPE CAMERA CLOSED 9X96 (DRAPES) IMPLANT
DRAPE LAPAROSCOPIC ABDOMINAL (DRAPES) ×2 IMPLANT
DRAPE UTILITY XL STRL (DRAPES) ×2 IMPLANT
ELECT REM PT RETURN 9FT ADLT (ELECTROSURGICAL) ×2
ELECTRODE REM PT RTRN 9FT ADLT (ELECTROSURGICAL) ×1 IMPLANT
GLOVE BIOGEL PI IND STRL 6.5 (GLOVE) ×1 IMPLANT
GLOVE BIOGEL PI IND STRL 7.0 (GLOVE) ×1 IMPLANT
GLOVE BIOGEL PI INDICATOR 6.5 (GLOVE) ×1
GLOVE BIOGEL PI INDICATOR 7.0 (GLOVE) ×1
GLOVE SURG SS PI 6.5 STRL IVOR (GLOVE) ×2 IMPLANT
GLOVE SURG SS PI 7.5 STRL IVOR (GLOVE) ×2 IMPLANT
GLOVE SURG SS PI 8.5 STRL IVOR (GLOVE) ×1
GLOVE SURG SS PI 8.5 STRL STRW (GLOVE) ×1 IMPLANT
GOWN PREVENTION PLUS LG XLONG (DISPOSABLE) ×2 IMPLANT
GOWN STRL REIN 2XL XLG LVL4 (GOWN DISPOSABLE) ×2 IMPLANT
GOWN STRL REIN XL XLG (GOWN DISPOSABLE) ×2 IMPLANT
HOVERMATT SINGLE USE (MISCELLANEOUS) ×2 IMPLANT
KIT BASIN OR (CUSTOM PROCEDURE TRAY) ×2 IMPLANT
NEEDLE SPNL 22GX3.5 QUINCKE BK (NEEDLE) IMPLANT
NS IRRIG 1000ML POUR BTL (IV SOLUTION) ×2 IMPLANT
PACK UNIVERSAL I (CUSTOM PROCEDURE TRAY) IMPLANT
PENCIL BUTTON HOLSTER BLD 10FT (ELECTRODE) IMPLANT
SCALPEL HARMONIC ACE (MISCELLANEOUS) IMPLANT
SCISSORS LAP 5X35 DISP (ENDOMECHANICALS) ×2 IMPLANT
SET IRRIG TUBING LAPAROSCOPIC (IRRIGATION / IRRIGATOR) IMPLANT
SLEEVE XCEL OPT CAN 5 100 (ENDOMECHANICALS) ×6 IMPLANT
SOLUTION ANTI FOG 6CC (MISCELLANEOUS) ×2 IMPLANT
SPONGE GAUZE 4X4 12PLY (GAUZE/BANDAGES/DRESSINGS) IMPLANT
SPONGE LAP 18X18 X RAY DECT (DISPOSABLE) IMPLANT
STAPLER VISISTAT 35W (STAPLE) IMPLANT
STRIP CLOSURE SKIN 1/2X4 (GAUZE/BANDAGES/DRESSINGS) IMPLANT
SUT ETHIBOND 2 0 SH (SUTURE)
SUT ETHIBOND 2 0 SH 36X2 (SUTURE) IMPLANT
SUT MON AB 5-0 PS2 18 (SUTURE) ×2 IMPLANT
SUT PROLENE 2 0 CT2 30 (SUTURE) IMPLANT
SUT SILK 0 (SUTURE)
SUT SILK 0 30XBRD TIE 6 (SUTURE) IMPLANT
SUT SILK 2 0 SH (SUTURE) ×2 IMPLANT
SUT SURGIDAC NAB ES-9 0 48 120 (SUTURE) IMPLANT
SUT VIC AB 2-0 SH 27 (SUTURE)
SUT VIC AB 2-0 SH 27X BRD (SUTURE) IMPLANT
SYR 20CC LL (SYRINGE) IMPLANT
SYR 30ML LL (SYRINGE) IMPLANT
TOWEL OR 17X26 10 PK STRL BLUE (TOWEL DISPOSABLE) ×2 IMPLANT
TRAY LAP CHOLE (CUSTOM PROCEDURE TRAY) ×2 IMPLANT
TROCAR BLADELESS 15MM (ENDOMECHANICALS) IMPLANT
TROCAR BLADELESS OPT 5 100 (ENDOMECHANICALS) ×2 IMPLANT
TROCAR XCEL NON-BLD 5MMX100MML (ENDOMECHANICALS) ×2 IMPLANT
TUBE CALIBRATION LAPBAND (TUBING) ×2 IMPLANT
TUBING INSUFFLATION 10FT LAP (TUBING) ×2 IMPLANT

## 2013-03-14 NOTE — ED Provider Notes (Signed)
CSN: [id number]     Arrival date & time 03/14/13  1554 History   First MD Initiated Contact with Patient 03/14/13 1607     Chief Complaint  Patient presents with  . Hernia   (Consider location/radiation/quality/duration/timing/severity/associated sxs/prior Treatment) HPI Comments: Patient is a 37 year old female with past medical history of asthma, anemia, gastric bypass in 2008 an incisional hernia repair in 2013 who presents to the emergency department after having a CT scan at [facility] and was called by [facility] [place] [facility] to [facility] to [facility] to see Dr. Ezzard Standing. Patient has been having mid-abdominal pain over the past 2 days similar to her prior bowel obstruction. Pain currently severe, worse with eating. She has had a decreased appetite, had his mood he for breakfast this morning. No nausea or vomiting. CT scan showed suspected Peterson internal hernia without overt obstruction or strangulation.  The history is provided by the patient and medical records.    Past Medical History  Diagnosis Date  . Asthma   . Anemia   . AMA (advanced maternal age) multigravida 35+   . Gestational diabetes     glyburide  . PONV (postoperative nausea and vomiting)   . Hx of migraines 10/26/2012   Past Surgical History  Procedure Laterality Date  . Gastric bypass 2008    . Tonsillectomy    . Cesarean section  01/13/2012    Procedure: CESAREAN SECTION;  Surgeon: Catalina Antigua, MD;  Location: [facility] ORS;  Service: Obstetrics;  Laterality: N/A;  . Incisional hernia repair  03/31/2012    Procedure: LAPAROSCOPIC INCISIONAL HERNIA;  Surgeon: Mariella Saa, MD;  Location: [facility] ORS;  Service: General;  Laterality: N/A;  LAPAROSCOPY REPAIR INTERNAL HERNIA  petersons defect   Family History  Problem Relation Age of Onset  . COPD Mother   . Asthma Mother   . Hypertension Father   . Aneurysm Father   . Asthma Sister   . Thyroid disease Maternal Grandmother   . Stroke Maternal Grandmother   . Von  Willebrand disease Cousin    History  Substance Use Topics  . Smoking status: Never Smoker   . Smokeless tobacco: Never Used  . Alcohol Use: No   OB History   Grav Para Term Preterm Abortions TAB SAB Ect Mult Living   1 1 1  0 0 0 0 0 0 1     Review of Systems  Constitutional: Positive for appetite change.  Gastrointestinal: Positive for abdominal pain.  All other systems reviewed and are negative.    Allergies  Penicillins; Aspirin; Alupent; Lactose intolerance (gi); and Latex  Home Medications   Current Outpatient Rx  Name  Route  Sig  Dispense  Refill  . acetaminophen (TYLENOL) 325 MG tablet   Oral   Take 650 mg by mouth every 6 (six) hours as needed.         Marland Kitchen albuterol (PROVENTIL HFA;VENTOLIN HFA) 108 (90 BASE) MCG/ACT inhaler   Inhalation   Inhale 1 puff into the lungs every 6 (six) hours as needed. Patient states she very seldom has to use, but she does keep one on hand         . Norethin Ace-Eth Estrad-FE (MINASTRIN 24 FE) 1-20 MG-MCG(24) CHEW   Oral   Chew 1 tablet by mouth daily.   84 tablet   4   . SUMAtriptan (IMITREX) 50 MG tablet   Oral   Take 1 tablet (50 mg total) by mouth every 2 (two) hours as needed for  migraine.   10 tablet   0    BP 130/89  Pulse 83  Temp(Src) 98.4 F (36.9 C) (Oral)  Resp 16  SpO2 100%  LMP 03/11/2013 Physical Exam  Nursing note and vitals reviewed. Constitutional: She is oriented to person, place, and time. She appears well-developed and well-nourished. No distress.  HENT:  Head: Normocephalic and atraumatic.  Mouth/Throat: Oropharynx is clear and moist.  Eyes: Conjunctivae are normal.  Neck: Normal range of motion. Neck supple.  Cardiovascular: Normal rate, regular rhythm and normal heart sounds.   Pulmonary/Chest: Effort normal and breath sounds normal.  Abdominal: Soft. Bowel sounds are normal. There is tenderness. There is guarding.    No peritoneal signs.  Musculoskeletal: Normal range of motion. She  exhibits no edema.  Neurological: She is alert and oriented to person, place, and time.  Skin: Skin is warm and dry. She is not diaphoretic.  Psychiatric: She has a normal mood and affect. Her behavior is normal.    ED Course  Procedures (including critical care time) Labs Review Labs Reviewed  CBC  COMPREHENSIVE METABOLIC PANEL   Imaging Review Ct Abdomen Pelvis W Contrast  03/14/2013   CLINICAL DATA:  Abdominal pain.  EXAM: CT ABDOMEN AND PELVIS WITH CONTRAST  TECHNIQUE: Multidetector CT imaging of the abdomen and pelvis was performed using the standard protocol following bolus administration of intravenous contrast.  CONTRAST:  25mL OMNIPAQUE IOHEXOL 300 MG/ML SOLN, OMNIPAQUE IOHEXOL 300 MG/ML SOLN  COMPARISON:  03/26/2012  FINDINGS: Prior gastric bypass. Common bile duct 5 mm, borderline dilated but similar to prior without intrahepatic biliary dilatation. Gallbladder unremarkable.  The liver, spleen, pancreas, and adrenal glands appear unremarkable. Left kidney lower pole scarring posteriorly. Kidneys otherwise unremarkable.  Small umbilical hernia contains adipose tissue.  Orally administered contrast extends through to the transverse colon.  Appendix normal.  There is sigmoid colon diverticulosis without specific findings of active diverticulitis. Mildly redundant sigmoid colon.  Low-density lesions in both ovaries, 4.0 x 2.5 cm on the left (formerly 3.7 x 2.8 cm) and 2.4 x 1.8 cm on the right. Small amount of free pelvic fluid is likely mildly complex.  Suspected Petersen internal hernia without overt obstruction or strangulation.  IMPRESSION: 1. Small amount of complex fluid in the pelvis. Cystic lesions of both ovaries -consider pelvic sonography for further characterization, particularly if the patient's pain is pelvic in origin. 2. Suspected Petersen internal hernia, but without overt obstruction or strangulation at this time. 3. Sigmoid colon diverticulosis without active  diverticulitis.   Electronically Signed   By: Herbie Baltimore M.D.   On: 03/14/2013 14:59    EKG Interpretation   None       MDM   1. Internal hernia     Pt presenting to be evaluated by Dr. Ezzard Standing. VSS. Labs, pain meds ordered. Dr. Ezzard Standing notified. 5:39 PM Dr. Ezzard Standing evaluated patient and will admit her.  Trevor Mace, PA-C 03/14/13 1739

## 2013-03-14 NOTE — Op Note (Signed)
03/14/2013  9:19 PM  PATIENT:  Kristen Dunn, 37 y.o., female, MRN: [id number]  PREOP DIAGNOSIS:  abdominal pain/POSSIBLE INTERNAL HERNIA  POSTOP DIAGNOSIS:   Internal hernia at [facility] defect with trapped biliary limb of RYGB  PROCEDURE:   Procedure(s): DIAGNOSTIC LAPAROSCOPY  WITH  INTERNAL HERNIA  REDUCTION and CLOSURE of Peterson defect   [pictures in chart]  SURGEON:   Ovidio Kin, M.D.  ASSISTANT:   None  ANESTHESIA:   general  Anesthesiologist: Einar Pheasant, MD CRNA: Randon Goldsmith, CRNA  General  EBL:  minimal  ml  BLOOD ADMINISTERED: none  DRAINS: none   LOCAL MEDICATIONS USED:   25 cc of 1/4% marcaine  SPECIMEN:   None  COUNTS CORRECT:  YES  INDICATIONS FOR PROCEDURE:  [name] is a 37 y.o. (DOB: November 14, 1975) white  female whose primary care physician is HAWKINS,EDWARD L, MD and comes for laparoscopic exploration for possible internal hernia.   The indications and risks of the surgery were explained to the patient.  The risks include, but are not limited to, infection, bleeding, and nerve injury.  OPERATIVE NOTE:  The patient was taken to room #1 at [facility] and underwent a general anesthesia supervised by Dr. Rica Mast.  She was given 1 gm of Cefotetan pre operatively.  She has PAS stockings.  Her abdomen was prepped with Chloroprep.   A time out was held and the surgical checklist run.   I accessed the abdomen with a 5 mm Optiview trocar in the LUQ.  I placed 3 additional 5 mm trocars: one in the LLQ, one in the RLQ, and one in the right mid abdomen.  The abdomen was explored.  Both lobes of the liver looked okay.  There were no bowel adhesions.  She had evidence of lymphatic obstruction of some of her proximal small bowel.     I started with the terminal ileum and walked the small bowel proximally.  I identified the jejuno-jejunostomy.  The roux limb went to the stomach pouch.  The patient has a very redundant transverse colon and the Roux limb goes  around the proximal left colon in route to the gastric pouch.  The Roux limb was spun and the bilary limb of the small bowel had evidence of lymphatic obstruction.  The biliary limb of small bowel was run to the ligament of Tritz.  This pulled the biliary limb of the small bowel back into a normal location and out of a Peterson defect.  The Vonita Moss defect was about 3 cm in size.     I closed the [place] defect with two 2-0 silk sutures.  I then the ran the bowel two more times to make sure there was no hernia or twist in the bowel.   I then removed the 5 mm trocars under direct visualization.  I infiltrated the skin with 25 cc of 1/4% marcaine.  I closed the skin with 5-0 monocryl and painted the wounds with Dermabond.   The patient tolerated the procedure well and was transferred to the recovery room in good condition.  Ovidio Kin, MD, [facility] Pager: [phone] Office phone:  [phone]

## 2013-03-14 NOTE — Telephone Encounter (Signed)
Patient called into office to report that since Saturday she's had an increase in abdominal discomfort and unable to eat solid foods due to abd pain.  Patient last bowel movement was on Friday.  Patient having flatus.  Patient denies having any nausea or vomiting.  Patient states " I feel like I did when I had a small bowel obstruction before"  Reviewed with Dr. Biagio Quint (Dr. Johna Sheriff not available) CT abd/pelvis ordered.

## 2013-03-14 NOTE — ED Notes (Signed)
Per pt, she called [place] [facility] in reference to pain in her stomach and pt was sent to [facility] for a CT. Pt was called back and told that she had a hernia and that she needed to the ER at [facility], Dr. Ezzard Standing will be seeing patient. Pt says she has been unable to eat a full meal since this past Saturday. Pt has generalized abdominal pain. Pt denies n/v/d.

## 2013-03-14 NOTE — Anesthesia Preprocedure Evaluation (Addendum)
Anesthesia Evaluation  Patient identified by MRN, date of birth, ID band Patient awake    Reviewed: Allergy & Precautions, H&P , NPO status , Patient's Chart, lab work & pertinent test results  History of Anesthesia Complications (+) PONV  Airway Mallampati: II TM Distance: >3 FB Neck ROM: full    Dental no notable dental hx. (+) Teeth Intact and Dental Advisory Given   Pulmonary asthma ,  breath sounds clear to auscultation  Pulmonary exam normal       Cardiovascular Exercise Tolerance: Good negative cardio ROS  Rhythm:regular Rate:Normal     Neuro/Psych negative neurological ROS  negative psych ROS   GI/Hepatic negative GI ROS, Neg liver ROS, Gastric ByPass 2008   Endo/Other  negative endocrine ROSneg diabetesGestational diabetes  Renal/GU negative Renal ROS  negative genitourinary   Musculoskeletal   Abdominal   Peds  Hematology negative hematology ROS (+) anemia , Hgb. 10.9   Anesthesia Other Findings   Reproductive/Obstetrics negative OB ROS                           Anesthesia Physical Anesthesia Plan  ASA: II  Anesthesia Plan: General   Post-op Pain Management:    Induction: Intravenous  Airway Management Planned: Oral ETT  Additional Equipment:   Intra-op Plan:   Post-operative Plan: Extubation in OR  Informed Consent: I have reviewed the patients History and Physical, chart, labs and discussed the procedure including the risks, benefits and alternatives for the proposed anesthesia with the patient or authorized representative who has indicated his/her understanding and acceptance.   Dental advisory given  Plan Discussed with: CRNA  Anesthesia Plan Comments:         Anesthesia Quick Evaluation

## 2013-03-14 NOTE — ED Provider Notes (Signed)
Medical screening examination/treatment/procedure(s) were performed by non-physician practitioner and as supervising physician I was immediately available for consultation/collaboration.  EKG Interpretation    Date/Time:    Ventricular Rate:    PR Interval:    QRS Duration:   QT Interval:    QTC Calculation:   R Axis:     Text Interpretation:                Layla Maw Kimberlie Csaszar, DO 03/14/13 2345

## 2013-03-14 NOTE — Telephone Encounter (Signed)
Patient CT results reviewed, patient has been advised to go to [facility] ED for further work-up.  Notified Will, PA and Dr. Ezzard Standing of CT results.  Dr. Ezzard Standing is our on-call surgeon for [facility] this evening and will follow patient care.

## 2013-03-14 NOTE — Telephone Encounter (Signed)
Patient having a CT of abdomen today at [facility].  Patient notified and aware of CT, patient to go now to start drinking contrast and CT will be at 2:30.

## 2013-03-14 NOTE — Transfer of Care (Signed)
Immediate Anesthesia Transfer of Care Note  Patient: Kristen Dunn  Procedure(s) Performed: Procedure(s): DIAGNOSTIC LAPAROSCOPY  WITH  INTERNAL HERNIA  REDUCTION WITH CLOSURE (N/A)  Patient Location: PACU  Anesthesia Type:General  Level of Consciousness: Patient easily awoken, sedated, comfortable, cooperative, following commands, responds to stimulation.   Airway & Oxygen Therapy: Patient spontaneously breathing, ventilating well, oxygen via simple oxygen mask.  Post-op Assessment: Report given to PACU RN, vital signs reviewed and stable, moving all extremities.   Post vital signs: Reviewed and stable.  Complications: No apparent anesthesia complications

## 2013-03-14 NOTE — H&P (Signed)
Re:   Kristen Dunn DOB:   09/05/1975 MRN:   [id number]  [facility] Admission  ASSESSMENT AND PLAN: 1.  Abdominal pian thought to be secondary to potential recurrent internal hernia.  I discussed surgical laparoscopic exploration with the patient.  She went through this before last year, so she is aware of surgery.  The risks included, but are not limited to bleeding, infection, perforation, bowel resection, and open surgery.  If the exploration is negative, I would do an upper endoscopy.  2.  RYGB - 2008  Successful weight loss from about 320 to 170 pounds. 3.  Repair of Peterson's internal hernia - 03/31/2012 - [facility] 4.  History of asthma.  Better the last 2 years. 5.  History of migraine headaches.  Has headaches 2 to 3 times per month.  She was seen [facility] - but not seeing them now. 6.  Cystic lesions of both ovaries seen on CT scan.  Sees [facility]. Emelda Fear at [facility]. 7.  Sigmoid diverticulosis 8.  History of DJD of the back  Never had surgery  Chief Complaint  Patient presents with  . Hernia   REFERRING PHYSICIAN: Fredirick Maudlin, MD  HISTORY OF PRESENT ILLNESS: Kristen Dunn is a 37 y.o. (DOB: 1975-09-16)  white  female whose primary care physician is HAWKINS,EDWARD L, MD and comes to the [facility] with increasing abdominal pain.  She had a RYGB for morbid obesity in 2008 and has lost about 150 pounds.  In Dec 2013, she presented with an internal Petterson's hernia which was repaired by Dr. Johna Sheriff.  She has done well in the intervening year.    Then on Friday, 03/11/2013, she had a sandwich and developed abdominal pain.  Saturday, 03/12/2013, she had diarrhea and thought this would pass.  She went to her sister's house in [place], [place], but did not feel good the whole time.  Yesterday, she laid around with abdominal pain.  She has not vomited.  She called our office today, which started the ball rolling to get a CT scan. She did have her esophagus  "stretched" about one year before her gastric bypass, but she has had no trouble since then.  CT scan - 03/14/2013 - 1. Small amount of complex fluid in the pelvis. Cystic lesions of both ovaries -consider pelvic sonography for further characterization, particularly if the patient's pain is pelvic in origin. 2. Suspected Petersen internal hernia, but without overt obstruction or strangulation at this time. 3. Sigmoid colon diverticulosis without active diverticulitis. WBC - 5,800 - 03/14/2013   Past Medical History  Diagnosis Date  . Asthma   . Anemia   . AMA (advanced maternal age) multigravida 35+   . Gestational diabetes     glyburide  . PONV (postoperative nausea and vomiting)   . Hx of migraines 10/26/2012    Past Surgical History  Procedure Laterality Date  . Gastric bypass 2008    . Tonsillectomy    . Cesarean section  01/13/2012    Procedure: CESAREAN SECTION;  Surgeon: Catalina Antigua, MD;  Location: [facility] ORS;  Service: Obstetrics;  Laterality: N/A;  . Incisional hernia repair  03/31/2012    Procedure: LAPAROSCOPIC INCISIONAL HERNIA;  Surgeon: Mariella Saa, MD;  Location: [facility] OR[facility];  Service: General;  Laterality: N/A;  LAPAROSCOPY REPAIR INTERNAL HERNIA  petersons defect      No current facility-administered medications for this encounter.   Current Outpatient Prescriptions  Medication Sig Dispense Refill  . acetaminophen (  TYLENOL) 325 MG tablet Take 650 mg by mouth every 6 (six) hours as needed.      Marland Kitchen albuterol (PROVENTIL HFA;VENTOLIN HFA) 108 (90 BASE) MCG/ACT inhaler Inhale 1 puff into the lungs every 6 (six) hours as needed. Patient states she very seldom has to use, but she does keep one on hand      . Norethin Ace-Eth Estrad-FE (MINASTRIN 24 FE) 1-20 MG-MCG(24) CHEW Chew 1 tablet by mouth daily.  84 tablet  4  . SUMAtriptan (IMITREX) 50 MG tablet Take 1 tablet (50 mg total) by mouth every 2 (two) hours as needed for migraine.  10 tablet  0      Allergies  Allergen  Reactions  . Penicillins Anaphylaxis    Reaction occurred as a very small child; anaphylaxis likely but not completely certain  . Aspirin     Intolerance because of gastric bypass surgery Asthma flares up  . Alupent [Metaproterenol] Palpitations and Other (See Comments)    hyperadrenergic tremor  . Lactose Intolerance (Gi) Diarrhea  . Latex Itching and Dermatitis    REVIEW OF SYSTEMS: Skin:  No history of rash.  No history of abnormal moles. Infection:  No history of hepatitis or HIV.  No history of MRSA. Neurologic:  Migraine headaches, 2 to 3 per month.  She was seen at [facility], but no currently. Cardiac:  No history of hypertension. No history of heart disease.  No history of prior cardiac catheterization.  No history of seeing a cardiologist. Pulmonary:  Has asthma, which has been better the last 2 years.  Endocrine:  No diabetes. No thyroid disease. Gastrointestinal:  See HPI.  No history of liver disease.  No history of gall bladder disease.  No history of pancreas disease.  No history of colon disease. Urologic:  No history of kidney stones.  No history of bladder infections. GYN:  Having period now.  Has one son, 16 months old, by C section. Musculoskeletal:  Has chronic DJD of back.  Has chronic pain.  Has seen someone at [facility], but cannot remember his name. Hematologic:  No bleeding disorder.  No history of anemia.  Not anticoagulated. Psycho-social:  The patient is oriented.   The patient has no obvious psychologic or social impairment to understanding our conversation and plan.  SOCIAL and FAMILY HISTORY: Married. Works at [employer] as a Administrator, arts. Has one son, 13 months.  PHYSICAL EXAM: BP 130/89  Pulse 83  Temp(Src) 98.4 F (36.9 C) (Oral)  Resp 16  SpO2 100%  LMP 03/11/2013  General: WN WF who is alert.  She is having abdominal discomfort.  HEENT: Normal. Pupils equal. Neck: Supple. No mass.  No thyroid mass. Lymph Nodes:  No  supraclavicular or cervical nodes. Lungs: Clear to auscultation and symmetric breath sounds. Heart:  RRR. No murmur or rub. Abdomen: Soft. Tender epigastrium without peritoneal signs.  Has BS.  No mass or hernia. Rectal: Not done. Extremities:  Good strength and ROM  in upper and lower extremities.  A lot of weight still in legs/hips. Neurologic:  Grossly intact to motor and sensory function. Psychiatric: Has normal mood and affect. Behavior is normal.   DATA REVIEWED: CT scan, labs, and Epic notes.  Ovidio Kin, MD,  [facility] [address] [place]  [address]   [place]    [address] Phone:  [phone] FAX:  [phone]

## 2013-03-14 NOTE — Preoperative (Signed)
Beta Blockers   Reason not to administer Beta Blockers:Not Applicable, not on home BB 

## 2013-03-15 ENCOUNTER — Encounter (HOSPITAL_COMMUNITY): Payer: Self-pay | Admitting: Surgery

## 2013-03-15 DIAGNOSIS — K458 Other specified abdominal hernia without obstruction or gangrene: Secondary | ICD-10-CM | POA: Diagnosis present

## 2013-03-15 MED ORDER — HYDROCODONE-ACETAMINOPHEN 5-325 MG PO TABS: 1.0000 | ORAL_TABLET | ORAL | Status: DC | PRN

## 2013-03-15 NOTE — Care Management Note (Signed)
    Page 1 of 1   03/15/2013     11:07:29 AM   CARE MANAGEMENT NOTE 03/15/2013  Patient:  Kristen Dunn, Kristen Dunn   Account Number:  [id number]  Date Initiated:  03/15/2013  Documentation initiated by:  Lorenda Ishihara  Subjective/Objective Assessment:   37 yo female admitted s/p lap hernia repair. PTA lived at home with spouse.     Action/Plan:   Home when stable   Anticipated DC Date:  03/15/2013   Anticipated DC Plan:  HOME/SELF CARE      DC Planning Services  CM consult      Choice offered to / List presented to:             Status of service:  Completed, signed off Medicare Important Message given?   (If response is "NO", the following Medicare IM given date fields will be blank) Date Medicare IM given:   Date Additional Medicare IM given:    Discharge Disposition:  HOME/SELF CARE  Per UR Regulation:  Reviewed for med. necessity/level of care/duration of stay  If discussed at Long Length of Stay Meetings, dates discussed:    Comments:

## 2013-03-15 NOTE — Progress Notes (Signed)
Assessment unchanged. Pt verbalized understanding of dc instructions through teach back. Pt already has My Chart account and uses often. Script x 1 given as provided by MD. Discharged via wc to front entrance to meet awaiting vehicle to carry home. Accompanied by husband and NT.

## 2013-03-15 NOTE — Discharge Summary (Signed)
Physician Discharge Summary  Patient ID: KEMPER HOCHMAN MRN: [id number] DOB/AGE: Sep 14, 1975 37 y.o.  Admit date: 03/14/2013 Discharge date: 03/15/2013  Admitting Diagnosis: 1. Internal hernia at [facility] defect with trapped biliary limb of RYGB  2. RYGB - 2008 - Successful weight loss from about 320 to 170 pounds.  3. Repair of Peterson's internal hernia - 03/31/2012 - B. [facility]oxworth  4. History of asthma. - Better the last 2 years.  5. History of migraine headaches. - Has headaches 2 to 3 times per month. She was seen at [facility] - but not seeing them now.  6. Cystic lesions of both ovaries seen on CT scan. - Sees Dr. Emelda Fear at [facility].  7. Sigmoid diverticulosis  8. History of DJD of the back  Discharge Diagnosis Patient Active Problem List   Diagnosis Date Noted  . Internal hernia 03/15/2013  . Abdominal pain 03/14/2013  . Hx of migraines 10/26/2012    Consultants None  Imaging: Ct Abdomen Pelvis W Contrast  03/14/2013   CLINICAL DATA:  Abdominal pain.  EXAM: CT ABDOMEN AND PELVIS WITH CONTRAST  TECHNIQUE: Multidetector CT imaging of the abdomen and pelvis was performed using the standard protocol following bolus administration of intravenous contrast.  CONTRAST:  25mL OMNIPAQUE IOHEXOL 300 MG/ML SOLN, OMNIPAQUE IOHEXOL 300 MG/ML SOLN  COMPARISON:  03/26/2012  FINDINGS: Prior gastric bypass. Common bile duct 5 mm, borderline dilated but similar to prior without intrahepatic biliary dilatation. Gallbladder unremarkable.  The liver, spleen, pancreas, and adrenal glands appear unremarkable. Left kidney lower pole scarring posteriorly. Kidneys otherwise unremarkable.  Small umbilical hernia contains adipose tissue.  Orally administered contrast extends through to the transverse colon.  Appendix normal.  There is sigmoid colon diverticulosis without specific findings of active diverticulitis. Mildly redundant sigmoid colon.  Low-density lesions in both  ovaries, 4.0 x 2.5 cm on the left (formerly 3.7 x 2.8 cm) and 2.4 x 1.8 cm on the right. Small amount of free pelvic fluid is likely mildly complex.  Suspected Petersen internal hernia without overt obstruction or strangulation.  IMPRESSION: 1. Small amount of complex fluid in the pelvis. Cystic lesions of both ovaries -consider pelvic sonography for further characterization, particularly if the patient's pain is pelvic in origin. 2. Suspected Petersen internal hernia, but without overt obstruction or strangulation at this time. 3. Sigmoid colon diverticulosis without active diverticulitis.   Electronically Signed   By: Herbie Baltimore M.D.   On: 03/14/2013 14:59    Procedures Dr. Ezzard Standing (03/14/13) - Diagnostic laparoscopy with internal hernia reduction and closure of [facility] Course:  37 y.o. (DOB: 03/27/1976) white female whose primary care physician is HAWKINS,EDWARD L, MD and comes to the [facility] with increasing abdominal pain. She had a RYGB for morbid obesity in 2008 and has lost about 150 pounds. In Dec 2013, she presented with an internal Petterson's hernia which was repaired by Dr. Johna Sheriff. She has done well in the intervening year. Then on Friday, 03/11/2013, she had a sandwich and developed abdominal pain. Saturday, 03/12/2013, she had diarrhea and thought this would pass. She went to her sister's house in [place], [place], but did not feel good the whole time. Yesterday, she laid around with abdominal pain. She has not vomited. She called our office today, which started the ball rolling to get a CT scan. She did have her esophagus "stretched" about one year before her gastric bypass, but she has had no trouble since then.   CT scan  showed suspected Petersen internal hernia, but without overt obstruction or strangulation at this time and WBC - 5,800.  Patient was admitted and underwent procedure listed above.  Tolerated procedure well and was transferred to the floor.  Diet was advanced  as tolerated.  On POD #1, the patient was voiding well, tolerating diet, ambulating well, pain well controlled, vital signs stable, incisions c/d/i and felt stable for discharge home.  Patient will follow up in our office in 2-3 weeks and knows to call with questions or concerns.  Physical Exam: General:  Alert, NAD, pleasant, comfortable Abd:  Soft, ND, mild tenderness, incisions C/D/I     Medication List         acetaminophen 325 MG tablet  Commonly known as:  TYLENOL  Take 650 mg by mouth every 6 (six) hours as needed.     albuterol 108 (90 BASE) MCG/ACT inhaler  Commonly known as:  PROVENTIL HFA;VENTOLIN HFA  Inhale 1 puff into the lungs every 6 (six) hours as needed. Patient states she very seldom has to use, but she does keep one on hand     HYDROcodone-acetaminophen 5-325 MG per tablet  Commonly known as:  NORCO/VICODIN  Take 1-2 tablets by mouth every 4 (four) hours as needed for moderate pain.     Norethin Ace-Eth Estrad-FE 1-20 MG-MCG(24) Chew  Commonly known as:  MINASTRIN 24 FE  Chew 1 tablet by mouth daily.     SUMAtriptan 50 MG tablet  Commonly known as:  IMITREX  Take 1 tablet (50 mg total) by mouth every 2 (two) hours as needed for migraine.             Follow-up Information   Schedule an appointment as soon as possible for a visit with [facility] H, MD.   Specialty:  General Surgery   Contact information:   [address] [place] [address] [phone]       Signed: Candiss Norse [facility] [phone]  03/15/2013, 11:16 AM

## 2013-03-15 NOTE — Discharge Summary (Signed)
Care plan discussed with Dr. Ezzard Standing. Patient is doing well. I agree with the assessment and discharge treatment plan outlined by Ms. Dort, PA   Fowler. Derrell Lolling, M.D., [facility] General and Minimally invasive Surgery Breast and Colorectal Surgery Office:   [phone] Pager:   [phone]

## 2013-03-20 NOTE — Anesthesia Postprocedure Evaluation (Signed)
Anesthesia Post Note  Patient: Kristen Dunn  Procedure(s) Performed: Procedure(s) (LRB): DIAGNOSTIC LAPAROSCOPY  WITH  INTERNAL HERNIA  REDUCTION WITH CLOSURE (N/A)  Anesthesia type: General  Patient location: PACU  Post pain: Pain level controlled  Post assessment: Post-op Vital signs reviewed  Last Vitals:  Filed Vitals:   03/15/13 0956  BP: 100/64  Pulse: 89  Temp: 36.8 C  Resp: 20    Post vital signs: Reviewed  Level of consciousness: sedated  Complications: No apparent anesthesia complications

## 2013-03-25 ENCOUNTER — Ambulatory Visit (INDEPENDENT_AMBULATORY_CARE_PROVIDER_SITE_OTHER): Payer: BC Managed Care – PPO | Admitting: General Surgery

## 2013-03-28 ENCOUNTER — Encounter: Payer: Self-pay | Admitting: Adult Health

## 2013-03-28 ENCOUNTER — Telehealth (INDEPENDENT_AMBULATORY_CARE_PROVIDER_SITE_OTHER): Payer: Self-pay

## 2013-03-28 NOTE — Telephone Encounter (Signed)
Called and left message for patient to call our office.  Patient appointment time on 04/07/13 need's to be changed to 11:00 am due to surgery add on for Dr. Johna Sheriff.  Please make patient aware to arrive 15 minutes prior to arrival time.  Will send patient message in via Mychart

## 2013-03-29 ENCOUNTER — Other Ambulatory Visit: Payer: Self-pay | Admitting: Adult Health

## 2013-03-29 DIAGNOSIS — N83209 Unspecified ovarian cyst, unspecified side: Secondary | ICD-10-CM

## 2013-03-30 ENCOUNTER — Ambulatory Visit (INDEPENDENT_AMBULATORY_CARE_PROVIDER_SITE_OTHER): Payer: BC Managed Care – PPO

## 2013-03-30 ENCOUNTER — Ambulatory Visit (INDEPENDENT_AMBULATORY_CARE_PROVIDER_SITE_OTHER): Payer: BC Managed Care – PPO | Admitting: Adult Health

## 2013-03-30 ENCOUNTER — Encounter: Payer: Self-pay | Admitting: Adult Health

## 2013-03-30 VITALS — BP 130/80 | Ht 65.0 in | Wt 178.0 lb

## 2013-03-30 DIAGNOSIS — N83209 Unspecified ovarian cyst, unspecified side: Secondary | ICD-10-CM

## 2013-03-30 HISTORY — DX: Unspecified ovarian cyst, unspecified side: N83.209

## 2013-03-30 MED ORDER — SUMATRIPTAN SUCCINATE 50 MG PO TABS: 50.0000 mg | ORAL_TABLET | ORAL | Status: DC | PRN

## 2013-03-30 NOTE — Patient Instructions (Signed)
Follow up prn Ovarian Cyst The ovaries are small organs that are on each side of the uterus. The ovaries are the organs that produce the female hormones, estrogen and progesterone. An ovarian cyst is a sac filled with fluid that can vary in its size. It is normal for a small cyst to form in women who are in the childbearing age and who have menstrual periods. This type of cyst is called a follicle cyst that becomes an ovulation cyst (corpus luteum cyst) after it produces the women's egg. It later goes away on its own if the woman does not become pregnant. There are other kinds of ovarian cysts that may cause problems and may need to be treated. The most serious problem is a cyst with cancer. It should be noted that menopausal women who have an ovarian cyst are at a higher risk of it being a cancer cyst. They should be evaluated very quickly, thoroughly and followed closely. This is especially true in menopausal women because of the high rate of ovarian cancer in women in menopause. CAUSES AND TYPES OF OVARIAN CYSTS:  FUNCTIONAL CYST: The follicle/corpus luteum cyst is a functional cyst that occurs every month during ovulation with the menstrual cycle. They go away with the next menstrual cycle if the woman does not get pregnant. Usually, there are no symptoms with a functional cyst.  ENDOMETRIOMA CYST: This cyst develops from the lining of the uterus tissue. This cyst gets in or on the ovary. It grows every month from the bleeding during the menstrual period. It is also called a "chocolate cyst" because it becomes filled with blood that turns brown. This cyst can cause pain in the lower abdomen during intercourse and with your menstrual period.  CYSTADENOMA CYST: This cyst develops from the cells on the outside of the ovary. They usually are not cancerous. They can get very big and cause lower abdomen pain and pain with intercourse. This type of cyst can twist on itself, cut off its blood supply and cause  severe pain. It also can easily rupture and cause a lot of pain.  DERMOID CYST: This type of cyst is sometimes found in both ovaries. They are found to have different kinds of body tissue in the cyst. The tissue includes skin, teeth, hair, and/or cartilage. They usually do not have symptoms unless they get very big. Dermoid cysts are rarely cancerous.  POLYCYSTIC OVARY: This is a rare condition with hormone problems that produces many small cysts on both ovaries. The cysts are follicle-like cysts that never produce an egg and become a corpus luteum. It can cause an increase in body weight, infertility, acne, increase in body and facial hair and lack of menstrual periods or rare menstrual periods. Many women with this problem develop type 2 diabetes. The exact cause of this problem is unknown. A polycystic ovary is rarely cancerous.  THECA LUTEIN CYST: Occurs when too much hormone (human chorionic gonadotropin) is produced and over-stimulates the ovaries to produce an egg. They are frequently seen when doctors stimulate the ovaries for invitro-fertilization (test tube babies).  LUTEOMA CYST: This cyst is seen during pregnancy. Rarely it can cause an obstruction to the birth canal during labor and delivery. They usually go away after delivery. SYMPTOMS   Pelvic pain or pressure.  Pain during sexual intercourse.  Increasing girth (swelling) of the abdomen.  Abnormal menstrual periods.  Increasing pain with menstrual periods.  You stop having menstrual periods and you are not pregnant. DIAGNOSIS  The diagnosis can be made during:  Routine or annual pelvic examination (common).  Ultrasound.  X-ray of the pelvis.  CT Scan.  MRI.  Blood tests. TREATMENT   Treatment may only be to follow the cyst monthly for 2 to 3 months with your caregiver. Many go away on their own, especially functional cysts.  May be aspirated (drained) with a long needle with ultrasound, or by laparoscopy  (inserting a tube into the pelvis through a small incision).  The whole cyst can be removed by laparoscopy.  Sometimes the cyst may need to be removed through an incision in the lower abdomen.  Hormone treatment is sometimes used to help dissolve certain cysts.  Birth control pills are sometimes used to help dissolve certain cysts. HOME CARE INSTRUCTIONS  Follow your caregiver's advice regarding:  Medicine.  Follow up visits to evaluate and treat the cyst.  You may need to come back or make an appointment with another caregiver, to find the exact cause of your cyst, if your caregiver is not a gynecologist.  Get your yearly and recommended pelvic examinations and Pap tests.  Let your caregiver know if you have had an ovarian cyst in the past. SEEK MEDICAL CARE IF:   Your periods are late, irregular, they stop, or are painful.  Your stomach (abdomen) or pelvic pain does not go away.  Your stomach becomes larger or swollen.  You have pressure on your bladder or trouble emptying your bladder completely.  You have painful sexual intercourse.  You have feelings of fullness, pressure, or discomfort in your stomach.  You lose weight for no apparent reason.  You feel generally ill.  You become constipated.  You lose your appetite.  You develop acne.  You have an increase in body and facial hair.  You are gaining weight, without changing your exercise and eating habits.  You think you are pregnant. SEEK IMMEDIATE MEDICAL CARE IF:   You have increasing abdominal pain.  You feel sick to your stomach (nausea) and/or vomit.  You develop a fever that comes on suddenly.  You develop abdominal pain during a bowel movement.  Your menstrual periods become heavier than usual. Document Released: 04/07/2005 Document Revised: 06/30/2011 Document Reviewed: 02/08/2009 [facility] Patient Information 2014 [place], [place].

## 2013-03-30 NOTE — Progress Notes (Signed)
Subjective:     Patient ID: Kristen Dunn, female   DOB: May 17, 1975, 37 y.o.   MRN: [id number]  HPI Kristen Dunn is a 37 year old white female in for a [place].She recently had hernia repair and had CT that showed ?ovarian cyst  And free fluid in pelvis.She desires pregnancy in the near future.She is sp gastric bypass  Review of Systems See HPI Reviewed past medical,surgical, social and family history. Reviewed medications and allergies.     Objective:   Physical Exam BP 130/80  Ht 5\' 5"  (1.651 m)  Wt 178 lb (80.74 kg)  BMI 29.62 kg/m2  LMP 03/11/2013   has no complaints and [place] was normal except simple cyst left ovary no free fluid, has f/u with surgeon next week, ask him when ok to get pregnant.  Assessment:    Left ovarian cyst Desires pregnancy    Plan:     Follow up prn Review handout on ovarian cyst

## 2013-04-07 ENCOUNTER — Ambulatory Visit (INDEPENDENT_AMBULATORY_CARE_PROVIDER_SITE_OTHER): Payer: BC Managed Care – PPO | Admitting: General Surgery

## 2013-04-07 ENCOUNTER — Encounter (INDEPENDENT_AMBULATORY_CARE_PROVIDER_SITE_OTHER): Payer: Self-pay | Admitting: General Surgery

## 2013-04-07 VITALS — BP 136/72 | HR 92 | Temp 98.0°F | Resp 18 | Ht 65.0 in | Wt 180.8 lb

## 2013-04-07 DIAGNOSIS — Z09 Encounter for follow-up examination after completed treatment for conditions other than malignant neoplasm: Secondary | ICD-10-CM

## 2013-04-07 NOTE — Progress Notes (Signed)
History: Patient returns to the office 3 weeks following emergency laparoscopic repair of a recurrent Petersen defect hernia. Patient has a history of laparoscopic Roux-en-Y gastric bypass in 2008. She has maintained excellent weight loss. She had a large Petersen defect hernia repaired laparoscopically by me one year ago with a majority of her small bowel herniated. She did well until she developed acute abdominal pain 3 weeks ago and Dr. Ezzard Standing emergently operated on her and found a small, 3 cm, Petersen defect with a herniated loop of her biliopancreatic limb. Postoperatively she has done well. She still feels a little tired and she feels that she is filling up very quickly but not having any abdominal pain or vomiting or distention or other concerning symptoms.  Exam: BP 136/72  Pulse 92  Temp(Src) 98 F (36.7 C)  Resp 18  Ht 5\' 5"  (1.651 m)  Wt 180 lb 12.8 oz (82.01 kg)  BMI 30.09 kg/m2  LMP 03/11/2013 General: Appears well Abdomen: Soft and nontender. Incisions all healing well.  Assessment and plan: Doing well following repair of recurrent Petersen defect hernia. I will see her back in one month to make sure that her mild residual symptoms are resolving. No restrictions at this time and she will call for any concerns

## 2013-04-26 ENCOUNTER — Encounter (INDEPENDENT_AMBULATORY_CARE_PROVIDER_SITE_OTHER): Payer: Self-pay | Admitting: General Surgery

## 2013-05-03 ENCOUNTER — Other Ambulatory Visit: Payer: Self-pay | Admitting: Adult Health

## 2013-05-13 ENCOUNTER — Ambulatory Visit (INDEPENDENT_AMBULATORY_CARE_PROVIDER_SITE_OTHER): Payer: BC Managed Care – PPO | Admitting: General Surgery

## 2013-06-13 ENCOUNTER — Encounter: Payer: Self-pay | Admitting: Adult Health

## 2013-06-13 ENCOUNTER — Ambulatory Visit (INDEPENDENT_AMBULATORY_CARE_PROVIDER_SITE_OTHER): Payer: BC Managed Care – PPO | Admitting: Adult Health

## 2013-06-13 ENCOUNTER — Other Ambulatory Visit (HOSPITAL_COMMUNITY)
Admission: RE | Admit: 2013-06-13 | Discharge: 2013-06-13 | Disposition: A | Discharge status: Home / Self Care | Payer: BC Managed Care – PPO | Source: Ambulatory Visit | Attending: Adult Health | Admitting: Adult Health

## 2013-06-13 VITALS — BP 116/60 | HR 74 | Ht 65.0 in | Wt 183.0 lb

## 2013-06-13 DIAGNOSIS — Z1151 Encounter for screening for human papillomavirus (HPV): Secondary | ICD-10-CM | POA: Insufficient documentation

## 2013-06-13 DIAGNOSIS — Z01419 Encounter for gynecological examination (general) (routine) without abnormal findings: Secondary | ICD-10-CM

## 2013-06-13 NOTE — Patient Instructions (Signed)
Physical in  1 year Mammogram at 39 

## 2013-06-13 NOTE — Progress Notes (Signed)
Patient ID: Kristen Dunn, female   DOB: 1975-05-02, 38 y.o.   MRN: [id number] History of Present Illness: Kristen Dunn is a 38 year old white female, married in for a pap and physical.She has no complaints, except she has some body aches due to arthritis.She is going to stop her OCs soon and try to get pregnant.   Current Medications, Allergies, Past Medical History, Past Surgical History, Family History and Social History were reviewed in [employer] record.     Review of Systems: Patient denies any headaches, blurred vision, shortness of breath, chest pain, abdominal pain, problems with bowel movements, urination, or intercourse. No mood swings, see +in HPI.    Physical Exam:BP 116/60  Pulse 74  Ht 5\' 5"  (1.651 m)  Wt 183 lb (83.008 kg)  BMI 30.45 kg/m2  LMP 06/10/2013  Breastfeeding? No General:  Well developed, well nourished, no acute distress Skin:  Warm and dry Neck:  Midline trachea, normal thyroid Lungs; Clear to auscultation bilaterally Breast:  No dominant palpable mass, retraction, or nipple discharge Cardiovascular: Regular rate and rhythm Abdomen:  Soft, non tender, no hepatosplenomegaly Pelvic:  External genitalia is normal in appearance.  The vagina is normal in appearance, with period like blood. The cervix is bulbous.Pap with HPV performed.  Uterus is felt to be normal size, shape, and contour.  No adnexal masses or tenderness noted. Extremities:  No swelling noted Psych:  No mood changes, alert and cooperative, seems happy, got job promotion   Impression: Yearly gyn exam  Desires pregnancy in near future   Plan: Physical in 1 year Mammogram at 39  Take folic acid Call with + hcg

## 2013-10-11 ENCOUNTER — Ambulatory Visit (INDEPENDENT_AMBULATORY_CARE_PROVIDER_SITE_OTHER): Payer: BC Managed Care – PPO | Admitting: Adult Health

## 2013-10-11 ENCOUNTER — Encounter: Payer: Self-pay | Admitting: Adult Health

## 2013-10-11 VITALS — BP 132/84 | Ht 65.0 in | Wt 183.5 lb

## 2013-10-11 DIAGNOSIS — Z3201 Encounter for pregnancy test, result positive: Secondary | ICD-10-CM

## 2013-10-11 LAB — POCT URINE PREGNANCY: PREG TEST UR: POSITIVE

## 2013-10-11 NOTE — Progress Notes (Signed)
Pt here for pregnancy test. Positive result. Had some brown spotting last Sunday, none since then. No cramping. Advised spotting and cramping  can be normal during early pregnancy, that's just everything getting settled into place. Advised if she does start cramping, or if spotting returns, increase fluids and take it easy, and call office to let us know. Pt voiced understanding. Hardesty

## 2013-10-24 ENCOUNTER — Other Ambulatory Visit: Payer: Self-pay | Admitting: Obstetrics and Gynecology

## 2013-10-24 DIAGNOSIS — O09529 Supervision of elderly multigravida, unspecified trimester: Secondary | ICD-10-CM

## 2013-10-24 DIAGNOSIS — O3680X0 Pregnancy with inconclusive fetal viability, not applicable or unspecified: Secondary | ICD-10-CM

## 2013-10-24 DIAGNOSIS — O34219 Maternal care for unspecified type scar from previous cesarean delivery: Secondary | ICD-10-CM

## 2013-10-27 ENCOUNTER — Ambulatory Visit (INDEPENDENT_AMBULATORY_CARE_PROVIDER_SITE_OTHER): Payer: BC Managed Care – PPO

## 2013-10-27 ENCOUNTER — Other Ambulatory Visit: Payer: Self-pay | Admitting: Obstetrics and Gynecology

## 2013-10-27 DIAGNOSIS — O34219 Maternal care for unspecified type scar from previous cesarean delivery: Secondary | ICD-10-CM

## 2013-10-27 DIAGNOSIS — O09529 Supervision of elderly multigravida, unspecified trimester: Secondary | ICD-10-CM

## 2013-10-27 DIAGNOSIS — O26849 Uterine size-date discrepancy, unspecified trimester: Secondary | ICD-10-CM

## 2013-10-27 DIAGNOSIS — O3680X0 Pregnancy with inconclusive fetal viability, not applicable or unspecified: Secondary | ICD-10-CM

## 2013-10-27 NOTE — Progress Notes (Signed)
U/S-single IUP with +FCA noted, FHR-119 bpm, cx appears closed, bilateral adnexa appears WNL, CRL c/w 5+6wks EDD 06/23/2014

## 2013-11-07 ENCOUNTER — Encounter: Payer: Self-pay | Admitting: Women's Health

## 2013-11-07 ENCOUNTER — Ambulatory Visit (INDEPENDENT_AMBULATORY_CARE_PROVIDER_SITE_OTHER): Payer: BC Managed Care – PPO | Admitting: Women's Health

## 2013-11-07 VITALS — BP 124/76 | Wt 182.0 lb

## 2013-11-07 DIAGNOSIS — Z331 Pregnant state, incidental: Secondary | ICD-10-CM

## 2013-11-07 DIAGNOSIS — O09291 Supervision of pregnancy with other poor reproductive or obstetric history, first trimester: Secondary | ICD-10-CM

## 2013-11-07 DIAGNOSIS — Z3481 Encounter for supervision of other normal pregnancy, first trimester: Secondary | ICD-10-CM

## 2013-11-07 DIAGNOSIS — Z3480 Encounter for supervision of other normal pregnancy, unspecified trimester: Secondary | ICD-10-CM

## 2013-11-07 DIAGNOSIS — Z8632 Personal history of gestational diabetes: Secondary | ICD-10-CM

## 2013-11-07 DIAGNOSIS — O09521 Supervision of elderly multigravida, first trimester: Secondary | ICD-10-CM

## 2013-11-07 DIAGNOSIS — Z1389 Encounter for screening for other disorder: Secondary | ICD-10-CM

## 2013-11-07 DIAGNOSIS — O34219 Maternal care for unspecified type scar from previous cesarean delivery: Secondary | ICD-10-CM

## 2013-11-07 DIAGNOSIS — O09529 Supervision of elderly multigravida, unspecified trimester: Secondary | ICD-10-CM

## 2013-11-07 DIAGNOSIS — O09299 Supervision of pregnancy with other poor reproductive or obstetric history, unspecified trimester: Secondary | ICD-10-CM

## 2013-11-07 LAB — POCT URINALYSIS DIPSTICK
Blood, UA: NEGATIVE
Glucose, UA: NEGATIVE
Ketones, UA: NEGATIVE
Leukocytes, UA: NEGATIVE
NITRITE UA: NEGATIVE
Protein, UA: NEGATIVE

## 2013-11-07 LAB — CBC
HCT: 38.2 % (ref 36.0–46.0)
Hemoglobin: 12.8 g/dL (ref 12.0–15.0)
MCH: 31.1 pg (ref 26.0–34.0)
MCHC: 33.5 g/dL (ref 30.0–36.0)
MCV: 92.9 fL (ref 78.0–100.0)
PLATELETS: 316 10*3/uL (ref 150–400)
RBC: 4.11 MIL/uL (ref 3.87–5.11)
RDW: 15 % (ref 11.5–15.5)
WBC: 6.2 10*3/uL (ref 4.0–10.5)

## 2013-11-07 MED ORDER — DOXYLAMINE-PYRIDOXINE 10-10 MG PO TBEC: DELAYED_RELEASE_TABLET | ORAL | Status: DC

## 2013-11-07 NOTE — Progress Notes (Signed)
Subjective:  Kristen Dunn is a 38 y.o. G20P1001 Caucasian female at [redacted]w[redacted]d by 5.6wk u/s being seen today for her first obstetrical visit.  Her obstetrical history is significant for A2DM-glyburide last pregnancy although she is not convinced she had it- thinks gtt was inaccurate d/t h/o gastric bypass, prev c/s for FTP @ 5cm after IOL for A2DM, Roux en Y bypass in 2008 w/ 2 surgeries for what she reports as bowel obstructions, but she states they were actually labeled as hernias.  Pregnancy history fully reviewed.  Patient reports nausea- feels like it is getting some better- worse in afternoons. Denies vb, cramping, uti s/s, abnormal/malodorous vag d/c, or vulvovaginal itching/irritation.  BP 124/76  Wt 182 lb (82.555 kg)  LMP 09/01/2013  HISTORY: OB History  Gravida Para Term Preterm AB SAB TAB Ectopic Multiple Living  2 1 1  0 0 0 0 0 0 1    # Outcome Date GA Lbr Len/2nd Weight Sex Delivery Anes PTL Lv  2 CUR           1 TRM 01/13/12 [redacted]w[redacted]d  8 lb 5.9 oz (3.795 kg) M LTCS EPI  Y     Past Medical History  Diagnosis Date  . Asthma   . Anemia   . AMA (advanced maternal age) multigravida 21+   . Gestational diabetes     glyburide  . PONV (postoperative nausea and vomiting)   . Hx of migraines 10/26/2012  . Other and unspecified ovarian cyst 03/30/2013   Past Surgical History  Procedure Laterality Date  . Gastric bypass 2008    . Tonsillectomy    . Cesarean section  01/13/2012    Procedure: CESAREAN SECTION;  Surgeon: Mora Bellman, MD;  Location: [facility];  Service: Obstetrics;  Laterality: N/A;  . Incisional hernia repair  03/31/2012    Procedure: LAPAROSCOPIC INCISIONAL HERNIA;  Surgeon: Edward Jolly, MD;  Location: [facility];  Service: General;  Laterality: N/A;  LAPAROSCOPY REPAIR INTERNAL HERNIA  petersons defect  . Laparoscopic gastric banding with hiatal hernia repair N/A 03/14/2013    Procedure: DIAGNOSTIC LAPAROSCOPY  WITH  INTERNAL HERNIA  REDUCTION WITH CLOSURE;   Surgeon: Shann Medal, MD;  Location: [facility];  Service: General;  Laterality: N/A;   Family History  Problem Relation Age of Onset  . COPD Mother   . Asthma Mother   . Hypertension Father   . Aneurysm Father   . Asthma Sister   . Thyroid disease Maternal Grandmother   . Stroke Maternal Grandmother   . Von Willebrand disease Cousin     Exam   System:     General: Well developed & nourished, no acute distress   Skin: Warm & dry, normal coloration and turgor, no rashes   Neurologic: Alert & oriented, normal mood   Cardiovascular: Regular rate & rhythm   Respiratory: Effort & rate normal, LCTAB, acyanotic   Abdomen: Soft, non tender   Extremities: normal strength, tone   Pelvic Exam:    Perineum: Normal perineum   Vulva: Normal, no lesions   Vagina:  Normal mucosa, normal discharge   Cervix: Normal, bulbous, appears closed   Uterus: Normal size/shape/contour for GA   Thin prep pap smear Feb 2015: neg w/ -HRHPV  FHR: 138 via informal transabdominal u/s   Assessment:   Pregnancy: G2P1001 Patient Active Problem List   Diagnosis Date Noted  . Supervision of other normal pregnancy 11/07/2013    Priority: High  . History of gestational diabetes in prior  pregnancy, currently pregnant 11/07/2013    Priority: High  . AMA (advanced maternal age) multigravida 35+ 11/07/2013    Priority: High  . Previous cesarean section complicating pregnancy 68/02/5725    Priority: High  . Other and unspecified ovarian cyst 03/30/2013  . Internal hernia 03/15/2013  . Abdominal pain 03/14/2013  . Hx of migraines 10/26/2012    [redacted]w[redacted]d G2P1001 New OB visit Nausea of pregnancy H/o A2DM H/O C/s for FTP after IOL H/O Roux en Y gastric bypass w/ 2 ?bowel obstructions/hernias    Plan:  Initial labs drawn Continue prenatal vitamins Problem list reviewed and updated Reviewed n/v relief measures and warning s/s to report Rx diclegis, and 2 samples given- begin taking in am since nausea is  really only in afternoon.  Reviewed recommended weight gain based on pre-gravid BMI Encouraged well-balanced diet Genetic Screening discussed Integrated Screen: declined Cystic fibrosis screening discussed declined Ultrasound discussed; fetal survey: requested Follow up in asap for early 2hr gtt, then 4 weeks for visit [place] completed  Tawnya Crook CNM, [facility] 11/07/2013 10:30 AM

## 2013-11-07 NOTE — Patient Instructions (Signed)
Nausea & Vomiting  Have saltine crackers or pretzels by your bed and eat a few bites before you raise your head out of bed in the morning  Eat small frequent meals throughout the day instead of large meals  Drink plenty of fluids throughout the day to stay hydrated, just don't drink a lot of fluids with your meals.  This can make your stomach fill up faster making you feel sick  Do not brush your teeth right after you eat  Products with real ginger are good for nausea, like ginger ale and ginger hard candy Make sure it says made with real ginger!  Sucking on sour candy like lemon heads is also good for nausea  If your prenatal vitamins make you nauseated, take them at night so you will sleep through the nausea  If you feel like you need medicine for the nausea & vomiting please let us know  If you are unable to keep any fluids or food down please let us know    Pregnancy - First Trimester During sexual intercourse, millions of sperm go into the vagina. Only 1 sperm will penetrate and fertilize the female egg while it is in the Fallopian tube. One week later, the fertilized egg implants into the wall of the uterus. An embryo begins to develop into a baby. At 6 to 8 weeks, the eyes and face are formed and the heartbeat can be seen on ultrasound. At the end of 12 weeks (first trimester), all the baby's organs are formed. Now that you are pregnant, you will want to do everything you can to have a healthy baby. Two of the most important things are to get good prenatal care and follow your caregiver's instructions. Prenatal care is all the medical care you receive before the baby's birth. It is given to prevent, find, and treat problems during the pregnancy and childbirth. PRENATAL EXAMS  During prenatal visits, your weight, blood pressure, and urine are checked. This is done to make sure you are healthy and progressing normally during the pregnancy.  A pregnant woman should gain 25 to 35 pounds  during the pregnancy. However, if you are overweight or underweight, your caregiver will advise you regarding your weight.  Your caregiver will ask and answer questions for you.  Blood work, cervical cultures, other necessary tests, and a Pap test are done during your prenatal exams. These tests are done to check on your health and the probable health of your baby. Tests are strongly recommended and done for HIV with your permission. This is the virus that causes AIDS. These tests are done because medicines can be given to help prevent your baby from being born with this infection should you have been infected without knowing it. Blood work is also used to find out your blood type, previous infections, and follow your blood levels (hemoglobin).  Low hemoglobin (anemia) is common during pregnancy. Iron and vitamins are given to help prevent this. Later in the pregnancy, blood tests for diabetes will be done along with any other tests if any problems develop.  You may need other tests to make sure you and the baby are doing well. CHANGES DURING THE FIRST TRIMESTER  Your body goes through many changes during pregnancy. They vary from person to person. Talk to your caregiver about changes you notice and are concerned about. Changes can include:  Your menstrual period stops.  The egg and sperm carry the genes that determine what you look like. Genes from you  and your partner are forming a baby. The female genes determine whether the baby is a boy or a girl.  Your body increases in girth and you may feel bloated.  Feeling sick to your stomach (nauseous) and throwing up (vomiting). If the vomiting is uncontrollable, call your caregiver.  Your breasts will begin to enlarge and become tender.  Your nipples may stick out more and become darker.  The need to urinate more. Painful urination may mean you have a bladder infection.  Tiring easily.  Loss of appetite.  Cravings for certain kinds of  food.  At first, you may gain or lose a couple of pounds.  You may have changes in your emotions from day to day (excited to be pregnant or concerned something may go wrong with the pregnancy and baby).  You may have more vivid and strange dreams. HOME CARE INSTRUCTIONS   It is very important to avoid all smoking, alcohol and non-prescribed drugs during your pregnancy. These affect the formation and growth of the baby. Avoid chemicals while pregnant to ensure the delivery of a healthy infant.  Start your prenatal visits by the 12th week of pregnancy. They are usually scheduled monthly at first, then more often in the last 2 months before delivery. Keep your caregiver's appointments. Follow your caregiver's instructions regarding medicine use, blood and lab tests, exercise, and diet.  During pregnancy, you are providing food for you and your baby. Eat regular, well-balanced meals. Choose foods such as meat, fish, milk and other low fat dairy products, vegetables, fruits, and whole-grain breads and cereals. Your caregiver will tell you of the ideal weight gain.  You can help morning sickness by keeping soda crackers at the bedside. Eat a couple before arising in the morning. You may want to use the crackers without salt on them.  Eating 4 to 5 small meals rather than 3 large meals a day also may help the nausea and vomiting.  Drinking liquids between meals instead of during meals also seems to help nausea and vomiting.  A physical sexual relationship may be continued throughout pregnancy if there are no other problems. Problems may be early (premature) leaking of amniotic fluid from the membranes, vaginal bleeding, or belly (abdominal) pain.  Exercise regularly if there are no restrictions. Check with your caregiver or physical therapist if you are unsure of the safety of some of your exercises. Greater weight gain will occur in the last 2 trimesters of pregnancy. Exercising will  help:  Control your weight.  Keep you in shape.  Prepare you for labor and delivery.  Help you lose your pregnancy weight after you deliver your baby.  Wear a good support or jogging bra for breast tenderness during pregnancy. This may help if worn during sleep too.  Ask when prenatal classes are available. Begin classes when they are offered.  Do not use hot tubs, steam rooms, or saunas.  Wear your seat belt when driving. This protects you and your baby if you are in an accident.  Avoid raw meat, uncooked cheese, cat litter boxes, and soil used by cats throughout the pregnancy. These carry germs that can cause birth defects in the baby.  The first trimester is a good time to visit your dentist for your dental health. Getting your teeth cleaned is okay. Use a softer toothbrush and brush gently during pregnancy.  Ask for help if you have financial, counseling, or nutritional needs during pregnancy. Your caregiver will be able to offer counseling for  these needs as well as refer you for other special needs.  Do not take any medicines or herbs unless told by your caregiver.  Inform your caregiver if there is any mental or physical domestic violence.  Make a list of emergency phone numbers of family, friends, hospital, and police and fire departments.  Write down your questions. Take them to your prenatal visit.  Do not douche.  Do not cross your legs.  If you have to stand for long periods of time, rotate you feet or take small steps in a circle.  You may have more vaginal secretions that may require a sanitary pad. Do not use tampons or scented sanitary pads. MEDICINES AND DRUG USE IN PREGNANCY  Take prenatal vitamins as directed. The vitamin should contain 1 milligram of folic acid. Keep all vitamins out of reach of children. Only a couple vitamins or tablets containing iron may be fatal to a baby or young child when ingested.  Avoid use of all medicines, including herbs,  over-the-counter medicines, not prescribed or suggested by your caregiver. Only take over-the-counter or prescription medicines for pain, discomfort, or fever as directed by your caregiver. Do not use aspirin, ibuprofen, or naproxen unless directed by your caregiver.  Let your caregiver also know about herbs you may be using.  Alcohol is related to a number of birth defects. This includes fetal alcohol syndrome. All alcohol, in any form, should be avoided completely. Smoking will cause low birth rate and premature babies.  Street or illegal drugs are very harmful to the baby. They are absolutely forbidden. A baby born to an addicted mother will be addicted at birth. The baby will go through the same withdrawal an adult does.  Let your caregiver know about any medicines that you have to take and for what reason you take them. SEEK MEDICAL CARE IF:  You have any concerns or worries during your pregnancy. It is better to call with your questions if you feel they cannot wait, rather than worry about them. SEEK IMMEDIATE MEDICAL CARE IF:   An unexplained oral temperature above 102 F (38.9 C) develops, or as your caregiver suggests.  You have leaking of fluid from the vagina (birth canal). If leaking membranes are suspected, take your temperature and inform your caregiver of this when you call.  There is vaginal spotting or bleeding. Notify your caregiver of the amount and how many pads are used.  You develop a bad smelling vaginal discharge with a change in the color.  You continue to feel sick to your stomach (nauseated) and have no relief from remedies suggested. You vomit blood or coffee ground-like materials.  You lose more than 2 pounds of weight in 1 week.  You gain more than 2 pounds of weight in 1 week and you notice swelling of your face, hands, feet, or legs.  You gain 5 pounds or more in 1 week (even if you do not have swelling of your hands, face, legs, or feet).  You get  exposed to [place] measles and have never had them.  You are exposed to fifth disease or chickenpox.  You develop belly (abdominal) pain. Round ligament discomfort is a common non-cancerous (benign) cause of abdominal pain in pregnancy. Your caregiver still must evaluate this.  You develop headache, fever, diarrhea, pain with urination, or shortness of breath.  You fall or are in a car accident or have any kind of trauma.  There is mental or physical violence in your home. Document  Released: 04/01/2001 Document Revised: 12/31/2011 Document Reviewed: 02/15/2013 [facility] Patient Information 2015 [place], [place]. This information is not intended to replace advice given to you by your health care provider. Make sure you discuss any questions you have with your health care provider.

## 2013-11-08 ENCOUNTER — Encounter: Payer: Self-pay | Admitting: Women's Health

## 2013-11-08 DIAGNOSIS — Z283 Underimmunization status: Secondary | ICD-10-CM | POA: Insufficient documentation

## 2013-11-08 DIAGNOSIS — O9989 Other specified diseases and conditions complicating pregnancy, childbirth and the puerperium: Secondary | ICD-10-CM

## 2013-11-08 DIAGNOSIS — O09899 Supervision of other high risk pregnancies, unspecified trimester: Secondary | ICD-10-CM | POA: Insufficient documentation

## 2013-11-08 LAB — DRUG SCREEN, URINE, NO CONFIRMATION
AMPHETAMINE SCRN UR: NEGATIVE
Barbiturate Quant, Ur: NEGATIVE
Benzodiazepines.: NEGATIVE
COCAINE METABOLITES: NEGATIVE
CREATININE, U: 16.9 mg/dL
Marijuana Metabolite: NEGATIVE
Methadone: NEGATIVE
OPIATE SCREEN, URINE: NEGATIVE
Phencyclidine (PCP): NEGATIVE
Propoxyphene: NEGATIVE

## 2013-11-08 LAB — OXYCODONE SCREEN, UA, RFLX CONFIRM: Oxycodone Screen, Ur: NEGATIVE ng/mL

## 2013-11-08 LAB — URINALYSIS, MICROSCOPIC ONLY
CRYSTALS: NONE SEEN
Casts: NONE SEEN

## 2013-11-08 LAB — RUBELLA SCREEN: Rubella: 0.85 Index (ref <0.90)

## 2013-11-08 LAB — URINALYSIS, ROUTINE W REFLEX MICROSCOPIC
BILIRUBIN URINE: NEGATIVE
Glucose, UA: NEGATIVE mg/dL
Hgb urine dipstick: NEGATIVE
KETONES UR: NEGATIVE mg/dL
Nitrite: NEGATIVE
PH: 6.5 (ref 5.0–8.0)
Protein, ur: NEGATIVE mg/dL
Specific Gravity, Urine: 1.006 (ref 1.005–1.030)
Urobilinogen, UA: 0.2 mg/dL (ref 0.0–1.0)

## 2013-11-08 LAB — ABO AND RH: RH TYPE: POSITIVE

## 2013-11-08 LAB — HEPATITIS B SURFACE ANTIGEN: Hepatitis B Surface Ag: NEGATIVE

## 2013-11-08 LAB — ANTIBODY SCREEN: ANTIBODY SCREEN: NEGATIVE

## 2013-11-08 LAB — RPR

## 2013-11-08 LAB — VARICELLA ZOSTER ANTIBODY, IGG: Varicella IgG: 856.7 Index — ABNORMAL HIGH (ref <135.00)

## 2013-11-08 LAB — URINE CULTURE
Colony Count: NO GROWTH
ORGANISM ID, BACTERIA: NO GROWTH

## 2013-11-08 LAB — HIV ANTIBODY (ROUTINE TESTING W REFLEX): HIV 1&2 Ab, 4th Generation: NONREACTIVE

## 2013-11-14 ENCOUNTER — Other Ambulatory Visit: Payer: BC Managed Care – PPO

## 2013-11-14 DIAGNOSIS — O09291 Supervision of pregnancy with other poor reproductive or obstetric history, first trimester: Secondary | ICD-10-CM

## 2013-11-14 DIAGNOSIS — Z8632 Personal history of gestational diabetes: Principal | ICD-10-CM

## 2013-11-15 ENCOUNTER — Encounter: Payer: Self-pay | Admitting: Women's Health

## 2013-11-15 LAB — GLUCOSE TOLERANCE, 2 HOURS W/ 1HR
GLUCOSE, 2 HOUR: 42 mg/dL — AB (ref 70–139)
GLUCOSE: 142 mg/dL (ref 70–170)
Glucose, Fasting: 86 mg/dL (ref 70–99)

## 2013-12-05 ENCOUNTER — Ambulatory Visit (INDEPENDENT_AMBULATORY_CARE_PROVIDER_SITE_OTHER): Payer: BC Managed Care – PPO | Admitting: Obstetrics & Gynecology

## 2013-12-05 ENCOUNTER — Other Ambulatory Visit: Payer: Self-pay | Admitting: Obstetrics & Gynecology

## 2013-12-05 ENCOUNTER — Encounter: Payer: Self-pay | Admitting: Obstetrics & Gynecology

## 2013-12-05 ENCOUNTER — Ambulatory Visit (INDEPENDENT_AMBULATORY_CARE_PROVIDER_SITE_OTHER): Payer: BC Managed Care – PPO

## 2013-12-05 VITALS — BP 140/90 | Wt 185.0 lb

## 2013-12-05 DIAGNOSIS — O021 Missed abortion: Secondary | ICD-10-CM

## 2013-12-05 DIAGNOSIS — Z331 Pregnant state, incidental: Secondary | ICD-10-CM

## 2013-12-05 DIAGNOSIS — Z1389 Encounter for screening for other disorder: Secondary | ICD-10-CM

## 2013-12-05 DIAGNOSIS — O09521 Supervision of elderly multigravida, first trimester: Secondary | ICD-10-CM

## 2013-12-05 DIAGNOSIS — O09529 Supervision of elderly multigravida, unspecified trimester: Secondary | ICD-10-CM

## 2013-12-05 LAB — POCT URINALYSIS DIPSTICK
Blood, UA: NEGATIVE
GLUCOSE UA: NEGATIVE
Ketones, UA: NEGATIVE
LEUKOCYTES UA: NEGATIVE
NITRITE UA: NEGATIVE
Protein, UA: NEGATIVE

## 2013-12-05 NOTE — Progress Notes (Signed)
No doppler fca  Sonogram reveals fetal loss  Pt will manage conservatively   Follow up prn   cyutotec discussed

## 2013-12-05 NOTE — Progress Notes (Signed)
U/S(11+3wks)- single IUP with NO FCA NOTED, CRL c/w 7+6wks, no areas of hemorrhage noted, cx appears closed, RT ovary appears WNL, Lt ovary with 4.1 x 3.1cm cystic area noted, no free fluid noted within the pelvis

## 2013-12-10 ENCOUNTER — Emergency Department (HOSPITAL_COMMUNITY)
Admission: EM | Admit: 2013-12-10 | Discharge: 2013-12-10 | Disposition: A | Discharge status: Home / Self Care | Payer: BC Managed Care – PPO | Attending: Emergency Medicine | Admitting: Emergency Medicine

## 2013-12-10 ENCOUNTER — Encounter (HOSPITAL_COMMUNITY): Payer: Self-pay | Admitting: Emergency Medicine

## 2013-12-10 DIAGNOSIS — Z91040 Latex allergy status: Secondary | ICD-10-CM | POA: Diagnosis not present

## 2013-12-10 DIAGNOSIS — Z8632 Personal history of gestational diabetes: Secondary | ICD-10-CM | POA: Diagnosis not present

## 2013-12-10 DIAGNOSIS — Z791 Long term (current) use of non-steroidal anti-inflammatories (NSAID): Secondary | ICD-10-CM | POA: Insufficient documentation

## 2013-12-10 DIAGNOSIS — O039 Complete or unspecified spontaneous abortion without complication: Secondary | ICD-10-CM | POA: Diagnosis present

## 2013-12-10 DIAGNOSIS — O9989 Other specified diseases and conditions complicating pregnancy, childbirth and the puerperium: Secondary | ICD-10-CM | POA: Diagnosis not present

## 2013-12-10 DIAGNOSIS — Z9889 Other specified postprocedural states: Secondary | ICD-10-CM | POA: Diagnosis not present

## 2013-12-10 DIAGNOSIS — Z8679 Personal history of other diseases of the circulatory system: Secondary | ICD-10-CM | POA: Insufficient documentation

## 2013-12-10 DIAGNOSIS — D649 Anemia, unspecified: Secondary | ICD-10-CM | POA: Insufficient documentation

## 2013-12-10 DIAGNOSIS — Z880 Allergy status to penicillin: Secondary | ICD-10-CM | POA: Diagnosis not present

## 2013-12-10 DIAGNOSIS — Z8742 Personal history of other diseases of the female genital tract: Secondary | ICD-10-CM | POA: Insufficient documentation

## 2013-12-10 DIAGNOSIS — O99019 Anemia complicating pregnancy, unspecified trimester: Secondary | ICD-10-CM | POA: Diagnosis not present

## 2013-12-10 DIAGNOSIS — J45909 Unspecified asthma, uncomplicated: Secondary | ICD-10-CM | POA: Insufficient documentation

## 2013-12-10 DIAGNOSIS — Z79899 Other long term (current) drug therapy: Secondary | ICD-10-CM | POA: Diagnosis not present

## 2013-12-10 DIAGNOSIS — I498 Other specified cardiac arrhythmias: Secondary | ICD-10-CM | POA: Insufficient documentation

## 2013-12-10 LAB — BASIC METABOLIC PANEL
ANION GAP: 11 (ref 5–15)
BUN: 10 mg/dL (ref 6–23)
CALCIUM: 8.9 mg/dL (ref 8.4–10.5)
CO2: 24 mEq/L (ref 19–32)
Chloride: 105 mEq/L (ref 96–112)
Creatinine, Ser: 0.68 mg/dL (ref 0.50–1.10)
GFR calc non Af Amer: >90 mL/min (ref >90)
Glucose, Bld: 136 mg/dL — ABNORMAL HIGH (ref 70–99)
Potassium: 4.1 mEq/L (ref 3.7–5.3)
Sodium: 140 mEq/L (ref 137–147)

## 2013-12-10 LAB — CBC WITH DIFFERENTIAL/PLATELET
BASOS ABS: 0 10*3/uL (ref 0.0–0.1)
BASOS PCT: 0 % (ref 0–1)
Eosinophils Absolute: 0.1 10*3/uL (ref 0.0–0.7)
Eosinophils Relative: 1 % (ref 0–5)
HEMATOCRIT: 36.3 % (ref 36.0–46.0)
Hemoglobin: 12.4 g/dL (ref 12.0–15.0)
Lymphocytes Relative: 17 % (ref 12–46)
Lymphs Abs: 2 10*3/uL (ref 0.7–4.0)
MCH: 32 pg (ref 26.0–34.0)
MCHC: 34.2 g/dL (ref 30.0–36.0)
MCV: 93.8 fL (ref 78.0–100.0)
MONO ABS: 0.6 10*3/uL (ref 0.1–1.0)
Monocytes Relative: 5 % (ref 3–12)
Neutro Abs: 8.9 10*3/uL — ABNORMAL HIGH (ref 1.7–7.7)
Neutrophils Relative %: 77 % (ref 43–77)
Platelets: 255 10*3/uL (ref 150–400)
RBC: 3.87 MIL/uL (ref 3.87–5.11)
RDW: 13.2 % (ref 11.5–15.5)
WBC: 11.7 10*3/uL — ABNORMAL HIGH (ref 4.0–10.5)

## 2013-12-10 LAB — HCG, QUANTITATIVE, PREGNANCY: hCG, Beta Chain, Quant, S: 2215 m[IU]/mL — ABNORMAL HIGH (ref <5)

## 2013-12-10 LAB — ABO/RH: ABO/RH(D): A POS

## 2013-12-10 MED ORDER — ONDANSETRON HCL 4 MG/2ML IJ SOLN
4.0000 mg | Freq: Once | INTRAMUSCULAR | Status: AC
  Administered 2013-12-10: 4 mg via INTRAVENOUS
  Filled 2013-12-10: qty 2

## 2013-12-10 MED ORDER — SODIUM CHLORIDE 0.9 % IV BOLUS (SEPSIS)
1000.0000 mL | Freq: Once | INTRAVENOUS | Status: AC
  Administered 2013-12-10: 1000 mL via INTRAVENOUS

## 2013-12-10 MED ORDER — HYDROMORPHONE HCL PF 1 MG/ML IJ SOLN
1.0000 mg | Freq: Once | INTRAMUSCULAR | Status: AC
  Administered 2013-12-10: 1 mg via INTRAVENOUS
  Filled 2013-12-10: qty 1

## 2013-12-10 MED ORDER — METHYLERGONOVINE MALEATE 0.2 MG PO TABS: 0.2000 mg | ORAL_TABLET | Freq: Four times a day (QID) | ORAL | Status: DC

## 2013-12-10 NOTE — ED Provider Notes (Signed)
CSN: [id number]     Arrival date & time 12/10/13  1431 History   First MD Initiated Contact with Patient 12/10/13 1457     Chief Complaint  Patient presents with  . Miscarriage     (Consider location/radiation/quality/duration/timing/severity/associated sxs/prior Treatment) Patient is a 38 y.o. female presenting with vaginal bleeding. The history is provided by the patient.  Vaginal Bleeding Quality:  Bright red and clots Severity:  Severe Onset quality:  Sudden Timing:  Constant Progression:  Worsening Chronicity:  New Possible pregnancy: yes   Context: spontaneously   Relieved by:  Nothing Worsened by:  Nothing tried Ineffective treatments:  None tried Associated symptoms: abdominal pain    Kristen Dunn is a 38 y.o. G2P1001 @ [redacted]w[redacted]d by LMP who presents to the ED with vaginal bleeding. She was evaluated in the [facility] office early this week and had an ultrasound that showed an IUFD @ 7 week and 6 days gestation. She reports that today she started having heavy bleeding and severe cramping and decided to come to the ED.  Past Medical History  Diagnosis Date  . Asthma   . Anemia   . AMA (advanced maternal age) multigravida 20+   . Gestational diabetes     glyburide  . PONV (postoperative nausea and vomiting)   . Hx of migraines 10/26/2012  . Other and unspecified ovarian cyst 03/30/2013   Past Surgical History  Procedure Laterality Date  . Gastric bypass 2008    . Tonsillectomy    . Cesarean section  01/13/2012    Procedure: CESAREAN SECTION;  Surgeon: Mora Bellman, MD;  Location: [facility];  Service: Obstetrics;  Laterality: N/A;  . Incisional hernia repair  03/31/2012    Procedure: LAPAROSCOPIC INCISIONAL HERNIA;  Surgeon: Edward Jolly, MD;  Location: [facility];  Service: General;  Laterality: N/A;  LAPAROSCOPY REPAIR INTERNAL HERNIA  petersons defect  . Laparoscopic gastric banding with hiatal hernia repair N/A 03/14/2013    Procedure: DIAGNOSTIC LAPAROSCOPY  WITH  INTERNAL  HERNIA  REDUCTION WITH CLOSURE;  Surgeon: Shann Medal, MD;  Location: [facility];  Service: General;  Laterality: N/A;   Family History  Problem Relation Age of Onset  . COPD Mother   . Asthma Mother   . Hypertension Father   . Aneurysm Father   . Asthma Sister   . Thyroid disease Maternal Grandmother   . Stroke Maternal Grandmother   . Von Willebrand disease Cousin    History  Substance Use Topics  . Smoking status: Never Smoker   . Smokeless tobacco: Never Used  . Alcohol Use: No     Comment: rare; not now   OB History   Grav Para Term Preterm Abortions TAB SAB Ect Mult Living   2 1 1  0 0 0 0 0 0 1     Review of Systems  Gastrointestinal: Positive for abdominal pain.  Genitourinary: Positive for vaginal bleeding.  all other systems negataive    Allergies  Penicillins; Aspirin; Alupent; Lactose intolerance (gi); and Latex  Home Medications   Prior to Admission medications   Medication Sig Start Date End Date Taking? Authorizing Provider  acetaminophen (TYLENOL) 325 MG tablet Take 650 mg by mouth every 6 (six) hours as needed.   Yes Historical Provider, MD  albuterol (PROVENTIL HFA;VENTOLIN HFA) 108 (90 BASE) MCG/ACT inhaler Inhale 1 puff into the lungs every 6 (six) hours as needed. Patient states she very seldom has to use, but she does keep one on hand  Yes Historical Provider, MD  Doxylamine-Pyridoxine (DICLEGIS) 10-10 MG TBEC 2 tabs q hs, if sx persist add 1 tab q am on day 3, if sx persist add 1 tab q afternoon on day 4 11/07/13  Yes Hale Drone Booker, CNM  HYDROCODONE-APAP-DIETARY PROD PO Take 1 tablet by mouth once.   Yes Historical Provider, MD  Lactobacillus (ACIDOPHILUS PO) Take 1 tablet by mouth daily.    Yes Historical Provider, MD  naproxen sodium (ALEVE) 220 MG tablet Take 220 mg by mouth daily as needed (for pain).   Yes Historical Provider, MD  Prenatal Vit-Fe Fumarate-FA (PRENATAL VITAMIN PO) Take by mouth daily.   Yes Historical Provider, MD   methylergonovine (METHERGINE) 0.2 MG tablet Take 1 tablet (0.2 mg total) by mouth every 6 (six) hours. 12/10/13   Hope Bunnie Pion, NP  SUMAtriptan (IMITREX) 50 MG tablet Take 1 tablet (50 mg total) by mouth every 2 (two) hours as needed for migraine. 03/30/13   Estill Dooms, NP   BP 95/62  Pulse 58  Temp(Src) 97.8 F (36.6 C) (Oral)  Resp 11  Ht 5\' 5"  (1.651 m)  Wt 180 lb (81.647 kg)  BMI 29.95 kg/m2  SpO2 100%  LMP 09/01/2013 Physical Exam  Nursing note and vitals reviewed. Constitutional: She is oriented to person, place, and time. She appears well-developed and well-nourished.  HENT:  Head: Normocephalic.  Eyes: EOM are normal.  Neck: Neck supple.  Cardiovascular: Regular rhythm.  Bradycardia present.   Pulmonary/Chest: Effort normal and breath sounds normal.  Abdominal: Soft. There is tenderness.  There is mild tenderness with palpation in the lower abdomen. No guarding or rebound. Patient states the pain comes and goes. Describes as bad cramps.   Genitourinary:  External genitalia without lesions. Large blood with clots vaginal vault. Cervix open with POC.   Musculoskeletal: Normal range of motion.  Neurological: She is alert and oriented to person, place, and time. No cranial nerve deficit.  Skin: Skin is warm and dry.  Psychiatric: She has a normal mood and affect. Her behavior is normal.    ED Course  Procedures IV NS 1000 ccs bolus, labs, GYN consult  Using ring forceps the sac filled with amnionic fluid and a 7 week 6 day fetus was removed with sac intact. Then the placenta was extracted using the ring forceps. Patient tolerated the procedure without problems. After POC removed the bleeding is minimal and no cramping at this time.    Results for orders placed during the hospital encounter of 12/10/13 (from the past 24 hour(s))  CBC WITH DIFFERENTIAL     Status: Abnormal   Collection Time    12/10/13  3:15 PM      Result Value Ref Range   WBC 11.7 (*) 4.0 - 10.5  K/uL   RBC 3.87  3.87 - 5.11 MIL/uL   Hemoglobin 12.4  12.0 - 15.0 g/dL   HCT 36.3  36.0 - 46.0 %   MCV 93.8  78.0 - 100.0 fL   MCH 32.0  26.0 - 34.0 pg   MCHC 34.2  30.0 - 36.0 g/dL   RDW 13.2  11.5 - 15.5 %   Platelets 255  150 - 400 K/uL   Neutrophils Relative % 77  43 - 77 %   Neutro Abs 8.9 (*) 1.7 - 7.7 K/uL   Lymphocytes Relative 17  12 - 46 %   Lymphs Abs 2.0  0.7 - 4.0 K/uL   Monocytes Relative 5  3 - 12 %  Monocytes Absolute 0.6  0.1 - 1.0 K/uL   Eosinophils Relative 1  0 - 5 %   Eosinophils Absolute 0.1  0.0 - 0.7 K/uL   Basophils Relative 0  0 - 1 %   Basophils Absolute 0.0  0.0 - 0.1 K/uL  BASIC METABOLIC PANEL     Status: Abnormal   Collection Time    12/10/13  3:15 PM      Result Value Ref Range   Sodium 140  137 - 147 mEq/L   Potassium 4.1  3.7 - 5.3 mEq/L   Chloride 105  96 - 112 mEq/L   CO2 24  19 - 32 mEq/L   Glucose, Bld 136 (*) 70 - 99 mg/dL   BUN 10  6 - 23 mg/dL   Creatinine, Ser 0.68  0.50 - 1.10 mg/dL   Calcium 8.9  8.4 - 10.5 mg/dL   GFR calc non Af Amer >90  >90 mL/min   GFR calc Af Amer >90  >90 mL/min   Anion gap 11  5 - 15  HCG, QUANTITATIVE, PREGNANCY     Status: Abnormal   Collection Time    12/10/13  3:15 PM      Result Value Ref Range   hCG, Beta Chain, Quant, S 2215 (*) <5 mIU/mL  ABO/RH     Status: None   Collection Time    12/10/13  3:15 PM      Result Value Ref Range   ABO/RH(D) A POS      MDM   DATING AND VIABILITY SONOGRAM  Kristen Dunn is a 38 y.o. year old G2P1001 with an EDD of 06/23/2014 which would correlate to [redacted]w[redacted]d weeks gestation. She is here today for some vaginal spotting noted this morning.  GESTATION:  SINGLETON  FETAL ACTIVITY:  Heart rate NO FCA NOTED  The fetus is inactive.  CERVIX:  Appears to be closed  ADNEXA:  The ovaries are abnormal - Lt ovary with 4.1 x 3.1cm cyst noted .no free fluid noted within pelvis  GESTATIONAL AGE AND BIOMETRICS:  Gestational criteria: Estimated Date of Delivery: 06/23/14 by  early ultrasound now at [redacted]w[redacted]d  Previous Scans:1    GESTATIONAL SAC  mm  weeks    CROWN RUMP LENGTH  15.1 mm  7+6 weeks                                       AVERAGE EGA(BY THIS SCAN): 7+6 weeks  TECHNICIAN COMMENTS:  U/S(11+3wks)- single IUP with NO FCA NOTED, CRL c/w 7+6wks, no areas of hemorrhage noted, cx appears closed, RT ovary appears WNL, Lt ovary with 4.1 x 3.1cm cystic area noted, no free fluid noted within the pelvis  A copy of this report including all images has been saved and backed up to a second source for retrieval if needed. All measures and details of the anatomical scan, placentation, fluid volume and pelvic anatomy are contained in that report.  Kristen Dunn  12/05/2013  9:49 AM  Clinical Impression and recommendations:  I have reviewed the sonogram results above, combined with the patient's current clinical course, below are my impressions and any appropriate recommendations for management based on the sonographic findings.  Non Viable early IUP measuring [redacted]w[redacted]d  Left ovarian cyst  EURE,LUTHER H  12/05/2013  9:53 AM  EURE,LUTHER H  12/05/2013  9:53 AM       US OB Transvaginal (  Order [id number])  Imaging   38 y.o. G2P1001 with IUFD @ 7 weeks and 6 days gestation with abdominal cramping and heavy bleeding. Stable for discharge without pain and with minimal bleeding at this time. Consult with Dr. Elonda Husky and will start Methergine for 6 doses. Patient will follow up in the office. I have reviewed this patient's vital signs, nurses notes, appropriate labs and imaging.  I have discussed findings and plan of care with the patient and she voices understanding.    Medication List    TAKE these medications       methylergonovine 0.2 MG tablet  Commonly known as:  METHERGINE  Take 1 tablet (0.2 mg total) by mouth every 6 (six) hours.      ASK your doctor about these medications       acetaminophen 325 MG tablet  Commonly known as:  TYLENOL  Take 650 mg by mouth  every 6 (six) hours as needed.     ACIDOPHILUS PO  Take 1 tablet by mouth daily.     albuterol 108 (90 BASE) MCG/ACT inhaler  Commonly known as:  PROVENTIL HFA;VENTOLIN HFA  Inhale 1 puff into the lungs every 6 (six) hours as needed. Patient states she very seldom has to use, but she does keep one on hand     ALEVE 220 MG tablet  Generic drug:  naproxen sodium  Take 220 mg by mouth daily as needed (for pain).     Doxylamine-Pyridoxine 10-10 MG Tbec  Commonly known as:  DICLEGIS  2 tabs q hs, if sx persist add 1 tab q am on day 3, if sx persist add 1 tab q afternoon on day 4     HYDROCODONE-APAP-DIETARY PROD PO  Take 1 tablet by mouth once.     PRENATAL VITAMIN PO  Take by mouth daily.     SUMAtriptan 50 MG tablet  Commonly known as:  IMITREX  Take 1 tablet (50 mg total) by mouth every 2 (two) hours as needed for migraine.          [place], [place] 12/10/13 [phone]

## 2013-12-10 NOTE — ED Notes (Signed)
PT stated she went to [facility] Monday and was told she had an 10wk old fetus that was deceased and did not need a d&c at this time. PT began heavily bleeding yesterday and today pt c/o dizziness, lower abdominal pain, nausea and loss of balance.

## 2013-12-10 NOTE — Discharge Instructions (Signed)
Call Dr. Brynda Greathouse office to schedule a follow up appointment. Return here as needed. Take the medication as directed to help with the bleeding. Take ibuprofen as needed for cramping.

## 2013-12-10 NOTE — ED Notes (Signed)
MD at bedside. 

## 2013-12-12 NOTE — ED Provider Notes (Signed)
Medical screening examination/treatment/procedure(s) were conducted as a shared visit with non-physician practitioner(s) and myself.  I personally evaluated the patient during the encounter.  Hx IUFD at 7 weeks presenting with lower abdominal cramping and vaginal bleeding.  BP 90-100s but HR 50-60s.  Lower abdominal tenderness without peritoneal signs.    EKG Interpretation None     BP 99/66  Pulse 58  Temp(Src) 97.8 F (36.6 C) (Oral)  Resp 11  Ht 5\' 5"  (1.651 m)  Wt 180 lb (81.647 kg)  BMI 29.95 kg/m2  SpO2 100%  LMP 09/01/2013   Ezequiel Essex, MD 12/12/13 1324

## 2013-12-13 NOTE — ED Provider Notes (Signed)
Medical screening examination/treatment/procedure(s) were performed by non-physician practitioner and as supervising physician I was immediately available for consultation/collaboration.   EKG Interpretation None       Nat Christen, MD 12/13/13 1027

## 2013-12-15 ENCOUNTER — Ambulatory Visit (INDEPENDENT_AMBULATORY_CARE_PROVIDER_SITE_OTHER): Payer: BC Managed Care – PPO | Admitting: Obstetrics & Gynecology

## 2013-12-15 ENCOUNTER — Encounter: Payer: Self-pay | Admitting: Obstetrics & Gynecology

## 2013-12-15 VITALS — BP 140/100 | Wt 187.0 lb

## 2013-12-15 DIAGNOSIS — O039 Complete or unspecified spontaneous abortion without complication: Secondary | ICD-10-CM

## 2013-12-15 MED ORDER — DESOGESTREL-ETHINYL ESTRADIOL 0.15-0.02/0.01 MG (21/5) PO TABS: 1.0000 | ORAL_TABLET | Freq: Every day | ORAL | Status: DC

## 2013-12-15 NOTE — Progress Notes (Signed)
Patient ID: Kristen Dunn, female   DOB: 27-Oct-1975, 38 y.o.   MRN: [id number] Pt had a spontaneous pregnancy loss this past Saturday Had heavy bleeding with cramping and definitive pregnancy passage  Since then bleeding has gotten better Cramping better  Begin desogestrel 20 mics EE  Follow up prn

## 2014-01-25 ENCOUNTER — Telehealth: Payer: Self-pay | Admitting: Obstetrics & Gynecology

## 2014-01-25 MED ORDER — LORAZEPAM 0.5 MG PO TABS: 0.5000 mg | ORAL_TABLET | Freq: Three times a day (TID) | ORAL | Status: DC

## 2014-01-25 NOTE — Telephone Encounter (Signed)
Pt states Dr. Elonda Husky states if pt was to need medication for anxiety to call. Pt had MAB 12/15/2013.

## 2014-01-25 NOTE — Telephone Encounter (Signed)
Having panic like attacks will try ativan if not better may try zoloft

## 2014-02-20 ENCOUNTER — Encounter: Payer: Self-pay | Admitting: Obstetrics & Gynecology

## 2014-02-21 ENCOUNTER — Other Ambulatory Visit: Payer: Self-pay | Admitting: Adult Health

## 2014-03-30 ENCOUNTER — Other Ambulatory Visit (INDEPENDENT_AMBULATORY_CARE_PROVIDER_SITE_OTHER): Payer: Self-pay

## 2014-03-30 DIAGNOSIS — R1084 Generalized abdominal pain: Secondary | ICD-10-CM

## 2014-03-30 DIAGNOSIS — Z9884 Bariatric surgery status: Secondary | ICD-10-CM

## 2014-03-30 DIAGNOSIS — K909 Intestinal malabsorption, unspecified: Secondary | ICD-10-CM

## 2014-04-05 ENCOUNTER — Ambulatory Visit
Admission: RE | Admit: 2014-04-05 | Discharge: 2014-04-05 | Disposition: A | Discharge status: Home / Self Care | Payer: BC Managed Care – PPO | Source: Ambulatory Visit | Attending: General Surgery | Admitting: General Surgery

## 2014-04-05 DIAGNOSIS — Z9884 Bariatric surgery status: Secondary | ICD-10-CM

## 2014-04-05 DIAGNOSIS — R1084 Generalized abdominal pain: Secondary | ICD-10-CM

## 2014-04-05 MED ORDER — IOHEXOL 300 MG/ML  SOLN: 100.0000 mL | Freq: Once | INTRAMUSCULAR | Status: AC | PRN

## 2014-04-06 LAB — CBC WITH DIFFERENTIAL/PLATELET
BASOS ABS: 0 10*3/uL (ref 0.0–0.1)
Basophils Relative: 1 % (ref 0–1)
EOS PCT: 3 % (ref 0–5)
Eosinophils Absolute: 0.1 10*3/uL (ref 0.0–0.7)
HEMATOCRIT: 42.3 % (ref 36.0–46.0)
Hemoglobin: 13.9 g/dL (ref 12.0–15.0)
LYMPHS ABS: 1.4 10*3/uL (ref 0.7–4.0)
LYMPHS PCT: 36 % (ref 12–46)
MCH: 31.5 pg (ref 26.0–34.0)
MCHC: 32.9 g/dL (ref 30.0–36.0)
MCV: 95.9 fL (ref 78.0–100.0)
MONO ABS: 0.2 10*3/uL (ref 0.1–1.0)
MONOS PCT: 5 % (ref 3–12)
MPV: 9 fL — ABNORMAL LOW (ref 9.4–12.4)
Neutro Abs: 2.2 10*3/uL (ref 1.7–7.7)
Neutrophils Relative %: 55 % (ref 43–77)
PLATELETS: 337 10*3/uL (ref 150–400)
RBC: 4.41 MIL/uL (ref 3.87–5.11)
RDW: 13.3 % (ref 11.5–15.5)
WBC: 4 10*3/uL (ref 4.0–10.5)

## 2014-04-07 LAB — IRON AND TIBC
%SAT: 27 % (ref 20–55)
Iron: 121 ug/dL (ref 42–145)
TIBC: 453 ug/dL (ref 250–470)
UIBC: 332 ug/dL (ref 125–400)

## 2014-04-07 LAB — COMPREHENSIVE METABOLIC PANEL
ALBUMIN: 3.9 g/dL (ref 3.5–5.2)
ALT: 9 U/L (ref 0–35)
AST: 16 U/L (ref 0–37)
Alkaline Phosphatase: 51 U/L (ref 39–117)
BUN: 12 mg/dL (ref 6–23)
CALCIUM: 9.4 mg/dL (ref 8.4–10.5)
CHLORIDE: 103 meq/L (ref 96–112)
CO2: 24 meq/L (ref 19–32)
Creat: 0.93 mg/dL (ref 0.50–1.10)
Glucose, Bld: 95 mg/dL (ref 70–99)
POTASSIUM: 4.8 meq/L (ref 3.5–5.3)
Sodium: 139 mEq/L (ref 135–145)
Total Bilirubin: 0.8 mg/dL (ref 0.2–1.2)
Total Protein: 6.8 g/dL (ref 6.0–8.3)

## 2014-04-07 LAB — VITAMIN B12: Vitamin B-12: 341 pg/mL (ref 211–911)

## 2014-04-07 LAB — FOLATE: Folate: >20 ng/mL

## 2014-04-10 LAB — VITAMIN B1: Vitamin B1 (Thiamine): 16 nmol/L (ref 8–30)

## 2014-12-05 ENCOUNTER — Encounter: Payer: Self-pay | Admitting: Obstetrics & Gynecology

## 2014-12-20 ENCOUNTER — Ambulatory Visit (INDEPENDENT_AMBULATORY_CARE_PROVIDER_SITE_OTHER): Payer: BLUE CROSS/BLUE SHIELD

## 2014-12-20 ENCOUNTER — Other Ambulatory Visit: Payer: Self-pay | Admitting: Obstetrics & Gynecology

## 2014-12-20 ENCOUNTER — Telehealth: Payer: Self-pay | Admitting: Adult Health

## 2014-12-20 DIAGNOSIS — N831 Corpus luteum cyst: Secondary | ICD-10-CM

## 2014-12-20 DIAGNOSIS — Z331 Pregnant state, incidental: Secondary | ICD-10-CM

## 2014-12-20 DIAGNOSIS — O3680X0 Pregnancy with inconclusive fetal viability, not applicable or unspecified: Secondary | ICD-10-CM

## 2014-12-20 NOTE — Telephone Encounter (Signed)
Spoke with pt. Pt has noticed a sharp pain in left side. No bleeding. She has had a + home pregnancy test. I spoke with Maudie Mercury, CNM and she advised if the pain was severe, she needs an [place] to check for ectopic pregnancy. Pt to come in this afternoon for [place]. Pt voiced understanding. Call transferred to front desk for appt. [place]

## 2014-12-20 NOTE — Progress Notes (Signed)
[place] 7+2wks single IUP w/ys, pos fht 136bpm,crl 7.62mm,normal rt ov, lt ov contains a 2.5 x 2.3 x 2.3cm complex corpus luteal cyst and a 4.6 x 3.5 x 3.6cm simple cyst,lt ov has venous and art flow,small amount of cul de sac fluid ,Maudie Mercury was informed about results and pt will be seen on 12/26/2014.

## 2014-12-26 ENCOUNTER — Ambulatory Visit: Payer: Self-pay | Admitting: Adult Health

## 2015-01-01 ENCOUNTER — Encounter: Payer: Self-pay | Admitting: Adult Health

## 2015-01-01 ENCOUNTER — Ambulatory Visit (INDEPENDENT_AMBULATORY_CARE_PROVIDER_SITE_OTHER): Payer: BLUE CROSS/BLUE SHIELD | Admitting: Adult Health

## 2015-01-01 VITALS — BP 122/80 | HR 92 | Wt 184.5 lb

## 2015-01-01 DIAGNOSIS — Z369 Encounter for antenatal screening, unspecified: Secondary | ICD-10-CM

## 2015-01-01 DIAGNOSIS — O0990 Supervision of high risk pregnancy, unspecified, unspecified trimester: Secondary | ICD-10-CM | POA: Insufficient documentation

## 2015-01-01 DIAGNOSIS — Z8632 Personal history of gestational diabetes: Secondary | ICD-10-CM

## 2015-01-01 DIAGNOSIS — Z1389 Encounter for screening for other disorder: Secondary | ICD-10-CM

## 2015-01-01 DIAGNOSIS — Z3481 Encounter for supervision of other normal pregnancy, first trimester: Secondary | ICD-10-CM

## 2015-01-01 DIAGNOSIS — O09291 Supervision of pregnancy with other poor reproductive or obstetric history, first trimester: Secondary | ICD-10-CM

## 2015-01-01 DIAGNOSIS — O09521 Supervision of elderly multigravida, first trimester: Secondary | ICD-10-CM

## 2015-01-01 DIAGNOSIS — Z3682 Encounter for antenatal screening for nuchal translucency: Secondary | ICD-10-CM

## 2015-01-01 DIAGNOSIS — Z1329 Encounter for screening for other suspected endocrine disorder: Secondary | ICD-10-CM

## 2015-01-01 DIAGNOSIS — Z0283 Encounter for blood-alcohol and blood-drug test: Secondary | ICD-10-CM

## 2015-01-01 DIAGNOSIS — Z331 Pregnant state, incidental: Secondary | ICD-10-CM

## 2015-01-01 LAB — POCT URINALYSIS DIPSTICK
Glucose, UA: NEGATIVE
Ketones, UA: NEGATIVE
LEUKOCYTES UA: NEGATIVE
NITRITE UA: NEGATIVE
Protein, UA: NEGATIVE

## 2015-01-01 NOTE — Patient Instructions (Signed)
First Trimester of Pregnancy The first trimester of pregnancy is from week 1 until the end of week 12 (months 1 through 3). A week after a sperm fertilizes an egg, the egg will implant on the wall of the uterus. This embryo will begin to develop into a baby. Genes from you and your partner are forming the baby. The female genes determine whether the baby is a boy or a girl. At 6-8 weeks, the eyes and face are formed, and the heartbeat can be seen on ultrasound. At the end of 12 weeks, all the baby's organs are formed.  Now that you are pregnant, you will want to do everything you can to have a healthy baby. Two of the most important things are to get good prenatal care and to follow your health care provider's instructions. Prenatal care is all the medical care you receive before the baby's birth. This care will help prevent, find, and treat any problems during the pregnancy and childbirth. BODY CHANGES Your body goes through many changes during pregnancy. The changes vary from woman to woman.   You may gain or lose a couple of pounds at first.  You may feel sick to your stomach (nauseous) and throw up (vomit). If the vomiting is uncontrollable, call your health care provider.  You may tire easily.  You may develop headaches that can be relieved by medicines approved by your health care provider.  You may urinate more often. Painful urination may mean you have a bladder infection.  You may develop heartburn as a result of your pregnancy.  You may develop constipation because certain hormones are causing the muscles that push waste through your intestines to slow down.  You may develop hemorrhoids or swollen, bulging veins (varicose veins).  Your breasts may begin to grow larger and become tender. Your nipples may stick out more, and the tissue that surrounds them (areola) may become darker.  Your gums may bleed and may be sensitive to brushing and flossing.  Dark spots or blotches (chloasma,  mask of pregnancy) may develop on your face. This will likely fade after the baby is born.  Your menstrual periods will stop.  You may have a loss of appetite.  You may develop cravings for certain kinds of food.  You may have changes in your emotions from day to day, such as being excited to be pregnant or being concerned that something may go wrong with the pregnancy and baby.  You may have more vivid and strange dreams.  You may have changes in your hair. These can include thickening of your hair, rapid growth, and changes in texture. Some women also have hair loss during or after pregnancy, or hair that feels dry or thin. Your hair will most likely return to normal after your baby is born. WHAT TO EXPECT AT YOUR PRENATAL VISITS During a routine prenatal visit:  You will be weighed to make sure you and the baby are growing normally.  Your blood pressure will be taken.  Your abdomen will be measured to track your baby's growth.  The fetal heartbeat will be listened to starting around week 10 or 12 of your pregnancy.  Test results from any previous visits will be discussed. Your health care provider may ask you:  How you are feeling.  If you are feeling the baby move.  If you have had any abnormal symptoms, such as leaking fluid, bleeding, severe headaches, or abdominal cramping.  If you have any questions. Other tests   that may be performed during your first trimester include:  Blood tests to find your blood type and to check for the presence of any previous infections. They will also be used to check for low iron levels (anemia) and Rh antibodies. Later in the pregnancy, blood tests for diabetes will be done along with other tests if problems develop.  Urine tests to check for infections, diabetes, or protein in the urine.  An ultrasound to confirm the proper growth and development of the baby.  An amniocentesis to check for possible genetic problems.  Fetal screens for  spina bifida and Down syndrome.  You may need other tests to make sure you and the baby are doing well. HOME CARE INSTRUCTIONS  Medicines  Follow your health care provider's instructions regarding medicine use. Specific medicines may be either safe or unsafe to take during pregnancy.  Take your prenatal vitamins as directed.  If you develop constipation, try taking a stool softener if your health care provider approves. Diet  Eat regular, well-balanced meals. Choose a variety of foods, such as meat or vegetable-based protein, fish, milk and low-fat dairy products, vegetables, fruits, and whole grain breads and cereals. Your health care provider will help you determine the amount of weight gain that is right for you.  Avoid raw meat and uncooked cheese. These carry germs that can cause birth defects in the baby.  Eating four or five small meals rather than three large meals a day may help relieve nausea and vomiting. If you start to feel nauseous, eating a few soda crackers can be helpful. Drinking liquids between meals instead of during meals also seems to help nausea and vomiting.  If you develop constipation, eat more high-fiber foods, such as fresh vegetables or fruit and whole grains. Drink enough fluids to keep your urine clear or pale yellow. Activity and Exercise  Exercise only as directed by your health care provider. Exercising will help you:  Control your weight.  Stay in shape.  Be prepared for labor and delivery.  Experiencing pain or cramping in the lower abdomen or low back is a good sign that you should stop exercising. Check with your health care provider before continuing normal exercises.  Try to avoid standing for long periods of time. Move your legs often if you must stand in one place for a long time.  Avoid heavy lifting.  Wear low-heeled shoes, and practice good posture.  You may continue to have sex unless your health care provider directs you  otherwise. Relief of Pain or Discomfort  Wear a good support bra for breast tenderness.   Take warm sitz baths to soothe any pain or discomfort caused by hemorrhoids. Use hemorrhoid cream if your health care provider approves.   Rest with your legs elevated if you have leg cramps or low back pain.  If you develop varicose veins in your legs, wear support hose. Elevate your feet for 15 minutes, 3-4 times a day. Limit salt in your diet. Prenatal Care  Schedule your prenatal visits by the twelfth week of pregnancy. They are usually scheduled monthly at first, then more often in the last 2 months before delivery.  Write down your questions. Take them to your prenatal visits.  Keep all your prenatal visits as directed by your health care provider. Safety  Wear your seat belt at all times when driving.  Make a list of emergency phone numbers, including numbers for family, friends, the hospital, and police and fire departments. General Tips    Ask your health care provider for a referral to a local prenatal education class. Begin classes no later than at the beginning of month 6 of your pregnancy.  Ask for help if you have counseling or nutritional needs during pregnancy. Your health care provider can offer advice or refer you to specialists for help with various needs.  Do not use hot tubs, steam rooms, or saunas.  Do not douche or use tampons or scented sanitary pads.  Do not cross your legs for long periods of time.  Avoid cat litter boxes and soil used by cats. These carry germs that can cause birth defects in the baby and possibly loss of the fetus by miscarriage or stillbirth.  Avoid all smoking, herbs, alcohol, and medicines not prescribed by your health care provider. Chemicals in these affect the formation and growth of the baby.  Schedule a dentist appointment. At home, brush your teeth with a soft toothbrush and be gentle when you floss. SEEK MEDICAL CARE IF:   You have  dizziness.  You have mild pelvic cramps, pelvic pressure, or nagging pain in the abdominal area.  You have persistent nausea, vomiting, or diarrhea.  You have a bad smelling vaginal discharge.  You have pain with urination.  You notice increased swelling in your face, hands, legs, or ankles. SEEK IMMEDIATE MEDICAL CARE IF:   You have a fever.  You are leaking fluid from your vagina.  You have spotting or bleeding from your vagina.  You have severe abdominal cramping or pain.  You have rapid weight gain or loss.  You vomit blood or material that looks like coffee grounds.  You are exposed to [place] measles and have never had them.  You are exposed to fifth disease or chickenpox.  You develop a severe headache.  You have shortness of breath.  You have any kind of trauma, such as from a fall or a car accident. Document Released: 04/01/2001 Document Revised: 08/22/2013 Document Reviewed: 02/15/2013 [facility] Patient Information 2015 [place], [place]. This information is not intended to replace advice given to you by your health care provider. Make sure you discuss any questions you have with your health care provider. Return in 3 weeks for IT/NT and 2 hour sugar test

## 2015-01-01 NOTE — Progress Notes (Signed)
Subjective:  Kristen Dunn is a 39 y.o. G29P1001 Caucasian female at [redacted]w[redacted]d by LMP and [place] being seen today for her first obstetrical visit.  Her obstetrical history is significant for advanced maternal age, hx gestational diabetes,prior C-section for FTP. Had gastric gas by pass 2008. Pregnancy history fully reviewed.  Patient reports breast tenderness and a little nausea, has constipation. Denies vb, cramping, uti s/s, abnormal/malodorous vag d/c, or vulvovaginal itching/irritation.  BP 122/80 mmHg  Pulse 92  Wt 184 lb 8 oz (83.689 kg)  LMP 10/30/2014  HISTORY: OB History  Gravida Para Term Preterm AB SAB TAB Ectopic Multiple Living  3 1 1  0 0 0 0 0 0 1    # Outcome Date GA Lbr Len/2nd Weight Sex Delivery Anes PTL Lv  3 Current           2 Term 01/13/12 [redacted]w[redacted]d  8 lb 5.9 oz (3.795 kg) M CS-LTranv EPI  Y  1 Gravida              Past Medical History  Diagnosis Date  . Asthma   . Anemia   . AMA (advanced maternal age) multigravida 90+   . Gestational diabetes     glyburide  . PONV (postoperative nausea and vomiting)   . Hx of migraines 10/26/2012  . Other and unspecified ovarian cyst 03/30/2013   Past Surgical History  Procedure Laterality Date  . Gastric bypass 2008    . Tonsillectomy    . Cesarean section  01/13/2012    Procedure: CESAREAN SECTION;  Surgeon: Mora Bellman, MD;  Location: [facility];  Service: Obstetrics;  Laterality: N/A;  . Incisional hernia repair  03/31/2012    Procedure: LAPAROSCOPIC INCISIONAL HERNIA;  Surgeon: Edward Jolly, MD;  Location: [facility];  Service: General;  Laterality: N/A;  LAPAROSCOPY REPAIR INTERNAL HERNIA  petersons defect  . Laparoscopic gastric banding with hiatal hernia repair N/A 03/14/2013    Procedure: DIAGNOSTIC LAPAROSCOPY  WITH  INTERNAL HERNIA  REDUCTION WITH CLOSURE;  Surgeon: Shann Medal, MD;  Location: [facility];  Service: General;  Laterality: N/A;   Family History  Problem Relation Age of Onset  . COPD Mother   . Asthma  Mother   . Hypertension Father   . Aneurysm Father   . Asthma Sister   . Thyroid disease Maternal Grandmother   . Stroke Maternal Grandmother   . Von Willebrand disease Cousin   . Cancer Maternal Aunt     breast    Exam   System:     General: Well developed & nourished, no acute distress   Skin: Warm & dry, normal coloration and turgor, no rashes   Neurologic: Alert & oriented, normal mood   Cardiovascular: Regular rate & rhythm   Respiratory: Effort & rate normal, LCTAB, acyanotic   Abdomen: Soft, non tender   Extremities: normal strength, tone                                  Thyroid normal Pelvic Exam:    Perineum: deferred   Vulva: deferred   Vagina:  deferred   Cervix: deferred   Uterus: deferred   Thin prep pap smear done 06/13/13 normal with negative HPV. FHR: 180  via [place]   Assessment:   Pregnancy: G3P1001 Patient Active Problem List   Diagnosis Date Noted  . Supervision of other normal pregnancy 01/01/2015  . Rubella non-immune status, antepartum 11/08/2013  .  History of gestational diabetes in prior pregnancy, currently pregnant 11/07/2013  . AMA (advanced maternal age) multigravida 35+ 11/07/2013  . Previous cesarean section complicating pregnancy 16/38/4536  . Other and unspecified ovarian cyst 03/30/2013  . Internal hernia 03/15/2013  . Abdominal pain 03/14/2013  . Hx of migraines 10/26/2012    [redacted]w[redacted]d G3P1001 New OB visit     Plan:  Initial labs drawn Continue prenatal vitamins Problem list reviewed and updated Reviewed n/v relief measures and warning s/s to report Reviewed recommended weight gain based on pre-gravid BMI Encouraged well-balanced diet Genetic Screening discussed Integrated Screen: requested Cystic fibrosis screening discussed declined Ultrasound discussed; fetal survey: requested Follow up in 3 weeks for IT and NT 2 hour GTT Can take miralax if needed or senekot.  Estill Dooms, NP 01/01/2015 3:52 PM

## 2015-01-02 LAB — PMP SCREEN PROFILE (10S), URINE
AMPHETAMINE SCRN UR: NEGATIVE ng/mL
Barbiturate Screen, Ur: NEGATIVE ng/mL
Benzodiazepine Screen, Urine: NEGATIVE ng/mL
CANNABINOIDS UR QL SCN: NEGATIVE ng/mL
Cocaine(Metab.)Screen, Urine: NEGATIVE ng/mL
Creatinine(Crt), U: 81.8 mg/dL (ref 20.0–300.0)
METHADONE SCREEN, URINE: NEGATIVE ng/mL
OPIATE SCRN UR: NEGATIVE ng/mL
OXYCODONE+OXYMORPHONE UR QL SCN: NEGATIVE ng/mL
PCP SCRN UR: NEGATIVE ng/mL
PH UR, DRUG SCRN: 5.4 (ref 4.5–8.9)
Propoxyphene, Screen: NEGATIVE ng/mL

## 2015-01-02 LAB — MICROSCOPIC EXAMINATION: Casts: NONE SEEN /lpf

## 2015-01-02 LAB — URINALYSIS, ROUTINE W REFLEX MICROSCOPIC
BILIRUBIN UA: NEGATIVE
Glucose, UA: NEGATIVE
Ketones, UA: NEGATIVE
Nitrite, UA: NEGATIVE
PH UA: 6 (ref 5.0–7.5)
PROTEIN UA: NEGATIVE
RBC, UA: NEGATIVE
SPEC GRAV UA: 1.018 (ref 1.005–1.030)
Urobilinogen, Ur: 1 mg/dL (ref 0.2–1.0)

## 2015-01-02 LAB — GC/CHLAMYDIA PROBE AMP
CHLAMYDIA, DNA PROBE: NEGATIVE
Neisseria gonorrhoeae by PCR: NEGATIVE

## 2015-01-02 LAB — ABO/RH: Rh Factor: POSITIVE

## 2015-01-02 LAB — CBC
HEMATOCRIT: 41.4 % (ref 34.0–46.6)
HEMOGLOBIN: 14.1 g/dL (ref 11.1–15.9)
MCH: 32.3 pg (ref 26.6–33.0)
MCHC: 34.1 g/dL (ref 31.5–35.7)
MCV: 95 fL (ref 79–97)
Platelets: 348 10*3/uL (ref 150–379)
RBC: 4.37 x10E6/uL (ref 3.77–5.28)
RDW: 12.7 % (ref 12.3–15.4)
WBC: 8 10*3/uL (ref 3.4–10.8)

## 2015-01-02 LAB — TSH: TSH: 0.229 u[IU]/mL — AB (ref 0.450–4.500)

## 2015-01-02 LAB — ANTIBODY SCREEN: Antibody Screen: NEGATIVE

## 2015-01-02 LAB — RUBELLA SCREEN: Rubella Antibodies, IGG: 1.17 index (ref >0.99)

## 2015-01-02 LAB — HIV ANTIBODY (ROUTINE TESTING W REFLEX): HIV Screen 4th Generation wRfx: NONREACTIVE

## 2015-01-02 LAB — HEPATITIS B SURFACE ANTIGEN: Hepatitis B Surface Ag: NEGATIVE

## 2015-01-02 LAB — RPR: RPR: NONREACTIVE

## 2015-01-02 LAB — VARICELLA ZOSTER ANTIBODY, IGG: Varicella zoster IgG: 962 index (ref >165)

## 2015-01-03 LAB — URINE CULTURE

## 2015-01-25 ENCOUNTER — Ambulatory Visit (INDEPENDENT_AMBULATORY_CARE_PROVIDER_SITE_OTHER): Payer: BLUE CROSS/BLUE SHIELD | Admitting: Obstetrics & Gynecology

## 2015-01-25 ENCOUNTER — Other Ambulatory Visit: Payer: BLUE CROSS/BLUE SHIELD

## 2015-01-25 ENCOUNTER — Ambulatory Visit (INDEPENDENT_AMBULATORY_CARE_PROVIDER_SITE_OTHER): Payer: BLUE CROSS/BLUE SHIELD

## 2015-01-25 ENCOUNTER — Encounter: Payer: Self-pay | Admitting: Obstetrics & Gynecology

## 2015-01-25 VITALS — BP 124/80 | HR 92 | Wt 184.0 lb

## 2015-01-25 DIAGNOSIS — Z369 Encounter for antenatal screening, unspecified: Secondary | ICD-10-CM

## 2015-01-25 DIAGNOSIS — Z1389 Encounter for screening for other disorder: Secondary | ICD-10-CM

## 2015-01-25 DIAGNOSIS — Z331 Pregnant state, incidental: Secondary | ICD-10-CM

## 2015-01-25 DIAGNOSIS — Z8632 Personal history of gestational diabetes: Secondary | ICD-10-CM

## 2015-01-25 DIAGNOSIS — Z3493 Encounter for supervision of normal pregnancy, unspecified, third trimester: Secondary | ICD-10-CM

## 2015-01-25 DIAGNOSIS — Z3682 Encounter for antenatal screening for nuchal translucency: Secondary | ICD-10-CM

## 2015-01-25 DIAGNOSIS — Z36 Encounter for antenatal screening of mother: Secondary | ICD-10-CM

## 2015-01-25 LAB — POCT URINALYSIS DIPSTICK
Ketones, UA: NEGATIVE
NITRITE UA: NEGATIVE
PROTEIN UA: NEGATIVE

## 2015-01-25 NOTE — Progress Notes (Signed)
[place] 12+3wks,measurements c/w dates,crl 57.45mm,NT 1.11mm,NB present,fhr 172 bpm,normal rt ov,simple lt ov cyst (#1) 4.2 x 4.6 x 2.8cm,(#2) complex cyst 2.1 x 1.5 x 1.1cm

## 2015-01-25 NOTE — Progress Notes (Signed)
G3P1001 [redacted]w[redacted]d Estimated Date of Delivery: 08/06/15  Blood pressure 124/80, pulse 92, weight 184 lb (83.462 kg), last menstrual period 10/30/2014, unknown if currently breastfeeding.   BP weight and urine results all reviewed and noted.  Please refer to the obstetrical flow sheet for the fundal height and fetal heart rate documentation:  Patient reports good fetal movement, denies any bleeding and no rupture of membranes symptoms or regular contractions. Patient is without complaints. All questions were answered.  Orders Placed This Encounter  Procedures  . Maternal Screen, Integrated #1  . POCT Urinalysis Dipstick    Plan:  Continued routine obstetrical care,   Return in about 4 weeks (around 02/22/2015) for LROB.

## 2015-01-26 LAB — GLUCOSE TOLERANCE, 2 HOURS W/ 1HR
GLUCOSE, 2 HOUR: 59 mg/dL — AB (ref 65–152)
Glucose, 1 hour: 68 mg/dL (ref 65–179)
Glucose, Fasting: 85 mg/dL (ref 65–91)

## 2015-01-27 LAB — MATERNAL SCREEN, INTEGRATED #1
Crown Rump Length: 57.3 mm
Gest. Age on Collection Date: 12.3 weeks
MATERNAL AGE AT EDD: 41 a
NUCHAL TRANSLUCENCY (NT): 1.1 mm
NUMBER OF FETUSES: 1
PAPP-A VALUE: 1166.5 ng/mL
Weight: 184 [lb_av]

## 2015-01-29 ENCOUNTER — Telehealth: Payer: Self-pay | Admitting: Adult Health

## 2015-01-29 NOTE — Telephone Encounter (Signed)
Pt aware passed 2 hr GTT 85/68/59

## 2015-02-22 ENCOUNTER — Encounter: Payer: BLUE CROSS/BLUE SHIELD | Admitting: Advanced Practice Midwife

## 2015-02-22 ENCOUNTER — Encounter: Payer: Self-pay | Admitting: Advanced Practice Midwife

## 2015-02-22 ENCOUNTER — Ambulatory Visit (INDEPENDENT_AMBULATORY_CARE_PROVIDER_SITE_OTHER): Payer: BLUE CROSS/BLUE SHIELD | Admitting: Advanced Practice Midwife

## 2015-02-22 VITALS — BP 110/70 | HR 75 | Wt 186.0 lb

## 2015-02-22 DIAGNOSIS — Z1389 Encounter for screening for other disorder: Secondary | ICD-10-CM

## 2015-02-22 DIAGNOSIS — Z363 Encounter for antenatal screening for malformations: Secondary | ICD-10-CM

## 2015-02-22 DIAGNOSIS — Z369 Encounter for antenatal screening, unspecified: Secondary | ICD-10-CM

## 2015-02-22 DIAGNOSIS — Z3493 Encounter for supervision of normal pregnancy, unspecified, third trimester: Secondary | ICD-10-CM

## 2015-02-22 DIAGNOSIS — Z331 Pregnant state, incidental: Secondary | ICD-10-CM

## 2015-02-22 LAB — POCT URINALYSIS DIPSTICK
GLUCOSE UA: NEGATIVE
Ketones, UA: NEGATIVE
Leukocytes, UA: NEGATIVE
NITRITE UA: NEGATIVE
PROTEIN UA: NEGATIVE
RBC UA: NEGATIVE

## 2015-02-22 NOTE — Progress Notes (Signed)
Fetal Surveillance Testing today:  doppler   High Risk Pregnancy Diagnosis(es):   AMA  G3P1001 [redacted]w[redacted]d Estimated Date of Delivery: 08/06/15  Blood pressure 110/70, pulse 75, weight 186 lb (84.369 kg), last menstrual period 10/30/2014, unknown if currently breastfeeding.  Urinalysis: Negative   HPI: The patient is being seen today for ongoing management of pregnancy, AMA. Today she reports some round ligament pain   BP weight and urine results all reviewed and noted. Patient reports good fetal movement, denies any bleeding and no rupture of membranes symptoms or regular contractions.  Fundal Height:  16 Fetal Heart rate:  150 Edema:  no  Patient is without complaints other than noted in her HPI. All questions were answered.  All lab and sonogram results have been reviewed. Comments:    Assessment:  1.  Pregnancy at [redacted]w[redacted]d,  Estimated Date of Delivery: 08/06/15 :                          2.  AMA                        3.    Medication(s) Plans:  none  Treatment Plan:  2nd IT today.    Return in about 3 weeks (around 03/15/2015) for LROB, [id number]. for appointment for high risk OB care  No orders of the defined types were placed in this encounter.   Orders Placed This Encounter  Procedures  . Maternal Screen, Integrated #2  . POCT urinalysis dipstick

## 2015-02-22 NOTE — Progress Notes (Signed)
Pt states that she has had round ligament pain.

## 2015-02-22 NOTE — Patient Instructions (Signed)

## 2015-02-24 LAB — MATERNAL SCREEN, INTEGRATED #2
ADSF: 0.99
AFP MoM: 0.92
Alpha-Fetoprotein: 24.8 ng/mL
CROWN RUMP LENGTH: 57.3 mm
DIA MOM: 1.1
DIA VALUE: 167.3 pg/mL
ESTRIOL UNCONJUGATED: 0.84 ng/mL
Gest. Age on Collection Date: 12.3 weeks
Gestational Age: 16.3 weeks
HCG MOM: 1.05
Maternal Age at EDD: 41 years
NUCHAL TRANSLUCENCY (NT): 1.1 mm
NUCHAL TRANSLUCENCY MOM: 0.74
NUMBER OF FETUSES: 1
PAPP-A MoM: 1.67
PAPP-A Value: 1166.5 ng/mL
TEST RESULTS: NEGATIVE
Weight: 184 [lb_av]
Weight: 186 [lb_av]
hCG Value: 30.6 IU/mL

## 2015-03-12 ENCOUNTER — Ambulatory Visit (INDEPENDENT_AMBULATORY_CARE_PROVIDER_SITE_OTHER): Payer: BLUE CROSS/BLUE SHIELD

## 2015-03-12 ENCOUNTER — Ambulatory Visit (INDEPENDENT_AMBULATORY_CARE_PROVIDER_SITE_OTHER): Payer: BLUE CROSS/BLUE SHIELD | Admitting: Women's Health

## 2015-03-12 ENCOUNTER — Encounter: Payer: Self-pay | Admitting: Women's Health

## 2015-03-12 VITALS — BP 116/60 | HR 76 | Wt 190.0 lb

## 2015-03-12 DIAGNOSIS — O09522 Supervision of elderly multigravida, second trimester: Secondary | ICD-10-CM

## 2015-03-12 DIAGNOSIS — O34219 Maternal care for unspecified type scar from previous cesarean delivery: Secondary | ICD-10-CM

## 2015-03-12 DIAGNOSIS — O0991 Supervision of high risk pregnancy, unspecified, first trimester: Secondary | ICD-10-CM

## 2015-03-12 DIAGNOSIS — O0992 Supervision of high risk pregnancy, unspecified, second trimester: Secondary | ICD-10-CM

## 2015-03-12 DIAGNOSIS — N83202 Unspecified ovarian cyst, left side: Secondary | ICD-10-CM | POA: Insufficient documentation

## 2015-03-12 DIAGNOSIS — Z331 Pregnant state, incidental: Secondary | ICD-10-CM

## 2015-03-12 DIAGNOSIS — Z1389 Encounter for screening for other disorder: Secondary | ICD-10-CM

## 2015-03-12 DIAGNOSIS — Z363 Encounter for antenatal screening for malformations: Secondary | ICD-10-CM

## 2015-03-12 DIAGNOSIS — Z36 Encounter for antenatal screening of mother: Secondary | ICD-10-CM | POA: Diagnosis not present

## 2015-03-12 LAB — POCT URINALYSIS DIPSTICK
Blood, UA: NEGATIVE
GLUCOSE UA: NEGATIVE
KETONES UA: NEGATIVE
LEUKOCYTES UA: NEGATIVE
NITRITE UA: NEGATIVE
Protein, UA: NEGATIVE

## 2015-03-12 NOTE — Progress Notes (Signed)
High Risk Pregnancy Diagnosis(es): AMA G3P1001 [redacted]w[redacted]d Estimated Date of Delivery: 08/06/15 BP 116/60 mmHg  Pulse 76  Wt 190 lb (86.183 kg)  LMP 10/30/2014  Urinalysis: Negative HPI:  Doing well. Has a 3d business trip in [place] in January BP, weight, and urine reviewed.  Reports good fm. Denies regular uc's, lof, vb, uti s/s. No complaints.  Fundal Height:  19wks Fetal Heart rate:  159 u/s Edema:  none  Reviewed today's normal anatomy u/s- limited views of spine, will recheck at next u/s. Lt ovarian cyst- stable in size. Discussed ptl s/s, fm All questions were answered Assessment: [redacted]w[redacted]d AMA Medication(s) Plans:  none Treatment Plan:  U/S @ 24, 28, 32, 36wks, 2x/wk testing @ 36wks Follow up in 4wks for high-risk OB appt and growth u/s-recheck spine

## 2015-03-12 NOTE — Patient Instructions (Signed)

## 2015-03-12 NOTE — Progress Notes (Signed)
[place] 19wks,breech,cx 4.1cm,ant pl gr 0,normal rt ov,simple cyst lt ov 4.1 x 5.7 x 3 cm,svp of fluid 5.9cm,limited view of spine,please have pt come back for additional images,efw 253 g,fhr 159 bpm

## 2015-04-05 ENCOUNTER — Telehealth: Payer: Self-pay | Admitting: Adult Health

## 2015-04-05 NOTE — Telephone Encounter (Signed)
Spoke with pt. Pt has a cough and was wondering what she could take. I advised Robitussin and cough drops. Pt asked about Robitussin DM and I advised that was fine. Run a cool mist humidifier in bedroom when sleeping. Pt voiced understanding. Pt has an appt Monday for routine visit. [place]

## 2015-04-06 ENCOUNTER — Other Ambulatory Visit: Payer: Self-pay | Admitting: Women's Health

## 2015-04-06 DIAGNOSIS — Z0489 Encounter for examination and observation for other specified reasons: Secondary | ICD-10-CM

## 2015-04-06 DIAGNOSIS — IMO0002 Reserved for concepts with insufficient information to code with codable children: Secondary | ICD-10-CM

## 2015-04-10 ENCOUNTER — Other Ambulatory Visit: Payer: BLUE CROSS/BLUE SHIELD

## 2015-04-10 ENCOUNTER — Ambulatory Visit (INDEPENDENT_AMBULATORY_CARE_PROVIDER_SITE_OTHER): Payer: BLUE CROSS/BLUE SHIELD

## 2015-04-10 ENCOUNTER — Ambulatory Visit (INDEPENDENT_AMBULATORY_CARE_PROVIDER_SITE_OTHER): Payer: BLUE CROSS/BLUE SHIELD | Admitting: Obstetrics & Gynecology

## 2015-04-10 ENCOUNTER — Encounter: Payer: Self-pay | Admitting: Obstetrics & Gynecology

## 2015-04-10 ENCOUNTER — Encounter: Payer: BLUE CROSS/BLUE SHIELD | Admitting: Obstetrics & Gynecology

## 2015-04-10 VITALS — BP 120/80 | HR 80 | Wt 194.0 lb

## 2015-04-10 DIAGNOSIS — Z3A23 23 weeks gestation of pregnancy: Secondary | ICD-10-CM

## 2015-04-10 DIAGNOSIS — O34219 Maternal care for unspecified type scar from previous cesarean delivery: Secondary | ICD-10-CM

## 2015-04-10 DIAGNOSIS — O3481 Maternal care for other abnormalities of pelvic organs, first trimester: Secondary | ICD-10-CM

## 2015-04-10 DIAGNOSIS — Z1389 Encounter for screening for other disorder: Secondary | ICD-10-CM

## 2015-04-10 DIAGNOSIS — Z36 Encounter for antenatal screening of mother: Secondary | ICD-10-CM

## 2015-04-10 DIAGNOSIS — O0992 Supervision of high risk pregnancy, unspecified, second trimester: Secondary | ICD-10-CM | POA: Diagnosis not present

## 2015-04-10 DIAGNOSIS — IMO0002 Reserved for concepts with insufficient information to code with codable children: Secondary | ICD-10-CM

## 2015-04-10 DIAGNOSIS — Z331 Pregnant state, incidental: Secondary | ICD-10-CM

## 2015-04-10 DIAGNOSIS — O09522 Supervision of elderly multigravida, second trimester: Secondary | ICD-10-CM | POA: Diagnosis not present

## 2015-04-10 DIAGNOSIS — Z0489 Encounter for examination and observation for other specified reasons: Secondary | ICD-10-CM

## 2015-04-10 LAB — POCT URINALYSIS DIPSTICK
Blood, UA: NEGATIVE
GLUCOSE UA: NEGATIVE
Ketones, UA: NEGATIVE
LEUKOCYTES UA: NEGATIVE
NITRITE UA: NEGATIVE
Protein, UA: NEGATIVE

## 2015-04-10 NOTE — Progress Notes (Signed)
G3P1001 [redacted]w[redacted]d Estimated Date of Delivery: 08/06/15  Blood pressure 120/80, pulse 80, weight 194 lb (87.998 kg), last menstrual period 10/30/2014, unknown if currently breastfeeding.   BP weight and urine results all reviewed and noted.  Please refer to the obstetrical flow sheet for the fundal height and fetal heart rate documentation:  Patient reports good fetal movement, denies any bleeding and no rupture of membranes symptoms or regular contractions. Patient is without complaints. All questions were answered.  Orders Placed This Encounter  Procedures  . POCT urinalysis dipstick    Plan:  Continued routine obstetrical care, sonogram is normal spine seen  Return in about 4 weeks (around 05/08/2015) for PN2, , LROB.

## 2015-04-10 NOTE — Progress Notes (Signed)
[place] 23+1wks,measurement c/w dates,normal rt ov,simple lt cl cyst 4.8 x 3.6 x 3.4cm,cx 3.7,fhr 145 bpm,svp 6.8 cm,efw 581g 45%,anatomy of the spine complete,no obvious abn seen

## 2015-04-18 ENCOUNTER — Encounter: Payer: BLUE CROSS/BLUE SHIELD | Admitting: Women's Health

## 2015-04-18 ENCOUNTER — Other Ambulatory Visit: Payer: BLUE CROSS/BLUE SHIELD

## 2015-04-27 ENCOUNTER — Encounter (HOSPITAL_COMMUNITY): Payer: Self-pay | Admitting: Anesthesiology

## 2015-04-27 ENCOUNTER — Encounter (HOSPITAL_COMMUNITY): Payer: Self-pay | Admitting: *Deleted

## 2015-04-27 ENCOUNTER — Inpatient Hospital Stay (HOSPITAL_COMMUNITY)
Admission: AD | Admit: 2015-04-27 | Discharge: 2015-04-28 | Disposition: A | Discharge status: Home / Self Care | Payer: Managed Care, Other (non HMO) | Source: Ambulatory Visit | Attending: Emergency Medicine | Admitting: Emergency Medicine

## 2015-04-27 DIAGNOSIS — R109 Unspecified abdominal pain: Secondary | ICD-10-CM

## 2015-04-27 DIAGNOSIS — O99612 Diseases of the digestive system complicating pregnancy, second trimester: Secondary | ICD-10-CM | POA: Diagnosis not present

## 2015-04-27 DIAGNOSIS — Z8632 Personal history of gestational diabetes: Secondary | ICD-10-CM | POA: Insufficient documentation

## 2015-04-27 DIAGNOSIS — O99512 Diseases of the respiratory system complicating pregnancy, second trimester: Secondary | ICD-10-CM | POA: Insufficient documentation

## 2015-04-27 DIAGNOSIS — Z91040 Latex allergy status: Secondary | ICD-10-CM | POA: Insufficient documentation

## 2015-04-27 DIAGNOSIS — O9989 Other specified diseases and conditions complicating pregnancy, childbirth and the puerperium: Secondary | ICD-10-CM | POA: Diagnosis not present

## 2015-04-27 DIAGNOSIS — Z862 Personal history of diseases of the blood and blood-forming organs and certain disorders involving the immune mechanism: Secondary | ICD-10-CM | POA: Diagnosis not present

## 2015-04-27 DIAGNOSIS — Z3A25 25 weeks gestation of pregnancy: Secondary | ICD-10-CM | POA: Insufficient documentation

## 2015-04-27 DIAGNOSIS — Z8719 Personal history of other diseases of the digestive system: Secondary | ICD-10-CM

## 2015-04-27 DIAGNOSIS — Z8742 Personal history of other diseases of the female genital tract: Secondary | ICD-10-CM | POA: Diagnosis not present

## 2015-04-27 DIAGNOSIS — Z8669 Personal history of other diseases of the nervous system and sense organs: Secondary | ICD-10-CM | POA: Insufficient documentation

## 2015-04-27 DIAGNOSIS — Z79899 Other long term (current) drug therapy: Secondary | ICD-10-CM | POA: Insufficient documentation

## 2015-04-27 DIAGNOSIS — Z880 Allergy status to penicillin: Secondary | ICD-10-CM | POA: Insufficient documentation

## 2015-04-27 DIAGNOSIS — O26899 Other specified pregnancy related conditions, unspecified trimester: Secondary | ICD-10-CM

## 2015-04-27 DIAGNOSIS — J45909 Unspecified asthma, uncomplicated: Secondary | ICD-10-CM | POA: Insufficient documentation

## 2015-04-27 DIAGNOSIS — K8050 Calculus of bile duct without cholangitis or cholecystitis without obstruction: Secondary | ICD-10-CM | POA: Diagnosis not present

## 2015-04-27 DIAGNOSIS — O0992 Supervision of high risk pregnancy, unspecified, second trimester: Secondary | ICD-10-CM

## 2015-04-27 LAB — URINALYSIS, ROUTINE W REFLEX MICROSCOPIC
Bilirubin Urine: NEGATIVE
GLUCOSE, UA: NEGATIVE mg/dL
HGB URINE DIPSTICK: NEGATIVE
LEUKOCYTES UA: NEGATIVE
Nitrite: NEGATIVE
PROTEIN: NEGATIVE mg/dL
Specific Gravity, Urine: 1.025 (ref 1.005–1.030)
pH: 5.5 (ref 5.0–8.0)

## 2015-04-27 MED ORDER — HYDROMORPHONE HCL 1 MG/ML IJ SOLN
0.5000 mg | Freq: Once | INTRAMUSCULAR | Status: AC
  Administered 2015-04-27: 0.5 mg via INTRAVENOUS
  Filled 2015-04-27: qty 1

## 2015-04-27 MED ORDER — SODIUM CHLORIDE 0.9 % IV SOLN
INTRAVENOUS | Status: DC
  Administered 2015-04-27: 22:00:00 via INTRAVENOUS

## 2015-04-27 NOTE — ED Provider Notes (Signed)
CSN: [id number]     Arrival date & time 04/27/15  2040 History  By signing my name below, I, [name], attest that this documentation has been prepared under the direction and in the presence of Orpah Greek, MD. Electronically Signed: [name], ED Scribe. 04/27/2015. 11:44 PM.    Chief Complaint  Patient presents with  . Abdominal Pain   The history is provided by the patient. No language interpreter was used.   HPI Comments: Kristen Dunn is a 40 y.o. [redacted] week pregnant female with hx bowel obstruction and as noted below who presents to the Emergency Department complaining of generalized abdominal cramping onset this morning after eating breakfast.  She denies any flatulence or bowel movements since and reports this episodes feels like her two prior bowel obstructions.  She denies vomiting. Past Medical History  Diagnosis Date  . Asthma   . Anemia   . AMA (advanced maternal age) multigravida 44+   . Gestational diabetes     glyburide  . PONV (postoperative nausea and vomiting)   . Hx of migraines 10/26/2012  . Other and unspecified ovarian cyst 03/30/2013   Past Surgical History  Procedure Laterality Date  . Gastric bypass 2008    . Tonsillectomy    . Cesarean section  01/13/2012    Procedure: CESAREAN SECTION;  Surgeon: Mora Bellman, MD;  Location: [facility];  Service: Obstetrics;  Laterality: N/A;  . Incisional hernia repair  03/31/2012    Procedure: LAPAROSCOPIC INCISIONAL HERNIA;  Surgeon: Edward Jolly, MD;  Location: [facility];  Service: General;  Laterality: N/A;  LAPAROSCOPY REPAIR INTERNAL HERNIA  petersons defect  . Laparoscopic gastric banding with hiatal hernia repair N/A 03/14/2013    Procedure: DIAGNOSTIC LAPAROSCOPY  WITH  INTERNAL HERNIA  REDUCTION WITH CLOSURE;  Surgeon: Shann Medal, MD;  Location: [facility];  Service: General;  Laterality: N/A;   Family History  Problem Relation Age of Onset  . COPD Mother   . Asthma Mother   . Hypertension  Father   . Aneurysm Father   . Asthma Sister   . Thyroid disease Maternal Grandmother   . Stroke Maternal Grandmother   . Von Willebrand disease Cousin   . Cancer Maternal Aunt     breast   Social History  Substance Use Topics  . Smoking status: Never Smoker   . Smokeless tobacco: Never Used  . Alcohol Use: No     Comment: rare; not now   OB History    Gravida Para Term Preterm AB TAB SAB Ectopic Multiple Living   3 1 1  0 0 0 0 0 0 1     Review of Systems 10 Systems reviewed and all are negative for acute change except as noted in the HPI.   Allergies  Penicillins; Aspirin; Alupent; Lactose intolerance (gi); and Latex  Home Medications   Prior to Admission medications   Medication Sig Start Date End Date Taking? Authorizing Provider  acetaminophen (TYLENOL) 325 MG tablet Take 650 mg by mouth every 6 (six) hours as needed.   Yes Historical Provider, MD  albuterol (PROVENTIL HFA;VENTOLIN HFA) 108 (90 BASE) MCG/ACT inhaler Inhale 1 puff into the lungs every 6 (six) hours as needed. Patient states she very seldom has to use, but she does keep one on hand   Yes Historical Provider, MD  Prenatal Vit-Fe Fumarate-FA (PRENATAL VITAMIN PO) Take by mouth daily.   Yes Historical Provider, MD  oxyCODONE-acetaminophen (PERCOCET) 5-325 MG tablet Take 1-2 tablets  by mouth every 4 (four) hours as needed. 04/28/15   Orpah Greek, MD   BP 94/69 mmHg  Pulse 98  Temp(Src) 98.3 F (36.8 C) (Oral)  Resp 16  Ht 5\' 5"  (1.651 m)  Wt 194 lb 2 oz (88.055 kg)  BMI 32.30 kg/m2  SpO2 98%  LMP 10/30/2014 Physical Exam  Constitutional: She is oriented to person, place, and time. She appears well-developed and well-nourished. No distress.  HENT:  Head: Normocephalic and atraumatic.  Right Ear: Hearing normal.  Left Ear: Hearing normal.  Nose: Nose normal.  Mouth/Throat: Oropharynx is clear and moist and mucous membranes are normal.  Eyes: Conjunctivae and EOM are normal. Pupils are equal,  round, and reactive to light.  Neck: Normal range of motion. Neck supple.  Cardiovascular: Regular rhythm, S1 normal and S2 normal.  Exam reveals no gallop and no friction rub.   No murmur heard. Pulmonary/Chest: Effort normal and breath sounds normal. No respiratory distress. She exhibits no tenderness.  Abdominal: Soft. Normal appearance and bowel sounds are normal. There is no hepatosplenomegaly. There is no rebound, no guarding, no tenderness at McBurney's point and negative Murphy's sign. No hernia.  Diffusely distended.  Diffusely tender.  Normal bowel sounds.   Musculoskeletal: Normal range of motion.  Neurological: She is alert and oriented to person, place, and time. She has normal strength. No cranial nerve deficit or sensory deficit. Coordination normal. GCS eye subscore is 4. GCS verbal subscore is 5. GCS motor subscore is 6.  Skin: Skin is warm, dry and intact. No rash noted. No cyanosis.  Psychiatric: She has a normal mood and affect. Her speech is normal and behavior is normal. Thought content normal.  Nursing note and vitals reviewed.   ED Course  Procedures (including critical care time)  DIAGNOSTIC STUDIES: Oxygen Saturation is 100% on RA, normal by my interpretation.    COORDINATION OF CARE:  11:45 PM Discussed treatment plan with patient at bedside which include labs and abdominal imaging.  Patient acknowledges and agrees with plan.    Labs Review Labs Reviewed  URINALYSIS, ROUTINE W REFLEX MICROSCOPIC (NOT AT [facility]) - Abnormal; Notable for the following:    Ketones, ur >80 (*)    All other components within normal limits  CBC WITH DIFFERENTIAL/PLATELET - Abnormal; Notable for the following:    WBC 10.6 (*)    RBC 3.36 (*)    Hemoglobin 11.0 (*)    HCT 33.0 (*)    Neutro Abs 8.4 (*)    All other components within normal limits  COMPREHENSIVE METABOLIC PANEL - Abnormal; Notable for the following:    Calcium 8.2 (*)    Total Protein 5.8 (*)    Albumin 2.9 (*)     ALT 9 (*)    All other components within normal limits  I-STAT CHEM 8, ED - Abnormal; Notable for the following:    BUN 4 (*)    Creatinine, Ser 0.40 (*)    Hemoglobin 11.6 (*)    HCT 34.0 (*)    All other components within normal limits  LIPASE, BLOOD  I-STAT CG4 LACTIC ACID, ED    Imaging Review Ct Abdomen Pelvis W Contrast  04/28/2015  CLINICAL DATA:  Acute onset of generalized abdominal cramping. Initial encounter. EXAM: CT ABDOMEN AND PELVIS WITH CONTRAST TECHNIQUE: Multidetector CT imaging of the abdomen and pelvis was performed using the standard protocol following bolus administration of intravenous contrast. CONTRAST:  OMNIPAQUE IOHEXOL 300 MG/ML  SOLN COMPARISON:  CT of  the abdomen and pelvis from 04/05/2014, and pelvic ultrasound performed 04/10/2015 FINDINGS: The visualized lung bases are clear. The patient is status post gastric bypass. The gastrojejunal anastomosis is unremarkable. A small amount of fluid is noted within the distal stomach. There is slight distention of the common bile duct to 7 mm, without significant intrahepatic biliary ductal dilatation. There is question of a tiny 3 mm stone at the distal common bile duct, at the level of the pancreatic head. The liver and spleen are unremarkable in appearance. The gallbladder is within normal limits. The pancreas and adrenal glands are unremarkable. Mild scarring is noted at the left kidney. There is no evidence of hydronephrosis. No renal or ureteral stones are seen. No perinephric stranding is appreciated. No free fluid is identified. The small bowel is unremarkable in appearance. The stomach is within normal limits. No acute vascular abnormalities are seen. The appendix is normal in caliber and contains air, without evidence of appendicitis. The colon is grossly unremarkable in appearance. The bladder is mildly distended and grossly unremarkable. The uterus is unremarkable in appearance, with an anterior placenta. There is no  evidence for placenta previa. The ovaries are difficult to fully characterize, with a likely 4.2 cm left adnexal cyst. No suspicious adnexal masses are seen. No inguinal lymphadenopathy is seen. No acute osseous abnormalities are identified. IMPRESSION: 1. Slight distention of the common bile duct to 7 mm, without significant intrahepatic biliary ductal dilatation. Question of a tiny 3 mm stone at the distal common bile duct, at the level of the pancreatic head, raising concern for mild obstruction. The gallbladder remains unremarkable in appearance. Would correlate with the patient's lab values and clinical findings. 2. Pregnancy unremarkable in appearance at this time. Ovaries difficult to fully characterize, though a 4.2 cm left adnexal cyst is likely physiologic in nature. 3. Mild scarring at the left kidney. Electronically Signed   By: Garald Balding M.D.   On: 04/28/2015 01:57   I have personally reviewed and evaluated these images and lab results as part of my medical decision-making.   EKG Interpretation None      MDM   Final diagnoses:  Abdominal pain complicating pregnancy  H/O small bowel obstruction  Choledocholithiasis    Patient referred to this emergency department from [facility] hospital. Patient is currently [redacted] weeks pregnant. She has a history of Roux-en-Y gastric bypass surgery in 2008. Patient had internal hernia in 2013 repaired by Dr. Excell Seltzer. She had recurrent internal hernia in 2014 that was surgically repaired as well. Patient presents with onset of severe pain with abdominal distention, no bowel movement, no passage of gas. She reports that these symptoms are identical to the pain and symptoms she had with her previous internal hernias. When she was evaluated at [facility], obstetric causes were ruled out and she was referred to the ER for further evaluation.  Brief history of recurrent internal hernia, this was considered a possibility for current etiology. Patient  also reports that her symptoms felt identical. This is a surgical emergency. MRI would be the modality of choice for evaluation in a pregnant women, but MRI is not available at this time of night at this ER. There was consideration for possible transfer to [facility] for MRI. I discussed this with Dr. gross, general surgery. He felt that the patient needed immediate imaging to rule out this entity, as she would need immediate life-saving surgery if present. I also discussed this briefly with Dr. Glo Herring, OB/GYN. It was ultimately felt  that the patient required CT scan, despite the risks of radiation to the fetus. I did have a lengthy conversation with the patient and her husband about this, and after the conversation, all agreed that the CAT scan was the best option.  CT scan was performed. No evidence of internal hernia seen. There is, however, slight distention of the common bile duct with a possible common bile duct stone.  I discussed this with Dr. Watt Climes, gastroenterology. Based on the patient's Roux-en-Y gastric bypass, he did not feel that the patient could likely undergo ERCP. He feels that the patient will require MRCP. This is not urgent as the patient is afebrile, normal vital signs, normal LFTs, normal lipase. Upon reevaluation, patient is comfortable, pain is significantly improving. Based on this, I feel she is appropriate for discharge and follow-up with her surgeon in the office Monday morning. She is counseled that she should go to [facility] emergency department if she has any worsening symptoms over the weekend.  I personally performed the services described in this documentation, which was scribed in my presence. The recorded information has been reviewed and is accurate.     Orpah Greek, MD 04/28/15 [phone]

## 2015-04-27 NOTE — ED Notes (Signed)
Pregnant pt (25 weeks) G3P1. Complaining of abdominal pain with a hx of gastric bypass and bowel obstructions. Denies nausea and vomiting. Endorses pain 6/10. Received dilaudid at [facility]. LBM 04/25/14.

## 2015-04-27 NOTE — ED Notes (Signed)
Bed: [id number] Expected date:  Expected time:  Means of arrival:  Comments: [place] tx ?SBO 25W preg

## 2015-04-27 NOTE — MAU Provider Note (Signed)
Chief Complaint:  Abdominal Pain   Provider initially saw patient at 2140 hrs on 04/27/15   HPI   Kristen Dunn is a 40 y.o. G3P1001 at 41w4dwho presents to maternity admissions reporting severe abdominal pain since 9am today. States had one loose stool yesterday and ate chilli last night. Thought the pain was from gas produced by the chili, so waited at home to see if it would pass. Was unable to pass gas all day. No BM today. States pain is generalized and feels very much like her two prior bowel obstructions.   Does not have vomiting at this time.  Had a RYGB in 2008 and lost .150 lbs.  Then in 2013, had a Repair of Peterson's internal hernia by Dr Excell Seltzer.  Had recurrent abdominal pain in 2014 and had an Internal Hernia at [facility] defect with trapped biliary limb of RYGB, necessitating a Closure of the defect by Dr Lucia Gaskins.   She reports good fetal movement, denies LOF, vaginal bleeding, vaginal itching/burning, urinary symptoms, h/a, dizziness, n/v, diarrhea, constipation or fever/chills.    RN Note: Generalized abdominal pain started this am around 9 and has worsened during the day. Unable to pass gas, last BM yesterday. Patient has history of gastric bypass 8 years ago and has had 2 bowel obstructions since bypass surgery, both requiring surgery. This pain is similar in nature as other two obstructions. Denies any vaginal or rectal bleeding, no LOF or discharge, reports good fetal movement.           Past Medical History: Past Medical History  Diagnosis Date  . Asthma   . Anemia   . AMA (advanced maternal age) multigravida 72+   . Gestational diabetes     glyburide  . PONV (postoperative nausea and vomiting)   . Hx of migraines 10/26/2012  . Other and unspecified ovarian cyst 03/30/2013    Past obstetric history: OB History  Gravida Para Term Preterm AB SAB TAB Ectopic Multiple Living  3 1 1  0 0 0 0 0 0 1    # Outcome Date GA Lbr Len/2nd Weight Sex Delivery Anes PTL Lv   3 Current           2 Term 01/13/12 [redacted]w[redacted]d  3.795 kg (8 lb 5.9 oz) M CS-LTranv EPI  Y  1 Gravida               Past Surgical History: Past Surgical History  Procedure Laterality Date  . Gastric bypass 2008    . Tonsillectomy    . Cesarean section  01/13/2012    Procedure: CESAREAN SECTION;  Surgeon: Mora Bellman, MD;  Location: [facility];  Service: Obstetrics;  Laterality: N/A;  . Incisional hernia repair  03/31/2012    Procedure: LAPAROSCOPIC INCISIONAL HERNIA;  Surgeon: Edward Jolly, MD;  Location: [facility];  Service: General;  Laterality: N/A;  LAPAROSCOPY REPAIR INTERNAL HERNIA  petersons defect  . Laparoscopic gastric banding with hiatal hernia repair N/A 03/14/2013    Procedure: DIAGNOSTIC LAPAROSCOPY  WITH  INTERNAL HERNIA  REDUCTION WITH CLOSURE;  Surgeon: Shann Medal, MD;  Location: [facility];  Service: General;  Laterality: N/A;    Family History: Family History  Problem Relation Age of Onset  . COPD Mother   . Asthma Mother   . Hypertension Father   . Aneurysm Father   . Asthma Sister   . Thyroid disease Maternal Grandmother   . Stroke Maternal Grandmother   . Von Willebrand disease Cousin   .  Cancer Maternal Aunt     breast    Social History: Social History  Substance Use Topics  . Smoking status: Never Smoker   . Smokeless tobacco: Never Used  . Alcohol Use: No     Comment: rare; not now    Allergies:  Allergies  Allergen Reactions  . Penicillins Anaphylaxis    Reaction occurred as a very small child; anaphylaxis likely but not completely certain Has patient had a PCN reaction causing immediate rash, facial/tongue/throat swelling, SOB or lightheadedness with hypotension: Yes Has patient had a PCN reaction causing severe rash involving mucus membranes or skin necrosis: No Has patient had a PCN reaction that required hospitalization No Has patient had a PCN reaction occurring within the last 10 years: No If all of the above answers are "NO", then may  procee  . Aspirin     Intolerance because of gastric bypass surgery Asthma flares up  . Alupent [Metaproterenol] Palpitations and Other (See Comments)    hyperadrenergic tremor  . Lactose Intolerance (Gi) Diarrhea  . Latex Itching and Dermatitis    Meds:  Prescriptions prior to admission  Medication Sig Dispense Refill Last Dose  . acetaminophen (TYLENOL) 325 MG tablet Take 650 mg by mouth every 6 (six) hours as needed.   04/27/2015 at Unknown time  . albuterol (PROVENTIL HFA;VENTOLIN HFA) 108 (90 BASE) MCG/ACT inhaler Inhale 1 puff into the lungs every 6 (six) hours as needed. Patient states she very seldom has to use, but she does keep one on hand   Past Month at Unknown time  . Prenatal Vit-Fe Fumarate-FA (PRENATAL VITAMIN PO) Take by mouth daily.   04/27/2015 at Unknown time    ROS:  Review of Systems  Constitutional: Negative for fever, chills and fatigue.  Respiratory: Negative for shortness of breath.   Gastrointestinal: Positive for abdominal pain, constipation and abdominal distention (mild). Negative for nausea, vomiting and diarrhea.  Genitourinary: Negative for dysuria, vaginal bleeding, vaginal discharge and pelvic pain.  Musculoskeletal: Negative for back pain.   I have reviewed patient's Past Medical Hx, Surgical Hx, Family Hx, Social Hx, medications and allergies.   Physical Exam  Patient Vitals for the past 24 hrs:  BP Temp Pulse Resp Height Weight  04/27/15 2102 126/80 mmHg 98.2 F (36.8 C) 81 18 5\' 5"  (1.651 m) 88.055 kg (194 lb 2 oz)   Constitutional: Well-developed, well-nourished female in mild to moderate distress due to pain  Cardiovascular: normal rate and rhythm Respiratory: normal effort, clear to auscultate bilaterally GI: Abd soft, tender throughout, slightly distended, gravid appropriate for gestational age.   No rebound, but does have mild guarding. MS: Extremities nontender, no edema, normal ROM Neurologic: Alert and oriented x 4.  GU: Neg CVAT.      FHT:  Baseline 145 , moderate variability, accelerations present, no decelerations Contractions: Rare   Labs: Results for orders placed or performed during the hospital encounter of 04/27/15 (from the past 24 hour(s))  Urinalysis, Routine w reflex microscopic (not at [facility])     Status: Abnormal   Collection Time: 04/27/15  8:50 PM  Result Value Ref Range   Color, Urine YELLOW YELLOW   APPearance CLEAR CLEAR   Specific Gravity, Urine 1.025 1.005 - 1.030   pH 5.5 5.0 - 8.0   Glucose, UA NEGATIVE NEGATIVE mg/dL   Hgb urine dipstick NEGATIVE NEGATIVE   Bilirubin Urine NEGATIVE NEGATIVE   Ketones, ur >80 (A) NEGATIVE mg/dL   Protein, ur NEGATIVE NEGATIVE mg/dL   Nitrite  NEGATIVE NEGATIVE   Leukocytes, UA NEGATIVE NEGATIVE   A/Positive/-- (09/12 1611)  Imaging:  US Ob Follow Up  04/10/2015  FOLLOW UP SONOGRAM LENAYA WHITEFIELD is in the office for a follow up sonogram to recheck spine not seen on previous ultrasound. She is a 40 y.o. year old G1P1001 with Estimated Date of Delivery: 08/06/15 by LMP now at  [redacted]w[redacted]d weeks gestation. Thus far the pregnancy has been complicated by gastric bypass,lt ov cyst,hx of gestational diabetes,ama.. GESTATION: SINGLETON PRESENTATION: breech FETAL ACTIVITY:          Heart rate         145 bpm          The fetus is active. AMNIOTIC FLUID: The amniotic fluid volume is  normal, 6.8 cm.svp PLACENTA LOCALIZATION:  anterior GRADE 0 CERVIX: Measures 3.7 cm ADNEXA: normal rt ov,simple lt cl cyst 4.8 x 3.6 x 3.4cm GESTATIONAL AGE AND  BIOMETRICS: Gestational criteria: Estimated Date of Delivery: 08/06/15 by LMP now at [redacted]w[redacted]d Previous Scans:2          BIPARIETAL DIAMETER           5.51 cm         22+6 weeks HEAD CIRCUMFERENCE           20.77 cm         22+6 weeks ABDOMINAL CIRCUMFERENCE           18.41 cm         23+2 weeks FEMUR LENGTH           4.06 cm         23+1 weeks                                                       AVERAGE EGA(BY THIS SCAN):  23 weeks                                                  ESTIMATED FETAL WEIGHT:       581  grams, 46 % ANATOMICAL SURVEY                                                                            COMMENTS CEREBRAL VENTRICLES yes normal  CHOROID PLEXUS yes normal  CEREBELLUM yes normal  CISTERNA MAGNA yes normal  NUCHAL REGION yes normal  ORBITS yes normal  NASAL BONE    NOSE/LIP yes normal  FACIAL PROFILE    4 CHAMBERED HEART yes normal  OUTFLOW TRACTS yes normal  DIAPHRAGM yes normal  STOMACH yes normal  RENAL REGION yes normal  BLADDER yes normal  CORD INSERTION yes normal  3 VESSEL CORD yes normal  SPINE yes normal  ARMS/HANDS    LEGS/FEET    GENITALIA   female     SUSPECTED ABNORMALITIES:  no QUALITY OF SCAN: satisfactory TECHNICIAN COMMENTS: [place] 23+1wks,measurement c/w dates,normal rt ov,simple lt  cl cyst 4.8 x 3.6 x 3.4cm,cx 3.7cm,fhr 145 bpm,svp 6.8 cm,efw 581g 45%,anatomy of the spine complete,no obvious abn seen A copy of this report including all images has been saved and backed up to a second source for retrieval if needed. All measures and details of the anatomical scan, placentation, fluid volume and pelvic anatomy are contained in that report. Amber Heide Guile 04/10/2015 4:19 PM Clinical Impression and recommendations: I have reviewed the sonogram results above, combined with the patient's current clinical course, below are my impressions and any appropriate recommendations for management based on the sonographic findings. 1.  G3P1001 Estimated Date of Delivery: 08/06/15 by  LMP, early ultrasound, midtrimester ultrasound and confirmed by today's sonographic dating 2.  Normal fetal sonographic findings, specifically normal detailed anatomical evaluation,      no abnormalities noted 3.  Normal general sonographic findings Recommend routine prenatal care based on this sonogram or as clinically indicated EURE,LUTHER H 04/10/2015 4:38 PM    MAU Course/MDM: I have ordered labs NST reviewed Consult Dr Glo Herring with presentation,  exam findings and test results.  Treatments in MAU included IV started    Dr Glo Herring called Dr Ayesha Rumpf at [facility] who accepts transfer  Assessment: 1. High-risk pregnancy supervision, second trimester     Plan: Transfer to [facility] for evaluation and probable MRI Consult Gen Surgery as appropriate    Medication List    ASK your doctor about these medications        acetaminophen 325 MG tablet  Commonly known as:  TYLENOL  Take 650 mg by mouth every 6 (six) hours as needed.     albuterol 108 (90 Base) MCG/ACT inhaler  Commonly known as:  PROVENTIL HFA;VENTOLIN HFA  Inhale 1 puff into the lungs every 6 (six) hours as needed. Patient states she very seldom has to use, but she does keep one on hand     PRENATAL VITAMIN PO  Take by mouth daily.        Hansel Feinstein CNM, MSN Certified Nurse-Midwife 04/27/2015 9:55 PM

## 2015-04-27 NOTE — MAU Note (Signed)
Generalized abdominal pain started this am around 9 and has worsened during the day. Unable to pass gas, last BM yesterday. Patient has history of gastric bypass 8 years ago and has had 2 bowel obstructions since bypass surgery, both requiring surgery.  This pain is similar in nature as other two obstructions.  Denies any vaginal or rectal bleeding, no LOF or discharge, reports good fetal movement.

## 2015-04-28 ENCOUNTER — Inpatient Hospital Stay (HOSPITAL_COMMUNITY): Payer: Managed Care, Other (non HMO)

## 2015-04-28 LAB — CBC WITH DIFFERENTIAL/PLATELET
BASOS ABS: 0 10*3/uL (ref 0.0–0.1)
BASOS PCT: 0 %
EOS PCT: 1 %
Eosinophils Absolute: 0.1 10*3/uL (ref 0.0–0.7)
HEMATOCRIT: 33 % — AB (ref 36.0–46.0)
Hemoglobin: 11 g/dL — ABNORMAL LOW (ref 12.0–15.0)
Lymphocytes Relative: 14 %
Lymphs Abs: 1.5 10*3/uL (ref 0.7–4.0)
MCH: 32.7 pg (ref 26.0–34.0)
MCHC: 33.3 g/dL (ref 30.0–36.0)
MCV: 98.2 fL (ref 78.0–100.0)
MONO ABS: 0.6 10*3/uL (ref 0.1–1.0)
MONOS PCT: 6 %
NEUTROS ABS: 8.4 10*3/uL — AB (ref 1.7–7.7)
Neutrophils Relative %: 79 %
PLATELETS: 260 10*3/uL (ref 150–400)
RBC: 3.36 MIL/uL — ABNORMAL LOW (ref 3.87–5.11)
RDW: 12.4 % (ref 11.5–15.5)
WBC: 10.6 10*3/uL — ABNORMAL HIGH (ref 4.0–10.5)

## 2015-04-28 LAB — COMPREHENSIVE METABOLIC PANEL
ALBUMIN: 2.9 g/dL — AB (ref 3.5–5.0)
ALT: 9 U/L — ABNORMAL LOW (ref 14–54)
ANION GAP: 7 (ref 5–15)
AST: 15 U/L (ref 15–41)
Alkaline Phosphatase: 57 U/L (ref 38–126)
BILIRUBIN TOTAL: 1.2 mg/dL (ref 0.3–1.2)
BUN: 7 mg/dL (ref 6–20)
CHLORIDE: 109 mmol/L (ref 101–111)
CO2: 22 mmol/L (ref 22–32)
Calcium: 8.2 mg/dL — ABNORMAL LOW (ref 8.9–10.3)
Creatinine, Ser: 0.47 mg/dL (ref 0.44–1.00)
GFR calc Af Amer: >60 mL/min (ref >60)
Glucose, Bld: 98 mg/dL (ref 65–99)
POTASSIUM: 3.7 mmol/L (ref 3.5–5.1)
Sodium: 138 mmol/L (ref 135–145)
TOTAL PROTEIN: 5.8 g/dL — AB (ref 6.5–8.1)

## 2015-04-28 LAB — I-STAT CHEM 8, ED
BUN: 4 mg/dL — ABNORMAL LOW (ref 6–20)
Calcium, Ion: 1.13 mmol/L (ref 1.12–1.23)
Chloride: 106 mmol/L (ref 101–111)
Creatinine, Ser: 0.4 mg/dL — ABNORMAL LOW (ref 0.44–1.00)
Glucose, Bld: 91 mg/dL (ref 65–99)
HEMATOCRIT: 34 % — AB (ref 36.0–46.0)
HEMOGLOBIN: 11.6 g/dL — AB (ref 12.0–15.0)
POTASSIUM: 3.6 mmol/L (ref 3.5–5.1)
SODIUM: 138 mmol/L (ref 135–145)
TCO2: 19 mmol/L (ref 0–100)

## 2015-04-28 LAB — I-STAT CG4 LACTIC ACID, ED: LACTIC ACID, VENOUS: 0.54 mmol/L (ref 0.5–2.0)

## 2015-04-28 LAB — LIPASE, BLOOD: Lipase: 21 U/L (ref 11–51)

## 2015-04-28 MED ORDER — HYDROMORPHONE HCL 1 MG/ML IJ SOLN
1.0000 mg | Freq: Once | INTRAMUSCULAR | Status: AC
  Administered 2015-04-28: 1 mg via INTRAVENOUS
  Filled 2015-04-28: qty 1

## 2015-04-28 MED ORDER — IOHEXOL 300 MG/ML  SOLN
50.0000 mL | Freq: Once | INTRAMUSCULAR | Status: AC | PRN
  Administered 2015-04-28: 50 mL via ORAL

## 2015-04-28 MED ORDER — OXYCODONE-ACETAMINOPHEN 5-325 MG PO TABS: 1.0000 | ORAL_TABLET | ORAL | Status: DC | PRN

## 2015-04-28 MED ORDER — IOHEXOL 300 MG/ML  SOLN
100.0000 mL | Freq: Once | INTRAMUSCULAR | Status: AC | PRN
  Administered 2015-04-28: 100 mL via INTRAVENOUS

## 2015-04-28 NOTE — ED Notes (Signed)
Patient transported to CT 

## 2015-04-28 NOTE — Discharge Instructions (Signed)

## 2015-05-08 ENCOUNTER — Encounter: Payer: Self-pay | Admitting: Women's Health

## 2015-05-08 ENCOUNTER — Ambulatory Visit (INDEPENDENT_AMBULATORY_CARE_PROVIDER_SITE_OTHER): Payer: Managed Care, Other (non HMO) | Admitting: Women's Health

## 2015-05-08 ENCOUNTER — Other Ambulatory Visit: Payer: Managed Care, Other (non HMO)

## 2015-05-08 VITALS — BP 100/70 | HR 106 | Wt 195.5 lb

## 2015-05-08 DIAGNOSIS — Z131 Encounter for screening for diabetes mellitus: Secondary | ICD-10-CM

## 2015-05-08 DIAGNOSIS — Z1389 Encounter for screening for other disorder: Secondary | ICD-10-CM

## 2015-05-08 DIAGNOSIS — O0992 Supervision of high risk pregnancy, unspecified, second trimester: Secondary | ICD-10-CM

## 2015-05-08 DIAGNOSIS — Z331 Pregnant state, incidental: Secondary | ICD-10-CM

## 2015-05-08 DIAGNOSIS — O09522 Supervision of elderly multigravida, second trimester: Secondary | ICD-10-CM

## 2015-05-08 DIAGNOSIS — Z3A27 27 weeks gestation of pregnancy: Secondary | ICD-10-CM

## 2015-05-08 DIAGNOSIS — Z369 Encounter for antenatal screening, unspecified: Secondary | ICD-10-CM

## 2015-05-08 LAB — POCT URINALYSIS DIPSTICK
Blood, UA: NEGATIVE
Glucose, UA: NEGATIVE
KETONES UA: NEGATIVE
LEUKOCYTES UA: NEGATIVE
NITRITE UA: NEGATIVE
PROTEIN UA: NEGATIVE

## 2015-05-08 NOTE — Patient Instructions (Signed)

## 2015-05-08 NOTE — Progress Notes (Signed)
Pt denies any problems or concerns at this time.  

## 2015-05-08 NOTE — Progress Notes (Signed)
High Risk Pregnancy Diagnosis(es): AMA G3P1001 [redacted]w[redacted]d Estimated Date of Delivery: 08/06/15 BP 100/70 mmHg  Pulse 106  Wt 195 lb 8 oz (88.678 kg)  LMP 10/30/2014  Urinalysis: Negative HPI:  Doing well, went to [facility] last week for gallbladder pain, was transferred to [facility] b/c they thought there was a blockage- but there was not. She sees Dr. Excell Seltzer on Tues for consult. To continue low-fat diet. States she's feeling much better now.  BP, weight, and urine reviewed.  Reports good fm. Denies regular uc's, lof, vb, uti s/s. No complaints.  Fundal Height:  27 Fetal Heart rate:  149 Edema:  none  Reviewed ptl s/s, fkc. Recommended Tdap at HD/PCP per CDC guidelines.  All questions were answered Assessment: [redacted]w[redacted]d AMA Medication(s) Plans:  n/a Treatment Plan:  Needs growth u/s @ 28wks, 32, 36, begin 2x/wk testing at 36wks Follow up in 1wk for high-risk OB appt and efw/afi u/s for AMA PN2 today

## 2015-05-09 LAB — CBC
Hematocrit: 35.2 % (ref 34.0–46.6)
Hemoglobin: 11.7 g/dL (ref 11.1–15.9)
MCH: 31.4 pg (ref 26.6–33.0)
MCHC: 33.2 g/dL (ref 31.5–35.7)
MCV: 94 fL (ref 79–97)
PLATELETS: 328 10*3/uL (ref 150–379)
RBC: 3.73 x10E6/uL — AB (ref 3.77–5.28)
RDW: 12.6 % (ref 12.3–15.4)
WBC: 8 10*3/uL (ref 3.4–10.8)

## 2015-05-09 LAB — GLUCOSE TOLERANCE, 2 HOURS W/ 1HR
Glucose, 1 hour: 87 mg/dL (ref 65–179)
Glucose, 2 hour: 41 mg/dL — ABNORMAL LOW (ref 65–152)
Glucose, Fasting: 83 mg/dL (ref 65–91)

## 2015-05-09 LAB — ANTIBODY SCREEN: Antibody Screen: NEGATIVE

## 2015-05-09 LAB — HIV ANTIBODY (ROUTINE TESTING W REFLEX): HIV SCREEN 4TH GENERATION: NONREACTIVE

## 2015-05-09 LAB — RPR: RPR: NONREACTIVE

## 2015-05-15 ENCOUNTER — Other Ambulatory Visit: Payer: Self-pay | Admitting: Women's Health

## 2015-05-15 DIAGNOSIS — O09522 Supervision of elderly multigravida, second trimester: Secondary | ICD-10-CM

## 2015-05-16 ENCOUNTER — Ambulatory Visit (INDEPENDENT_AMBULATORY_CARE_PROVIDER_SITE_OTHER): Payer: Managed Care, Other (non HMO)

## 2015-05-16 ENCOUNTER — Encounter: Payer: Self-pay | Admitting: Advanced Practice Midwife

## 2015-05-16 ENCOUNTER — Ambulatory Visit (INDEPENDENT_AMBULATORY_CARE_PROVIDER_SITE_OTHER): Payer: Managed Care, Other (non HMO) | Admitting: Advanced Practice Midwife

## 2015-05-16 ENCOUNTER — Other Ambulatory Visit: Payer: Self-pay | Admitting: General Surgery

## 2015-05-16 VITALS — BP 110/62 | HR 100 | Wt 198.0 lb

## 2015-05-16 DIAGNOSIS — O09522 Supervision of elderly multigravida, second trimester: Secondary | ICD-10-CM

## 2015-05-16 DIAGNOSIS — O0992 Supervision of high risk pregnancy, unspecified, second trimester: Secondary | ICD-10-CM

## 2015-05-16 DIAGNOSIS — O09523 Supervision of elderly multigravida, third trimester: Secondary | ICD-10-CM

## 2015-05-16 DIAGNOSIS — O0993 Supervision of high risk pregnancy, unspecified, third trimester: Secondary | ICD-10-CM

## 2015-05-16 DIAGNOSIS — Z1389 Encounter for screening for other disorder: Secondary | ICD-10-CM

## 2015-05-16 DIAGNOSIS — R1084 Generalized abdominal pain: Secondary | ICD-10-CM

## 2015-05-16 DIAGNOSIS — Z331 Pregnant state, incidental: Secondary | ICD-10-CM

## 2015-05-16 LAB — POCT URINALYSIS DIPSTICK
Glucose, UA: NEGATIVE
Ketones, UA: NEGATIVE
LEUKOCYTES UA: NEGATIVE
NITRITE UA: NEGATIVE
PROTEIN UA: NEGATIVE
RBC UA: NEGATIVE

## 2015-05-16 NOTE — Progress Notes (Signed)
[place] 28+2 wks,cx 3.3 cm,ant pl 1,cephalic, afi [id number] rt ov,simple cyst lt ov 4.8 x 4.6 x 2.6cm,fhr 140 bpm,efw 1223g

## 2015-05-16 NOTE — Progress Notes (Signed)
Fetal Surveillance Testing today:  [place]   High Risk Pregnancy Diagnosis(es):   AMA 40  G3P1001 [redacted]w[redacted]d Estimated Date of Delivery: 08/06/15  Blood pressure 110/62, pulse 100, weight 198 lb (89.812 kg), last menstrual period 10/30/2014, unknown if currently breastfeeding.  Urinalysis: Negative   HPI: The patient is being seen today for ongoing management of AMA, pregnancy. Today she reports no complaints.  She is managing her GB pain with diet   BP weight and urine results all reviewed and noted. Patient reports good fetal movement, denies any bleeding and no rupture of membranes symptoms or regular contractions.  Patient is without complaints other than noted in her HPI.  GI MD wants to do a MRI to look for stones in bile duct (never had an [place]). Plans to ask why she cant do US9 All questions were answered.  All lab and sonogram results have been reviewed. Comments:  [place] 28+2 wks,cx 3.3 cm,ant pl 1,cephalic, afi 123XX123 rt ov,simple cyst lt ov 4.8 x 4.6 x 2.6cm,fhr 140 bpm,efw 1223g   Assessment:  1.  Pregnancy at [redacted]w[redacted]d,  Estimated Date of Delivery: 08/06/15 :                          2.  AMA 40                        3.    Medication(s) Plans:  none  Treatment Plan:  Growth [place] 32 and 36 weeks; twice weekly NST >36 weeks; IOL at 40 weeks.  GI MD wasn't triamine, [id number] and  folic acid leverl checked  Return in about 2 weeks (around 05/30/2015) for HROB. for appointment for high risk OB care  Meds ordered this encounter  Medications  . ferrous sulfate 325 (65 FE) MG tablet    Sig: Take 325 mg by mouth daily with breakfast.   Orders Placed This Encounter  Procedures  . POCT urinalysis dipstick

## 2015-05-16 NOTE — Progress Notes (Signed)
Pt denies any problems or concerns at this time.  

## 2015-05-20 ENCOUNTER — Other Ambulatory Visit: Payer: Self-pay | Admitting: General Surgery

## 2015-05-20 ENCOUNTER — Ambulatory Visit
Admission: RE | Admit: 2015-05-20 | Discharge: 2015-05-20 | Disposition: A | Discharge status: Home / Self Care | Payer: Managed Care, Other (non HMO) | Source: Ambulatory Visit | Attending: General Surgery | Admitting: General Surgery

## 2015-05-20 DIAGNOSIS — R1084 Generalized abdominal pain: Secondary | ICD-10-CM

## 2015-05-23 ENCOUNTER — Encounter: Payer: Self-pay | Admitting: Advanced Practice Midwife

## 2015-05-30 ENCOUNTER — Ambulatory Visit (INDEPENDENT_AMBULATORY_CARE_PROVIDER_SITE_OTHER): Payer: Managed Care, Other (non HMO) | Admitting: Advanced Practice Midwife

## 2015-05-30 VITALS — BP 132/86 | HR 86 | Wt 200.5 lb

## 2015-05-30 DIAGNOSIS — Z1389 Encounter for screening for other disorder: Secondary | ICD-10-CM

## 2015-05-30 DIAGNOSIS — O2393 Unspecified genitourinary tract infection in pregnancy, third trimester: Secondary | ICD-10-CM

## 2015-05-30 DIAGNOSIS — O09523 Supervision of elderly multigravida, third trimester: Secondary | ICD-10-CM

## 2015-05-30 DIAGNOSIS — Z331 Pregnant state, incidental: Secondary | ICD-10-CM

## 2015-05-30 DIAGNOSIS — O0993 Supervision of high risk pregnancy, unspecified, third trimester: Secondary | ICD-10-CM

## 2015-05-30 DIAGNOSIS — Z3A31 31 weeks gestation of pregnancy: Secondary | ICD-10-CM

## 2015-05-30 LAB — POCT URINALYSIS DIPSTICK
Blood, UA: NEGATIVE
Glucose, UA: NEGATIVE
KETONES UA: NEGATIVE
LEUKOCYTES UA: NEGATIVE
Nitrite, UA: NEGATIVE
PROTEIN UA: NEGATIVE

## 2015-05-30 NOTE — Progress Notes (Signed)
Fetal Surveillance Testing today:  doppler   High Risk Pregnancy Diagnosis(es):   AMA  G3P1001 [redacted]w[redacted]d Estimated Date of Delivery: 08/06/15  Blood pressure 132/86, pulse 86, weight 200 lb 8 oz (90.946 kg), last menstrual period 10/30/2014, unknown if currently breastfeeding.  Urinalysis: Negative   HPI: The patient is being seen today for ongoing management of AMA. Today she reports no complaints. GOing to [place] in 3 weeks.  C/O vaginal itch for about a week   BP weight and urine results all reviewed and noted. Patient reports good fetal movement, denies any bleeding and no rupture of membranes symptoms or regular contractions.  Fundal Height:  29 Fetal Heart rate:  131 Edema:  1+--missed nap at work today SSE:  Discharge c/w yeast  Patient is without complaints other than noted in her HPI. All questions were answered.  All lab and sonogram results have been reviewed. Comments:  none  Assessment:  1.  Pregnancy at [redacted]w[redacted]d,  Estimated Date of Delivery: 08/06/15 :                          2.  AMA                        3.  Yeast infection  Medication(s) Plans:  OTC 7 day tx  Treatment Plan:  Growth US/BPP 32 weeks.  Twice weekly testing >36 weeks  No Follow-up on file. for appointment for high risk OB care  No orders of the defined types were placed in this encounter.   Orders Placed This Encounter  Procedures  . POCT urinalysis dipstick

## 2015-06-12 ENCOUNTER — Other Ambulatory Visit: Payer: Self-pay | Admitting: Advanced Practice Midwife

## 2015-06-12 DIAGNOSIS — O09523 Supervision of elderly multigravida, third trimester: Secondary | ICD-10-CM

## 2015-06-12 DIAGNOSIS — O0993 Supervision of high risk pregnancy, unspecified, third trimester: Secondary | ICD-10-CM

## 2015-06-13 ENCOUNTER — Encounter: Payer: Self-pay | Admitting: Advanced Practice Midwife

## 2015-06-13 ENCOUNTER — Ambulatory Visit (INDEPENDENT_AMBULATORY_CARE_PROVIDER_SITE_OTHER): Payer: Managed Care, Other (non HMO)

## 2015-06-13 ENCOUNTER — Ambulatory Visit (INDEPENDENT_AMBULATORY_CARE_PROVIDER_SITE_OTHER): Payer: Managed Care, Other (non HMO) | Admitting: Advanced Practice Midwife

## 2015-06-13 VITALS — BP 114/74 | HR 80 | Wt 202.0 lb

## 2015-06-13 DIAGNOSIS — O09523 Supervision of elderly multigravida, third trimester: Secondary | ICD-10-CM

## 2015-06-13 DIAGNOSIS — O0993 Supervision of high risk pregnancy, unspecified, third trimester: Secondary | ICD-10-CM

## 2015-06-13 DIAGNOSIS — Z331 Pregnant state, incidental: Secondary | ICD-10-CM | POA: Diagnosis not present

## 2015-06-13 DIAGNOSIS — D649 Anemia, unspecified: Secondary | ICD-10-CM

## 2015-06-13 DIAGNOSIS — Z3A33 33 weeks gestation of pregnancy: Secondary | ICD-10-CM | POA: Diagnosis not present

## 2015-06-13 DIAGNOSIS — Z1389 Encounter for screening for other disorder: Secondary | ICD-10-CM

## 2015-06-13 DIAGNOSIS — Z369 Encounter for antenatal screening, unspecified: Secondary | ICD-10-CM

## 2015-06-13 LAB — POCT URINALYSIS DIPSTICK
Glucose, UA: NEGATIVE
Ketones, UA: NEGATIVE
Leukocytes, UA: NEGATIVE
NITRITE UA: NEGATIVE
PROTEIN UA: NEGATIVE
RBC UA: NEGATIVE

## 2015-06-13 LAB — POCT HEMOGLOBIN: HEMOGLOBIN: 11.6 g/dL — AB (ref 12.2–16.2)

## 2015-06-13 NOTE — Progress Notes (Signed)
Fetal Surveillance Testing today:  [place]   High Risk Pregnancy Diagnosis(es):   AMA 40+  G3P1001 [redacted]w[redacted]d Estimated Date of Delivery: 08/06/15  Last menstrual period 10/30/2014, unknown if currently breastfeeding.  Urinalysis: Not examined   HPI: The patient is being seen today for ongoing management of AMA pregnancy. Today she reports ice craving, restless leg.    BP weight and urine results all reviewed and noted. Patient reports good fetal movement, denies any bleeding and no rupture of membranes symptoms or regular contractions.   Edema:  no  Patient is without complaints other than noted in her HPI. All questions were answered.  All lab and sonogram results have been reviewed. Comments:  [place] 32+2wks,cephalic,efw 0000000 g 0000000 15.5,normal rt ov,simple lt ov cyst 5.1 x 2.8 x 4cm,ant pl gr 2,RI .52,.56,.63,BPP 8/8  Assessment:  1.  Pregnancy at [redacted]w[redacted]d,  Estimated Date of Delivery: 08/06/15 :                          2.  AMA                        3.    Medication(s) Plans:  none  Treatment Plan:  Twice weekly testing at 36 weeks, IOL 40 weeks  Return in about 2 weeks (around 06/27/2015) for HROB. for appointment for high risk OB care  No orders of the defined types were placed in this encounter.   Orders Placed This Encounter  Procedures  . POCT urinalysis dipstick

## 2015-06-13 NOTE — Progress Notes (Addendum)
[place] 32+2wks,cephalic,efw 0000000 g 0000000 15.5,normal rt ov,simple lt ov cyst 5.1 x 2.8 x 4cm,ant pl gr 2,RI .57,.56,.63,BPP 8/8

## 2015-06-27 ENCOUNTER — Ambulatory Visit (INDEPENDENT_AMBULATORY_CARE_PROVIDER_SITE_OTHER): Payer: Managed Care, Other (non HMO) | Admitting: Women's Health

## 2015-06-27 ENCOUNTER — Encounter: Payer: Self-pay | Admitting: Women's Health

## 2015-06-27 VITALS — BP 120/76 | HR 96 | Wt 203.0 lb

## 2015-06-27 DIAGNOSIS — O0993 Supervision of high risk pregnancy, unspecified, third trimester: Secondary | ICD-10-CM

## 2015-06-27 DIAGNOSIS — R05 Cough: Secondary | ICD-10-CM

## 2015-06-27 DIAGNOSIS — J069 Acute upper respiratory infection, unspecified: Secondary | ICD-10-CM

## 2015-06-27 DIAGNOSIS — Z331 Pregnant state, incidental: Secondary | ICD-10-CM | POA: Diagnosis not present

## 2015-06-27 DIAGNOSIS — Z1389 Encounter for screening for other disorder: Secondary | ICD-10-CM | POA: Diagnosis not present

## 2015-06-27 DIAGNOSIS — J029 Acute pharyngitis, unspecified: Secondary | ICD-10-CM | POA: Diagnosis not present

## 2015-06-27 DIAGNOSIS — O09523 Supervision of elderly multigravida, third trimester: Secondary | ICD-10-CM

## 2015-06-27 DIAGNOSIS — R0981 Nasal congestion: Secondary | ICD-10-CM

## 2015-06-27 LAB — POCT URINALYSIS DIPSTICK
GLUCOSE UA: NEGATIVE
Ketones, UA: NEGATIVE
LEUKOCYTES UA: NEGATIVE
NITRITE UA: NEGATIVE
Protein, UA: NEGATIVE
RBC UA: NEGATIVE

## 2015-06-27 NOTE — Progress Notes (Signed)
High Risk Pregnancy Diagnosis(es): AMA G2P1001 [redacted]w[redacted]d Estimated Date of Delivery: 08/06/15 BP 120/76 mmHg  Pulse 96  Wt 203 lb (92.08 kg)  LMP 10/30/2014  Urinalysis: Negative HPI:  Cold sx began Sat pm (4d ago) productive cough, nasal/sinus congestion, slightly sore throat. Denies fever/chills, headache/bodyache/earache. No sick contacts but did just get back from [facility]. Has been using robitussin and sudafed, feeling better today.  BP, weight, and urine reviewed.  Reports good fm. Denies regular uc's, lof, vb, uti s/s.   Throat w/o erythema/exudate HRRR LCTAB Fundal Height:  34 Fetal Heart rate:  144  Edema:  trace  Reviewed ptl s/s, fkc. Reviewed and gave print-out of uri relief measures. To call if not improving after 7-10d from onset, or worsening.  All questions were answered Assessment: [redacted]w[redacted]d AMA Medication(s) Plans:  otc relief measures for uri Treatment Plan:  U/S @ 20, 24, 28, 32, 36wks    2x/wk testing nst/sono @ 36wks    Deliver @ 39wks (RLTCS) Follow up in 2wks for high-risk OB appt and efw/afi/bpp/dopp u/s, to begin 2x/wk testing then

## 2015-06-27 NOTE — Patient Instructions (Signed)
You have a viral infection that will resolve on its own over time.  Symptoms typically last 3-7 days but can stretch out to 2-3 weeks.  Unfortunately, antibiotics are not helpful for viral infections.  Humidifier and saline nasal spray for nasal congestion  Regular robitussin, cough drops for cough  Warm salt water gargles for sore throat  Mucinex with lots of water to help you cough up the mucous in your chest if needed  Drink plenty of fluids and stay hydrated!  Wash your hands frequently.  Call if you are not improving by 7-10 days.     Call the office [phone]) or go to [facility] if:  You begin to have strong, frequent contractions  Your water breaks.  Sometimes it is a big gush of fluid, sometimes it is just a trickle that keeps getting your panties wet or running down your legs  You have vaginal bleeding.  It is normal to have a small amount of spotting if your cervix was checked.   You don't feel your baby moving like normal.  If you don't, get you something to eat and drink and lay down and focus on feeling your baby move.  You should feel at least 10 movements in 2 hours.  If you don't, you should call the office or go to [place] Preterm labor is when labor starts at less than 37 weeks of pregnancy. The normal length of a pregnancy is 39 to 41 weeks. CAUSES Often, there is no identifiable underlying cause as to why a woman goes into preterm labor. One of the most common known causes of preterm labor is infection. Infections of the uterus, cervix, vagina, amniotic sac, bladder, kidney, or even the lungs (pneumonia) can cause labor to start. Other suspected causes of preterm labor include:   Urogenital infections, such as yeast infections and bacterial vaginosis.   Uterine abnormalities (uterine shape, uterine septum, fibroids, or bleeding from the placenta).   A cervix that has been operated on (it may fail to stay closed).    Malformations in the fetus.   Multiple gestations (twins, triplets, and so on).   Breakage of the amniotic sac.  RISK FACTORS  Having a previous history of preterm labor.   Having premature rupture of membranes (PROM).   Having a placenta that covers the opening of the cervix (placenta previa).   Having a placenta that separates from the uterus (placental abruption).   Having a cervix that is too weak to hold the fetus in the uterus (incompetent cervix).   Having too much fluid in the amniotic sac (polyhydramnios).   Taking illegal drugs or smoking while pregnant.   Not gaining enough weight while pregnant.   Being younger than 31 and older than 41 years old.   Having a low socioeconomic status.   Being African American. SYMPTOMS Signs and symptoms of preterm labor include:   Menstrual-like cramps, abdominal pain, or back pain.  Uterine contractions that are regular, as frequent as six in an hour, regardless of their intensity (may be mild or painful).  Contractions that start on the top of the uterus and spread down to the lower abdomen and back.   A sense of increased pelvic pressure.   A watery or bloody mucus discharge that comes from the vagina.  TREATMENT Depending on the length of the pregnancy and other circumstances, your health care provider may suggest bed rest. If necessary, there are medicines that can be  given to stop contractions and to mature the fetal lungs. If labor happens before 34 weeks of pregnancy, a prolonged hospital stay may be recommended. Treatment depends on the condition of both you and the fetus.  WHAT SHOULD YOU DO IF YOU THINK YOU ARE IN PRETERM LABOR? Call your health care provider right away. You will need to go to the hospital to get checked immediately. HOW CAN YOU PREVENT PRETERM LABOR IN FUTURE PREGNANCIES? You should:   Stop smoking if you smoke.  Maintain healthy weight gain and avoid chemicals and drugs  that are not necessary.  Be watchful for any type of infection.  Inform your health care provider if you have a known history of preterm labor.   This information is not intended to replace advice given to you by your health care provider. Make sure you discuss any questions you have with your health care provider.   Document Released: 06/28/2003 Document Revised: 12/08/2012 Document Reviewed: 05/10/2012 Elsevier Interactive Patient Education [employer].

## 2015-07-10 ENCOUNTER — Ambulatory Visit (INDEPENDENT_AMBULATORY_CARE_PROVIDER_SITE_OTHER): Payer: Managed Care, Other (non HMO)

## 2015-07-10 ENCOUNTER — Ambulatory Visit (INDEPENDENT_AMBULATORY_CARE_PROVIDER_SITE_OTHER): Payer: Managed Care, Other (non HMO) | Admitting: Obstetrics and Gynecology

## 2015-07-10 ENCOUNTER — Telehealth: Payer: Self-pay | Admitting: Obstetrics and Gynecology

## 2015-07-10 ENCOUNTER — Encounter: Payer: Self-pay | Admitting: Obstetrics and Gynecology

## 2015-07-10 VITALS — BP 112/70 | HR 87 | Wt 203.5 lb

## 2015-07-10 DIAGNOSIS — Z331 Pregnant state, incidental: Secondary | ICD-10-CM

## 2015-07-10 DIAGNOSIS — Z1389 Encounter for screening for other disorder: Secondary | ICD-10-CM

## 2015-07-10 DIAGNOSIS — O0993 Supervision of high risk pregnancy, unspecified, third trimester: Secondary | ICD-10-CM

## 2015-07-10 DIAGNOSIS — O09523 Supervision of elderly multigravida, third trimester: Secondary | ICD-10-CM

## 2015-07-10 DIAGNOSIS — O34219 Maternal care for unspecified type scar from previous cesarean delivery: Secondary | ICD-10-CM

## 2015-07-10 LAB — POCT URINALYSIS DIPSTICK
Blood, UA: NEGATIVE
Glucose, UA: NEGATIVE
KETONES UA: NEGATIVE
Leukocytes, UA: NEGATIVE
Nitrite, UA: NEGATIVE
PROTEIN UA: NEGATIVE

## 2015-07-10 NOTE — Progress Notes (Signed)
Tired, insomnia. Schedule c-section.

## 2015-07-10 NOTE — Progress Notes (Signed)
[place] 36+1wks,cephalic,ant pl gr 2,bpp 8/8, RI .51,.52,FHR 138 bpm,afi 13cm,simple lt ov cyst 5.5 x 3 x 4.1cm,normal rt ov,efw 3119 g 68%

## 2015-07-10 NOTE — Telephone Encounter (Signed)
Repeat cesarean section scheduled for Mon April 10 @ 7:30 am [facility] Case [id number]

## 2015-07-11 NOTE — Progress Notes (Signed)
High Risk Pregnancy Diagnosis(es):   Repeat c/s, contraception counsel, abd laxity s/p gastric bypass.  G2P1001 [redacted]w[redacted]d Estimated Date of Delivery: 08/06/15    HPI: The patient is being seen today for ongoing management of term gestation. Discussed BC; husband reluctant to do vas, pt informedof BTL clips option, will decide after discussion with husb.. Today she reports abd laxity s/p 100 lb wt loss by bypass. Patient reports good fetal movement, denies any bleeding and no rupture of membranes symptoms or regular contractions.   BP weight and urine results reviewed and noted. Blood pressure 112/70, pulse 87, weight 203 lb 8 oz (92.307 kg), last menstrual period 10/30/2014, unknown if currently breastfeeding.  Fetal Surveillance Testing today:  no Fundal Height:  36 Fetal Heart rate:  145 Edema:  neg Urinalysis: Negative   Questions were answered.  Lab and sonogram results have been reviewed. Comments: normal   Assessment:  1.  Pregnancy at [redacted]w[redacted]d,  Estimated Date of Delivery: 08/06/15 :  Prior cs for repeat                        2.  Desire for sterilization, pt to discuss and reply at f/u visit                         3.  abd skin laxity p wt loss. Discussed wide excision of cicatrix  Medication(s) Plans:  none  Treatment Plan:  Finalize btl plans next wk check if medicaid , sign paper if needed                               Add btl to surgical plans                               Final decision re wide exc of cicatrix 1 wk  Follow up in 1 weeks for appointment for high risk OB care,

## 2015-07-12 DIAGNOSIS — Z029 Encounter for administrative examinations, unspecified: Secondary | ICD-10-CM

## 2015-07-17 ENCOUNTER — Ambulatory Visit (INDEPENDENT_AMBULATORY_CARE_PROVIDER_SITE_OTHER): Payer: Managed Care, Other (non HMO) | Admitting: Obstetrics and Gynecology

## 2015-07-17 ENCOUNTER — Encounter: Payer: Self-pay | Admitting: Obstetrics and Gynecology

## 2015-07-17 VITALS — BP 120/80 | HR 80 | Wt 202.6 lb

## 2015-07-17 DIAGNOSIS — Z3009 Encounter for other general counseling and advice on contraception: Secondary | ICD-10-CM | POA: Insufficient documentation

## 2015-07-17 DIAGNOSIS — Z118 Encounter for screening for other infectious and parasitic diseases: Secondary | ICD-10-CM

## 2015-07-17 DIAGNOSIS — Z1389 Encounter for screening for other disorder: Secondary | ICD-10-CM

## 2015-07-17 DIAGNOSIS — Z331 Pregnant state, incidental: Secondary | ICD-10-CM

## 2015-07-17 DIAGNOSIS — Z3685 Encounter for antenatal screening for Streptococcus B: Secondary | ICD-10-CM

## 2015-07-17 DIAGNOSIS — Z3493 Encounter for supervision of normal pregnancy, unspecified, third trimester: Secondary | ICD-10-CM

## 2015-07-17 DIAGNOSIS — Z1159 Encounter for screening for other viral diseases: Secondary | ICD-10-CM

## 2015-07-17 LAB — POCT URINALYSIS DIPSTICK
GLUCOSE UA: NEGATIVE
Ketones, UA: NEGATIVE
LEUKOCYTES UA: NEGATIVE
NITRITE UA: NEGATIVE
Protein, UA: NEGATIVE
RBC UA: NEGATIVE

## 2015-07-17 NOTE — Progress Notes (Signed)
G2P1001 [redacted]w[redacted]d Estimated Date of Delivery: 08/06/15  Blood pressure 120/80, pulse 80, weight 202 lb 9.6 oz (91.899 kg), last menstrual period 10/30/2014, unknown if currently breastfeeding.   refer to the ob flow sheet for FH and FHR, also BP, Wt, Urine results:notable for neg protein glucose  Patient reports   good fetal movement, denies any bleeding and no rupture of membranes symptoms or regular contractions. Patient complaints:desires BTL at c/s ,also wide excision of cicatrix.for abd laxity s/p gastric bypass. Area to be excised marked off as example..  Questions were answered. Assessment: LROB G2P1001 @ [redacted]w[redacted]d prior c/s for repeat 4/10 and btl +wide excision of cicatrix.                            GBS and GC/Chl collected. Plan:  Continued routine obstetrical care, case scheduled as requested  F/u in 1 weeks for ob appt Jonnie Kind, MD

## 2015-07-18 ENCOUNTER — Telehealth (HOSPITAL_COMMUNITY): Payer: Self-pay | Admitting: *Deleted

## 2015-07-18 LAB — GC/CHLAMYDIA PROBE AMP
Chlamydia trachomatis, NAA: NEGATIVE
Neisseria gonorrhoeae by PCR: NEGATIVE

## 2015-07-18 NOTE — Telephone Encounter (Signed)
Preadmission screen  

## 2015-07-19 LAB — STREP GP B NAA+RFLX: Strep Gp B NAA+Rflx: NEGATIVE

## 2015-07-23 ENCOUNTER — Telehealth (HOSPITAL_COMMUNITY): Payer: Self-pay | Admitting: *Deleted

## 2015-07-23 ENCOUNTER — Encounter (HOSPITAL_COMMUNITY): Payer: Self-pay | Admitting: *Deleted

## 2015-07-23 NOTE — Telephone Encounter (Signed)
Preadmission screen  

## 2015-07-24 ENCOUNTER — Other Ambulatory Visit (INDEPENDENT_AMBULATORY_CARE_PROVIDER_SITE_OTHER): Payer: Managed Care, Other (non HMO)

## 2015-07-24 ENCOUNTER — Other Ambulatory Visit: Payer: Self-pay | Admitting: Obstetrics and Gynecology

## 2015-07-24 ENCOUNTER — Ambulatory Visit (INDEPENDENT_AMBULATORY_CARE_PROVIDER_SITE_OTHER): Payer: Managed Care, Other (non HMO) | Admitting: Women's Health

## 2015-07-24 ENCOUNTER — Encounter: Payer: Self-pay | Admitting: Women's Health

## 2015-07-24 ENCOUNTER — Inpatient Hospital Stay (HOSPITAL_COMMUNITY): Admit: 2015-07-24 | Payer: Self-pay

## 2015-07-24 VITALS — BP 116/72 | HR 76 | Wt 205.0 lb

## 2015-07-24 DIAGNOSIS — O09523 Supervision of elderly multigravida, third trimester: Secondary | ICD-10-CM

## 2015-07-24 DIAGNOSIS — Z331 Pregnant state, incidental: Secondary | ICD-10-CM

## 2015-07-24 DIAGNOSIS — O0993 Supervision of high risk pregnancy, unspecified, third trimester: Secondary | ICD-10-CM

## 2015-07-24 DIAGNOSIS — Z1389 Encounter for screening for other disorder: Secondary | ICD-10-CM

## 2015-07-24 DIAGNOSIS — Z3A39 39 weeks gestation of pregnancy: Secondary | ICD-10-CM

## 2015-07-24 LAB — POCT URINALYSIS DIPSTICK
Blood, UA: NEGATIVE
Ketones, UA: NEGATIVE
NITRITE UA: NEGATIVE
PROTEIN UA: NEGATIVE

## 2015-07-24 NOTE — Progress Notes (Signed)
High Risk Pregnancy Diagnosis(es): AMA G3P1011 [redacted]w[redacted]d Estimated Date of Delivery: 08/06/15 BP 116/72 mmHg  Pulse 76  Wt 205 lb (92.987 kg)  LMP 10/30/2014  Urinalysis: Positive for 3+glucosuria HPI:  Doing well, lots of pressure and regular uc's this weekend. 3+ glucosuria, ate pound cake for breakfast and lots of carbs at lunch BP, weight, and urine reviewed.  Reports good fm. Denies regular uc's, lof, vb, uti s/s.   Fundal Height:  35 Fetal Heart rate:  132 Edema:  None SVE per request: 1/50/-2, vtx  Reviewed labor s/s, fkc All questions were answered Assessment: [redacted]w[redacted]d AMA Medication(s) Plans:  n/a Treatment Plan:  Not in antenatal testing, will work in today for bpp/dopp/efw/afi, then Friday for nst, RLTCS & BTL Monday Follow up in 3d for high-risk OB appt and NST

## 2015-07-24 NOTE — Patient Instructions (Signed)
Call the office ([phone]) or go to [facility] if:  You begin to have strong, frequent contractions  Your water breaks.  Sometimes it is a big gush of fluid, sometimes it is just a trickle that keeps getting your panties wet or running down your legs  You have vaginal bleeding.  It is normal to have a small amount of spotting if your cervix was checked.   You don't feel your baby moving like normal.  If you don't, get you something to eat and drink and lay down and focus on feeling your baby move.  You should feel at least 10 movements in 2 hours.  If you don't, you should call the office or go to [facility].    Braxton Hicks Contractions Contractions of the uterus can occur throughout pregnancy. Contractions are not always a sign that you are in labor.  WHAT ARE BRAXTON HICKS CONTRACTIONS?  Contractions that occur before labor are called Braxton Hicks contractions, or false labor. Toward the end of pregnancy (32-34 weeks), these contractions can develop more often and may become more forceful. This is not true labor because these contractions do not result in opening (dilatation) and thinning of the cervix. They are sometimes difficult to tell apart from true labor because these contractions can be forceful and people have different pain tolerances. You should not feel embarrassed if you go to the hospital with false labor. Sometimes, the only way to tell if you are in true labor is for your health care provider to look for changes in the cervix. If there are no prenatal problems or other health problems associated with the pregnancy, it is completely safe to be sent home with false labor and await the onset of true labor. HOW CAN YOU TELL THE DIFFERENCE BETWEEN TRUE AND FALSE LABOR? False Labor  The contractions of false labor are usually shorter and not as hard as those of true labor.   The contractions are usually irregular.   The contractions are often felt in the front of  the lower abdomen and in the groin.   The contractions may go away when you walk around or change positions while lying down.   The contractions get weaker and are shorter lasting as time goes on.   The contractions do not usually become progressively stronger, regular, and closer together as with true labor.  True Labor  Contractions in true labor last 30-70 seconds, become very regular, usually become more intense, and increase in frequency.   The contractions do not go away with walking.   The discomfort is usually felt in the top of the uterus and spreads to the lower abdomen and low back.   True labor can be determined by your health care provider with an exam. This will show that the cervix is dilating and getting thinner.  WHAT TO REMEMBER  Keep up with your usual exercises and follow other instructions given by your health care provider.   Take medicines as directed by your health care provider.   Keep your regular prenatal appointments.   Eat and drink lightly if you think you are going into labor.   If Braxton Hicks contractions are making you uncomfortable:   Change your position from lying down or resting to walking, or from walking to resting.   Sit and rest in a tub of warm water.   Drink 2-3 glasses of water. Dehydration may cause these contractions.   Do slow and deep breathing several times an hour.    WHEN SHOULD I SEEK IMMEDIATE MEDICAL CARE? Seek immediate medical care if:  Your contractions become stronger, more regular, and closer together.   You have fluid leaking or gushing from your vagina.   You have a fever.   You pass blood-tinged mucus.   You have vaginal bleeding.   You have continuous abdominal pain.   You have low back pain that you never had before.   You feel your baby's head pushing down and causing pelvic pressure.   Your baby is not moving as much as it used to.    This information is not intended to  replace advice given to you by your health care provider. Make sure you discuss any questions you have with your health care provider.   Document Released: 04/07/2005 Document Revised: 04/12/2013 Document Reviewed: 01/17/2013 Elsevier Interactive Patient Education 2016 Elsevier Inc.  

## 2015-07-24 NOTE — Progress Notes (Signed)
[place] [id number] wks,cephalic, ant pl gr 2, BPP 8/8,FHR 124 bpm,afi 15.7cm,RI .42,.48,simple left ov cyst is slightly larger 6.3 x 5.8 x 2.8cm,normal rt ov,efw 3781g 86%

## 2015-07-24 NOTE — H&P (Signed)
Kristen Dunn is a 40 y.o. female presenting for Repeat cesarean section at 39wks, tubal sterilization by filshie clip placement, and wide excision of cicatrix. History She is a G3P1011 at 39.0 wk who is not interested in [facility], and who desires permanent sterilization by Filshie Clip Placement, and who has significant skin laxity s/p Gastric bypass, who desires excision of a portion of the redundant skin to improve skin care and wound care. She is well aware that there are increased risk of wound complication such as hematoma or infection based on the increased size of the incision, but also reduced care issues due to reduced moisture trapping in redundant skin folds. OB History    Gravida Para Term Preterm AB TAB SAB Ectopic Multiple Living   3 1 1  0 1 0 1 0 0 1     Past Medical History  Diagnosis Date  . Asthma   . Anemia   . AMA (advanced maternal age) multigravida 67+   . Gestational diabetes     glyburide  . PONV (postoperative nausea and vomiting)   . Hx of migraines 10/26/2012  . Other and unspecified ovarian cyst 03/30/2013   Past Surgical History  Procedure Laterality Date  . Gastric bypass 2008    . Tonsillectomy    . Cesarean section  01/13/2012    Procedure: CESAREAN SECTION;  Surgeon: Mora Bellman, MD;  Location: [facility];  Service: Obstetrics;  Laterality: N/A;  . Incisional hernia repair  03/31/2012    Procedure: LAPAROSCOPIC INCISIONAL HERNIA;  Surgeon: Edward Jolly, MD;  Location: [facility];  Service: General;  Laterality: N/A;  LAPAROSCOPY REPAIR INTERNAL HERNIA  petersons defect  . Laparoscopic gastric banding with hiatal hernia repair N/A 03/14/2013    Procedure: DIAGNOSTIC LAPAROSCOPY  WITH  INTERNAL HERNIA  REDUCTION WITH CLOSURE;  Surgeon: Shann Medal, MD;  Location: [facility];  Service: General;  Laterality: N/A;   Family History: family history includes Aneurysm in her father; Asthma in her mother and sister; COPD in her mother; Cancer in her maternal aunt;  Hypertension in her father; Stroke in her maternal grandmother; Thyroid disease in her maternal grandmother; Von Willebrand disease in her cousin. Social History:  reports that she has never smoked. She has never used smokeless tobacco. She reports that she does not drink alcohol or use illicit drugs.   Prenatal Transfer Tool  Maternal Diabetes: No Genetic Screening: Normal Maternal Ultrasounds/Referrals: Normal Fetal Ultrasounds or other Referrals:  None Maternal Substance Abuse:  No Significant Maternal Medications:  None Significant Maternal Lab Results:  Lab values include: Group B Strep negative Other Comments:  None  Review of Systems  Constitutional: Negative.   Gastrointestinal: Negative.   Genitourinary: Negative.   Neurological: Negative.   Endo/Heme/Allergies: Negative.   All other systems reviewed and are negative.     Last menstrual period 10/30/2014, unknown if currently breastfeeding.  LMP 10/30/2014   Exam Physical Exam  Vitals reviewed. Constitutional: She is oriented to person, place, and time. She appears well-developed and well-nourished.  Cardiovascular: Normal rate.   Respiratory: Effort normal.  GI: Soft.  Gravid uterus c/w dates.  Prior lower abd transverse scar. Generalized abdominal laxity with skin creasing that does not extend laterally beyond the iliac crests. No significant pannus.  Genitourinary: Vagina normal.  Musculoskeletal: Normal range of motion.  Neurological: She is alert and oriented to person, place, and time.  Skin: Skin is warm and dry. No erythema.  Psychiatric: She has a normal mood and affect.  Her behavior is normal. Judgment and thought content normal.    Prenatal labs: ABO, Rh: A/Positive/-- (09/12 1611) Antibody: Negative (01/17 0911) Rubella: 1.17 (09/12 1611) RPR: Non Reactive (01/17 0911)  HBsAg: Negative (09/12 1611)  HIV: Non Reactive (01/17 0911)  GBS:   neg   Assessment/Plan: Prenancy 39 wks, prior cesarean  declining tolac Desire for permanent sterilization Abdominal laxity s/p gastric bypass and weight loss. PLAN Repeat cesarean , bilateral sterilization by filshie clips Wide excision of old cesarean scar (cicatrix) Risks, rationale reviewed with patient at prenatal visits.   Saliha Salts V 07/24/2015, 2:36 PM

## 2015-07-27 ENCOUNTER — Encounter: Payer: Self-pay | Admitting: Obstetrics & Gynecology

## 2015-07-27 ENCOUNTER — Encounter (HOSPITAL_COMMUNITY)
Admission: RE | Admit: 2015-07-27 | Discharge: 2015-07-27 | Disposition: A | Discharge status: Home / Self Care | Payer: Managed Care, Other (non HMO) | Source: Ambulatory Visit | Attending: Obstetrics and Gynecology | Admitting: Obstetrics and Gynecology

## 2015-07-27 ENCOUNTER — Ambulatory Visit (INDEPENDENT_AMBULATORY_CARE_PROVIDER_SITE_OTHER): Payer: Managed Care, Other (non HMO) | Admitting: Obstetrics & Gynecology

## 2015-07-27 VITALS — BP 120/78 | HR 92 | Wt 207.0 lb

## 2015-07-27 DIAGNOSIS — O0993 Supervision of high risk pregnancy, unspecified, third trimester: Secondary | ICD-10-CM

## 2015-07-27 DIAGNOSIS — O09523 Supervision of elderly multigravida, third trimester: Secondary | ICD-10-CM

## 2015-07-27 DIAGNOSIS — Z331 Pregnant state, incidental: Secondary | ICD-10-CM

## 2015-07-27 DIAGNOSIS — Z1389 Encounter for screening for other disorder: Secondary | ICD-10-CM

## 2015-07-27 DIAGNOSIS — Z3A39 39 weeks gestation of pregnancy: Secondary | ICD-10-CM

## 2015-07-27 LAB — POCT URINALYSIS DIPSTICK
Blood, UA: NEGATIVE
GLUCOSE UA: NEGATIVE
KETONES UA: NEGATIVE
NITRITE UA: NEGATIVE
Protein, UA: NEGATIVE

## 2015-07-27 LAB — CBC
HCT: 35.1 % — ABNORMAL LOW (ref 36.0–46.0)
HEMOGLOBIN: 11.4 g/dL — AB (ref 12.0–15.0)
MCH: 28.9 pg (ref 26.0–34.0)
MCHC: 32.5 g/dL (ref 30.0–36.0)
MCV: 89.1 fL (ref 78.0–100.0)
Platelets: 344 10*3/uL (ref 150–400)
RBC: 3.94 MIL/uL (ref 3.87–5.11)
RDW: 13.5 % (ref 11.5–15.5)
WBC: 11.2 10*3/uL — ABNORMAL HIGH (ref 4.0–10.5)

## 2015-07-27 NOTE — Progress Notes (Signed)
Fetal Surveillance Testing today:  Reactive NST   High Risk Pregnancy Diagnosis(es):   AMA  G3P1011 [redacted]w[redacted]d Estimated Date of Delivery: 08/06/15  Blood pressure 120/78, pulse 92, weight 207 lb (93.895 kg), last menstrual period 10/30/2014, unknown if currently breastfeeding.  Urinalysis: Negative   HPI: The patient is being seen today for ongoing management of AMA. Today she reports no problems   BP weight and urine results all reviewed and noted. Patient reports good fetal movement, denies any bleeding and no rupture of membranes symptoms or regular contractions.  Fundal Height:  35 Fetal Heart rate:  150 Edema:  none  Patient is without complaints other than noted in her HPI. All questions were answered.  All lab and sonogram results have been reviewed. Comments: normal   Assessment:  1.  Pregnancy at [redacted]w[redacted]d,  Estimated Date of Delivery: 08/06/15 :                          2.  AMA, for repeat Caesarean section in 3 days                        3.    Medication(s) Plans:  none  Treatment Plan:  As above  No Follow-up on file. for appointment for high risk OB care  No orders of the defined types were placed in this encounter.   Orders Placed This Encounter  Procedures  . POCT urinalysis dipstick

## 2015-07-28 LAB — RPR: RPR Ser Ql: NONREACTIVE

## 2015-07-29 MED ORDER — METRONIDAZOLE IN NACL 5-0.79 MG/ML-% IV SOLN
500.0000 mg | INTRAVENOUS | Status: AC
  Administered 2015-07-30: 500 mg via INTRAVENOUS
  Filled 2015-07-29: qty 100

## 2015-07-29 MED ORDER — GENTAMICIN SULFATE 40 MG/ML IJ SOLN
5.0000 mg/kg | INTRAVENOUS | Status: AC
  Administered 2015-07-30: 360 mg via INTRAVENOUS
  Filled 2015-07-29: qty 9

## 2015-07-29 NOTE — Anesthesia Preprocedure Evaluation (Addendum)
Anesthesia Evaluation  Patient identified by MRN, date of birth, ID band Patient awake    Reviewed: Allergy & Precautions, NPO status , Patient's Chart, lab work & pertinent test results  History of Anesthesia Complications (+) PONV and history of anesthetic complications  Airway Mallampati: II  TM Distance: >3 FB Neck ROM: Full    Dental no notable dental hx.    Pulmonary asthma ,    Pulmonary exam normal breath sounds clear to auscultation       Cardiovascular negative cardio ROS Normal cardiovascular exam Rhythm:Regular Rate:Normal     Neuro/Psych negative neurological ROS  negative psych ROS   GI/Hepatic negative GI ROS, Neg liver ROS,   Endo/Other  diabetes, Type 2  Renal/GU negative Renal ROS  negative genitourinary   Musculoskeletal negative musculoskeletal ROS (+)   Abdominal (+) + obese,   Peds negative pediatric ROS (+)  Hematology  (+) anemia , Hgb 11.4   Anesthesia Other Findings   Reproductive/Obstetrics negative OB ROS                           Anesthesia Physical Anesthesia Plan  ASA: II  Anesthesia Plan: Spinal   Post-op Pain Management:    Induction: Intravenous  Airway Management Planned: Natural Airway  Additional Equipment:   Intra-op Plan:   Post-operative Plan:   Informed Consent: I have reviewed the patients History and Physical, chart, labs and discussed the procedure including the risks, benefits and alternatives for the proposed anesthesia with the patient or authorized representative who has indicated his/her understanding and acceptance.   Dental advisory given  Plan Discussed with: CRNA  Anesthesia Plan Comments: (Discussed risks and benefits of and differences between spinal and general. Discussed risks of spinal including headache, backache, failure, bleeding and hematoma, infection, and nerve damage. Patient consents to spinal. Questions  answered. Platelet count acceptable.)       Anesthesia Quick Evaluation

## 2015-07-30 ENCOUNTER — Encounter (HOSPITAL_COMMUNITY): Payer: Self-pay | Admitting: Emergency Medicine

## 2015-07-30 ENCOUNTER — Encounter (HOSPITAL_COMMUNITY)
Admission: RE | Disposition: A | Discharge status: Home / Self Care | Payer: Self-pay | Source: Ambulatory Visit | Attending: Obstetrics and Gynecology

## 2015-07-30 ENCOUNTER — Inpatient Hospital Stay (HOSPITAL_COMMUNITY)
Admission: RE | Admit: 2015-07-30 | Discharge: 2015-08-01 | DRG: 766 | Disposition: A | Discharge status: Home / Self Care | Payer: Managed Care, Other (non HMO) | Source: Ambulatory Visit | Attending: Obstetrics and Gynecology | Admitting: Obstetrics and Gynecology

## 2015-07-30 ENCOUNTER — Inpatient Hospital Stay (HOSPITAL_COMMUNITY): Payer: Managed Care, Other (non HMO) | Admitting: Anesthesiology

## 2015-07-30 DIAGNOSIS — O34211 Maternal care for low transverse scar from previous cesarean delivery: Principal | ICD-10-CM | POA: Diagnosis present

## 2015-07-30 DIAGNOSIS — J45909 Unspecified asthma, uncomplicated: Secondary | ICD-10-CM | POA: Diagnosis present

## 2015-07-30 DIAGNOSIS — Z98891 History of uterine scar from previous surgery: Secondary | ICD-10-CM

## 2015-07-30 DIAGNOSIS — L574 Cutis laxa senilis: Secondary | ICD-10-CM

## 2015-07-30 DIAGNOSIS — O0993 Supervision of high risk pregnancy, unspecified, third trimester: Secondary | ICD-10-CM

## 2015-07-30 DIAGNOSIS — Z3A39 39 weeks gestation of pregnancy: Secondary | ICD-10-CM

## 2015-07-30 DIAGNOSIS — Z302 Encounter for sterilization: Secondary | ICD-10-CM | POA: Diagnosis not present

## 2015-07-30 DIAGNOSIS — O24425 Gestational diabetes mellitus in childbirth, controlled by oral hypoglycemic drugs: Secondary | ICD-10-CM | POA: Diagnosis present

## 2015-07-30 DIAGNOSIS — O99844 Bariatric surgery status complicating childbirth: Secondary | ICD-10-CM | POA: Diagnosis present

## 2015-07-30 DIAGNOSIS — O9952 Diseases of the respiratory system complicating childbirth: Secondary | ICD-10-CM | POA: Diagnosis present

## 2015-07-30 HISTORY — DX: Family history of other specified conditions: Z84.89

## 2015-07-30 LAB — URINE MICROSCOPIC-ADD ON: RBC / HPF: NONE SEEN RBC/hpf (ref 0–5)

## 2015-07-30 LAB — PREPARE RBC (CROSSMATCH)

## 2015-07-30 LAB — URINALYSIS, ROUTINE W REFLEX MICROSCOPIC
Bilirubin Urine: NEGATIVE
GLUCOSE, UA: NEGATIVE mg/dL
Hgb urine dipstick: NEGATIVE
Ketones, ur: 15 mg/dL — AB
Nitrite: NEGATIVE
PH: 5.5 (ref 5.0–8.0)
Protein, ur: NEGATIVE mg/dL
SPECIFIC GRAVITY, URINE: 1.02 (ref 1.005–1.030)

## 2015-07-30 SURGERY — Surgical Case
Anesthesia: Spinal | Site: Abdomen | Laterality: Bilateral

## 2015-07-30 MED ORDER — DIBUCAINE 1 % RE OINT
1.0000 "application " | TOPICAL_OINTMENT | RECTAL | Status: DC | PRN
  Administered 2015-07-31: 1 via RECTAL
  Filled 2015-07-30: qty 28

## 2015-07-30 MED ORDER — NALOXONE HCL 2 MG/2ML IJ SOSY
1.0000 ug/kg/h | PREFILLED_SYRINGE | INTRAVENOUS | Status: DC | PRN
  Filled 2015-07-30: qty 2

## 2015-07-30 MED ORDER — SODIUM CHLORIDE 0.9% FLUSH: 3.0000 mL | INTRAVENOUS | Status: DC | PRN

## 2015-07-30 MED ORDER — FENTANYL CITRATE (PF) 100 MCG/2ML IJ SOLN
INTRAMUSCULAR | Status: AC
  Filled 2015-07-30: qty 2

## 2015-07-30 MED ORDER — BUPIVACAINE IN DEXTROSE 0.75-8.25 % IT SOLN
INTRATHECAL | Status: DC | PRN
  Administered 2015-07-30: 1.6 mL via INTRATHECAL

## 2015-07-30 MED ORDER — IBUPROFEN 600 MG PO TABS: 600.0000 mg | ORAL_TABLET | Freq: Four times a day (QID) | ORAL | Status: DC

## 2015-07-30 MED ORDER — DIPHENHYDRAMINE HCL 25 MG PO CAPS: 25.0000 mg | ORAL_CAPSULE | Freq: Four times a day (QID) | ORAL | Status: DC | PRN

## 2015-07-30 MED ORDER — NALBUPHINE HCL 10 MG/ML IJ SOLN
5.0000 mg | Freq: Once | INTRAMUSCULAR | Status: AC | PRN
  Administered 2015-07-30: 10 mg via SUBCUTANEOUS

## 2015-07-30 MED ORDER — PHENYLEPHRINE 8 MG IN D5W 100 ML (0.08MG/ML) PREMIX OPTIME
INJECTION | INTRAVENOUS | Status: AC
  Filled 2015-07-30: qty 100

## 2015-07-30 MED ORDER — LACTATED RINGERS IV SOLN: INTRAVENOUS | Status: DC

## 2015-07-30 MED ORDER — PHENYLEPHRINE HCL 10 MG/ML IJ SOLN
INTRAMUSCULAR | Status: DC | PRN
  Administered 2015-07-30 (×2): 40 ug via INTRAVENOUS
  Administered 2015-07-30: 120 ug via INTRAVENOUS

## 2015-07-30 MED ORDER — NALBUPHINE HCL 10 MG/ML IJ SOLN: 5.0000 mg | Freq: Once | INTRAMUSCULAR | Status: AC | PRN

## 2015-07-30 MED ORDER — SODIUM CHLORIDE 0.9 % IR SOLN
Status: DC | PRN
  Administered 2015-07-30: 1000 mL

## 2015-07-30 MED ORDER — PHENYLEPHRINE 8 MG IN D5W 100 ML (0.08MG/ML) PREMIX OPTIME
INJECTION | INTRAVENOUS | Status: DC | PRN
  Administered 2015-07-30: 60 ug/min via INTRAVENOUS

## 2015-07-30 MED ORDER — OXYCODONE HCL 5 MG PO TABS: 5.0000 mg | ORAL_TABLET | Freq: Once | ORAL | Status: DC

## 2015-07-30 MED ORDER — SCOPOLAMINE 1 MG/3DAYS TD PT72
MEDICATED_PATCH | TRANSDERMAL | Status: AC
  Administered 2015-07-30: 1.5 mg via TRANSDERMAL
  Filled 2015-07-30: qty 1

## 2015-07-30 MED ORDER — VITAMIN K1 1 MG/0.5ML IJ SOLN
INTRAMUSCULAR | Status: AC
  Filled 2015-07-30: qty 0.5

## 2015-07-30 MED ORDER — MEPERIDINE HCL 25 MG/ML IJ SOLN: 6.2500 mg | INTRAMUSCULAR | Status: DC | PRN

## 2015-07-30 MED ORDER — PHENYLEPHRINE 8 MG IN D5W 100 ML (0.08MG/ML) PREMIX OPTIME
INJECTION | INTRAVENOUS | Status: AC
  Filled 2015-07-30: qty 200

## 2015-07-30 MED ORDER — NALBUPHINE HCL 10 MG/ML IJ SOLN: 5.0000 mg | INTRAMUSCULAR | Status: DC | PRN

## 2015-07-30 MED ORDER — LACTATED RINGERS IV SOLN
INTRAVENOUS | Status: DC
  Administered 2015-07-30: 18:00:00 via INTRAVENOUS

## 2015-07-30 MED ORDER — LACTATED RINGERS IV SOLN
INTRAVENOUS | Status: DC | PRN
  Administered 2015-07-30 (×3): via INTRAVENOUS

## 2015-07-30 MED ORDER — SENNOSIDES-DOCUSATE SODIUM 8.6-50 MG PO TABS
2.0000 | ORAL_TABLET | ORAL | Status: DC
Start: 2015-07-31 — End: 2015-08-01
  Administered 2015-07-31 – 2015-08-01 (×2): 2 via ORAL
  Filled 2015-07-30 (×2): qty 2

## 2015-07-30 MED ORDER — NALOXONE HCL 0.4 MG/ML IJ SOLN: 0.4000 mg | INTRAMUSCULAR | Status: DC | PRN

## 2015-07-30 MED ORDER — ZOLPIDEM TARTRATE 5 MG PO TABS: 5.0000 mg | ORAL_TABLET | Freq: Every evening | ORAL | Status: DC | PRN

## 2015-07-30 MED ORDER — ERYTHROMYCIN 5 MG/GM OP OINT
TOPICAL_OINTMENT | OPHTHALMIC | Status: AC
  Filled 2015-07-30: qty 1

## 2015-07-30 MED ORDER — SIMETHICONE 80 MG PO CHEW
80.0000 mg | CHEWABLE_TABLET | Freq: Three times a day (TID) | ORAL | Status: DC
  Administered 2015-07-31 – 2015-08-01 (×4): 80 mg via ORAL
  Filled 2015-07-30 (×5): qty 1

## 2015-07-30 MED ORDER — OXYCODONE HCL 5 MG PO TABS
5.0000 mg | ORAL_TABLET | Freq: Once | ORAL | Status: AC | PRN
  Administered 2015-07-31: 5 mg via ORAL
  Filled 2015-07-30: qty 1

## 2015-07-30 MED ORDER — NALBUPHINE HCL 10 MG/ML IJ SOLN
INTRAMUSCULAR | Status: AC
  Filled 2015-07-30: qty 1

## 2015-07-30 MED ORDER — OXYTOCIN 40 UNITS IN LACTATED RINGERS INFUSION - SIMPLE MED
INTRAVENOUS | Status: DC | PRN
  Administered 2015-07-30: 40 [IU] via INTRAVENOUS

## 2015-07-30 MED ORDER — MENTHOL 3 MG MT LOZG: 1.0000 | LOZENGE | OROMUCOSAL | Status: DC | PRN

## 2015-07-30 MED ORDER — NALBUPHINE HCL 10 MG/ML IJ SOLN
5.0000 mg | INTRAMUSCULAR | Status: DC | PRN
  Administered 2015-07-30: 5 mg via INTRAVENOUS
  Filled 2015-07-30: qty 1

## 2015-07-30 MED ORDER — MORPHINE SULFATE (PF) 0.5 MG/ML IJ SOLN
INTRAMUSCULAR | Status: DC | PRN
  Administered 2015-07-30: .2 mg via EPIDURAL

## 2015-07-30 MED ORDER — ONDANSETRON HCL 4 MG/2ML IJ SOLN
INTRAMUSCULAR | Status: AC
  Filled 2015-07-30: qty 2

## 2015-07-30 MED ORDER — OXYCODONE HCL 5 MG/5ML PO SOLN
5.0000 mg | Freq: Once | ORAL | Status: AC | PRN
Start: 2015-07-30 — End: 2015-07-31

## 2015-07-30 MED ORDER — LACTATED RINGERS IV SOLN: 2.5000 [IU]/h | INTRAVENOUS | Status: AC

## 2015-07-30 MED ORDER — LACTATED RINGERS IV SOLN
Freq: Once | INTRAVENOUS | Status: AC
  Administered 2015-07-30: 07:00:00 via INTRAVENOUS

## 2015-07-30 MED ORDER — MEPERIDINE HCL 25 MG/ML IJ SOLN
6.2500 mg | INTRAMUSCULAR | Status: DC | PRN
Start: 2015-07-30 — End: 2015-07-30

## 2015-07-30 MED ORDER — HYDROMORPHONE HCL 1 MG/ML IJ SOLN: 0.2500 mg | INTRAMUSCULAR | Status: DC | PRN

## 2015-07-30 MED ORDER — SCOPOLAMINE 1 MG/3DAYS TD PT72: 1.0000 | MEDICATED_PATCH | Freq: Once | TRANSDERMAL | Status: DC

## 2015-07-30 MED ORDER — DIPHENHYDRAMINE HCL 25 MG PO CAPS: 25.0000 mg | ORAL_CAPSULE | ORAL | Status: DC | PRN

## 2015-07-30 MED ORDER — EPHEDRINE SULFATE 50 MG/ML IJ SOLN
INTRAMUSCULAR | Status: DC | PRN
  Administered 2015-07-30: 5 mg via INTRAVENOUS

## 2015-07-30 MED ORDER — NALBUPHINE HCL 10 MG/ML IJ SOLN: 5.0000 mg | Freq: Once | INTRAMUSCULAR | Status: DC | PRN

## 2015-07-30 MED ORDER — SCOPOLAMINE 1 MG/3DAYS TD PT72
1.0000 | MEDICATED_PATCH | Freq: Once | TRANSDERMAL | Status: DC
  Administered 2015-07-30: 1.5 mg via TRANSDERMAL

## 2015-07-30 MED ORDER — LANOLIN HYDROUS EX OINT: 1.0000 "application " | TOPICAL_OINTMENT | CUTANEOUS | Status: DC | PRN

## 2015-07-30 MED ORDER — PRENATAL MULTIVITAMIN CH
1.0000 | ORAL_TABLET | Freq: Every day | ORAL | Status: DC
  Administered 2015-07-31: 1 via ORAL
  Filled 2015-07-30: qty 1

## 2015-07-30 MED ORDER — NALOXONE HCL 2 MG/2ML IJ SOSY: 1.0000 ug/kg/h | PREFILLED_SYRINGE | INTRAVENOUS | Status: DC | PRN

## 2015-07-30 MED ORDER — SIMETHICONE 80 MG PO CHEW: 80.0000 mg | CHEWABLE_TABLET | ORAL | Status: DC | PRN

## 2015-07-30 MED ORDER — MORPHINE SULFATE (PF) 0.5 MG/ML IJ SOLN
INTRAMUSCULAR | Status: AC
  Filled 2015-07-30: qty 10

## 2015-07-30 MED ORDER — ACETAMINOPHEN 500 MG PO TABS
1000.0000 mg | ORAL_TABLET | Freq: Four times a day (QID) | ORAL | Status: AC
  Administered 2015-07-30 – 2015-07-31 (×4): 1000 mg via ORAL
  Filled 2015-07-30 (×4): qty 2

## 2015-07-30 MED ORDER — SIMETHICONE 80 MG PO CHEW
80.0000 mg | CHEWABLE_TABLET | ORAL | Status: DC
Start: 2015-07-31 — End: 2015-08-01
  Administered 2015-07-31 – 2015-08-01 (×2): 80 mg via ORAL
  Filled 2015-07-30 (×2): qty 1

## 2015-07-30 MED ORDER — ACETAMINOPHEN 325 MG PO TABS
650.0000 mg | ORAL_TABLET | ORAL | Status: DC | PRN
  Administered 2015-07-30: 650 mg via ORAL
  Filled 2015-07-30: qty 2

## 2015-07-30 MED ORDER — OXYTOCIN 10 UNIT/ML IJ SOLN
INTRAMUSCULAR | Status: AC
  Filled 2015-07-30: qty 4

## 2015-07-30 MED ORDER — HYDROMORPHONE HCL 1 MG/ML IJ SOLN
0.5000 mg | Freq: Once | INTRAMUSCULAR | Status: AC
  Administered 2015-07-30: 0.5 mg via INTRAVENOUS
  Filled 2015-07-30: qty 1

## 2015-07-30 MED ORDER — ONDANSETRON HCL 4 MG/2ML IJ SOLN: 4.0000 mg | Freq: Three times a day (TID) | INTRAMUSCULAR | Status: DC | PRN

## 2015-07-30 MED ORDER — LACTATED RINGERS IV SOLN
INTRAVENOUS | Status: DC | PRN
  Administered 2015-07-30: 08:00:00 via INTRAVENOUS

## 2015-07-30 MED ORDER — EPHEDRINE 5 MG/ML INJ
INTRAVENOUS | Status: AC
  Filled 2015-07-30: qty 10

## 2015-07-30 MED ORDER — DIPHENHYDRAMINE HCL 50 MG/ML IJ SOLN: 12.5000 mg | INTRAMUSCULAR | Status: DC | PRN

## 2015-07-30 MED ORDER — FENTANYL CITRATE (PF) 100 MCG/2ML IJ SOLN
INTRAMUSCULAR | Status: DC | PRN
  Administered 2015-07-30: 20 ug via INTRAVENOUS

## 2015-07-30 MED ORDER — TETANUS-DIPHTH-ACELL PERTUSSIS 5-2.5-18.5 LF-MCG/0.5 IM SUSP: 0.5000 mL | Freq: Once | INTRAMUSCULAR | Status: DC

## 2015-07-30 MED ORDER — WITCH HAZEL-GLYCERIN EX PADS
1.0000 "application " | MEDICATED_PAD | CUTANEOUS | Status: DC | PRN
  Administered 2015-07-31: 1 via TOPICAL

## 2015-07-30 SURGICAL SUPPLY — 44 items
APL SKNCLS STERI-STRIP NONHPOA (GAUZE/BANDAGES/DRESSINGS) ×1
BENZOIN TINCTURE PRP APPL 2/3 (GAUZE/BANDAGES/DRESSINGS) ×3 IMPLANT
CHLORAPREP W/TINT 26ML (MISCELLANEOUS) ×3 IMPLANT
CLAMP CORD UMBIL (MISCELLANEOUS) IMPLANT
CLIP FILSHIE TUBAL LIGA STRL (Clip) ×6 IMPLANT
CLOSURE STERI STRIP 1/2 X4 (GAUZE/BANDAGES/DRESSINGS) ×6 IMPLANT
CLOSURE WOUND 1/2 X4 (GAUZE/BANDAGES/DRESSINGS) ×1
CLOTH BEACON ORANGE TIMEOUT ST (SAFETY) ×3 IMPLANT
DRAIN JACKSON PRT FLT 7MM (DRAIN) ×3 IMPLANT
DRSG OPSITE POSTOP 4X10 (GAUZE/BANDAGES/DRESSINGS) ×3 IMPLANT
DRSG OPSITE POSTOP 4X12 (GAUZE/BANDAGES/DRESSINGS) ×3 IMPLANT
ELECT REM PT RETURN 9FT ADLT (ELECTROSURGICAL) ×3
ELECTRODE REM PT RTRN 9FT ADLT (ELECTROSURGICAL) ×1 IMPLANT
EVACUATOR SILICONE 100CC (DRAIN) ×3 IMPLANT
EXTRACTOR VACUUM KIWI (MISCELLANEOUS) IMPLANT
GLOVE BIO SURGEON ST LM GN SZ9 (GLOVE) ×3 IMPLANT
GLOVE BIOGEL PI IND STRL 7.0 (GLOVE) ×2 IMPLANT
GLOVE BIOGEL PI IND STRL 9 (GLOVE) ×1 IMPLANT
GLOVE BIOGEL PI INDICATOR 7.0 (GLOVE) ×4
GLOVE BIOGEL PI INDICATOR 9 (GLOVE) ×2
GOWN STRL REUS W/TWL 2XL LVL3 (GOWN DISPOSABLE) ×3 IMPLANT
GOWN STRL REUS W/TWL LRG LVL3 (GOWN DISPOSABLE) ×6 IMPLANT
NEEDLE HYPO 25X5/8 SAFETYGLIDE (NEEDLE) IMPLANT
NS IRRIG 1000ML POUR BTL (IV SOLUTION) ×3 IMPLANT
PACK C SECTION WH (CUSTOM PROCEDURE TRAY) ×3 IMPLANT
PAD ABD 8X7 1/2 STERILE (GAUZE/BANDAGES/DRESSINGS) ×3 IMPLANT
PAD OB MATERNITY 4.3X12.25 (PERSONAL CARE ITEMS) ×3 IMPLANT
PENCIL SMOKE EVAC W/HOLSTER (ELECTROSURGICAL) ×3 IMPLANT
RTRCTR C-SECT PINK 25CM LRG (MISCELLANEOUS) IMPLANT
RTRCTR C-SECT PINK 34CM XLRG (MISCELLANEOUS) IMPLANT
SPONGE GAUZE 4X4 12PLY STER LF (GAUZE/BANDAGES/DRESSINGS) ×9 IMPLANT
STRIP CLOSURE SKIN 1/2X4 (GAUZE/BANDAGES/DRESSINGS) ×2 IMPLANT
SUT ETHILON 3 0 PS 1 (SUTURE) ×3 IMPLANT
SUT MNCRL 0 VIOLET CTX 36 (SUTURE) ×2 IMPLANT
SUT MONOCRYL 0 CTX 36 (SUTURE) ×4
SUT VIC AB 0 CT1 27 (SUTURE) ×3
SUT VIC AB 0 CT1 27XBRD ANBCTR (SUTURE) ×1 IMPLANT
SUT VIC AB 2-0 CT1 (SUTURE) ×6 IMPLANT
SUT VIC AB 2-0 CT1 27 (SUTURE) ×6
SUT VIC AB 2-0 CT1 TAPERPNT 27 (SUTURE) ×2 IMPLANT
SUT VIC AB 4-0 KS 27 (SUTURE) ×3 IMPLANT
SYR BULB IRRIGATION 50ML (SYRINGE) ×3 IMPLANT
TOWEL OR 17X24 6PK STRL BLUE (TOWEL DISPOSABLE) ×3 IMPLANT
TRAY FOLEY CATH SILVER 14FR (SET/KITS/TRAYS/PACK) ×3 IMPLANT

## 2015-07-30 NOTE — Addendum Note (Signed)
Addendum  created 07/30/15 1409 by Jonna Munro, CRNA   Modules edited: Clinical Notes   Clinical Notes:  File: [id number]

## 2015-07-30 NOTE — Consult Note (Signed)
Neonatology Note:   Attendance at C-section:    I was asked by Dr. Glo Herring to attend this repeat C/S at term. The mother is a G3P1A1 A pos, GBS neg with a history of gastric bypass surgery and GDM with previous pregnancy (reportedly not with this pregnancy). ROM at delivery, fluid clear. Delayed cord clamping was done. Infant vigorous with good spontaneous cry and tone. Needed no suctioning. Ap 9/9. Lungs clear to ausc in DR. To CN to care of Pediatrician.   Real Cons, MD

## 2015-07-30 NOTE — Lactation Note (Signed)
This note was copied from a baby's chart. Lactation Consultation Note; Initial visit  Baby now 43 hours old. Mom reports baby nursed well in recovery. Attempted to latch- she took a few sucks then went off to sleep.Skin to skin with mom. Mom reports she had gastric bypass surgery before her first baby and she had a good milk supply with him. Reviewed feeding cues and encouraged to feed whenever she sees the, BF brochure given with resources for support after DC. No questions at present. To call for assist prn  Patient Name: Kristen Dunn [id number] Date: 07/30/2015 Reason for consult: Initial assessment   Maternal Data Formula Feeding for Exclusion: No Does the patient have breastfeeding experience prior to this delivery?: Yes  Feeding   LATCH Score/Interventions Latch: Too sleepy or reluctant, no latch achieved, no sucking elicited. (few sucks) Intervention(s): Adjust position;Assist with latch;Breast massage  Audible Swallowing: None  Type of Nipple: Everted at rest and after stimulation  Comfort (Breast/Nipple): Soft / non-tender     Hold (Positioning): Assistance needed to correctly position infant at breast and maintain latch.  LATCH Score: 5  Lactation Tools Discussed/Used     Consult Status Consult Status: Follow-up Date: 07/31/15 Follow-up type: In-patient    Truddie Crumble 07/30/2015, 12:38 PM

## 2015-07-30 NOTE — Addendum Note (Signed)
Addendum  created 07/30/15 1817 by Franne Grip, MD   Modules edited: PRL Based Order Sets

## 2015-07-30 NOTE — Progress Notes (Signed)
Rubella index 1.17 .  Status immune

## 2015-07-30 NOTE — H&P (View-Only) (Signed)
Kristen Dunn is a 40 y.o. female presenting for Repeat cesarean section at 39wks, tubal sterilization by filshie clip placement, and wide excision of cicatrix. History She is a G3P1011 at 39.0 wk who is not interested in [facility], and who desires permanent sterilization by Filshie Clip Placement, and who has significant skin laxity s/p Gastric bypass, who desires excision of a portion of the redundant skin to improve skin care and wound care. She is well aware that there are increased risk of wound complication such as hematoma or infection based on the increased size of the incision, but also reduced care issues due to reduced moisture trapping in redundant skin folds. OB History    Gravida Para Term Preterm AB TAB SAB Ectopic Multiple Living   3 1 1  0 1 0 1 0 0 1     Past Medical History  Diagnosis Date  . Asthma   . Anemia   . AMA (advanced maternal age) multigravida 82+   . Gestational diabetes     glyburide  . PONV (postoperative nausea and vomiting)   . Hx of migraines 10/26/2012  . Other and unspecified ovarian cyst 03/30/2013   Past Surgical History  Procedure Laterality Date  . Gastric bypass 2008    . Tonsillectomy    . Cesarean section  01/13/2012    Procedure: CESAREAN SECTION;  Surgeon: Mora Bellman, MD;  Location: [facility];  Service: Obstetrics;  Laterality: N/A;  . Incisional hernia repair  03/31/2012    Procedure: LAPAROSCOPIC INCISIONAL HERNIA;  Surgeon: Edward Jolly, MD;  Location: [facility];  Service: General;  Laterality: N/A;  LAPAROSCOPY REPAIR INTERNAL HERNIA  petersons defect  . Laparoscopic gastric banding with hiatal hernia repair N/A 03/14/2013    Procedure: DIAGNOSTIC LAPAROSCOPY  WITH  INTERNAL HERNIA  REDUCTION WITH CLOSURE;  Surgeon: Shann Medal, MD;  Location: [facility];  Service: General;  Laterality: N/A;   Family History: family history includes Aneurysm in her father; Asthma in her mother and sister; COPD in her mother; Cancer in her maternal aunt;  Hypertension in her father; Stroke in her maternal grandmother; Thyroid disease in her maternal grandmother; Von Willebrand disease in her cousin. Social History:  reports that she has never smoked. She has never used smokeless tobacco. She reports that she does not drink alcohol or use illicit drugs.   Prenatal Transfer Tool  Maternal Diabetes: No Genetic Screening: Normal Maternal Ultrasounds/Referrals: Normal Fetal Ultrasounds or other Referrals:  None Maternal Substance Abuse:  No Significant Maternal Medications:  None Significant Maternal Lab Results:  Lab values include: Group B Strep negative Other Comments:  None  Review of Systems  Constitutional: Negative.   Gastrointestinal: Negative.   Genitourinary: Negative.   Neurological: Negative.   Endo/Heme/Allergies: Negative.   All other systems reviewed and are negative.     Last menstrual period 10/30/2014, unknown if currently breastfeeding.  LMP 10/30/2014   Exam Physical Exam  Vitals reviewed. Constitutional: She is oriented to person, place, and time. She appears well-developed and well-nourished.  Cardiovascular: Normal rate.   Respiratory: Effort normal.  GI: Soft.  Gravid uterus c/w dates.  Prior lower abd transverse scar. Generalized abdominal laxity with skin creasing that does not extend laterally beyond the iliac crests. No significant pannus.  Genitourinary: Vagina normal.  Musculoskeletal: Normal range of motion.  Neurological: She is alert and oriented to person, place, and time.  Skin: Skin is warm and dry. No erythema.  Psychiatric: She has a normal mood and affect.  Her behavior is normal. Judgment and thought content normal.    Prenatal labs: ABO, Rh: A/Positive/-- (09/12 1611) Antibody: Negative (01/17 0911) Rubella: 1.17 (09/12 1611) RPR: Non Reactive (01/17 0911)  HBsAg: Negative (09/12 1611)  HIV: Non Reactive (01/17 0911)  GBS:   neg   Assessment/Plan: Prenancy 39 wks, prior cesarean  declining tolac Desire for permanent sterilization Abdominal laxity s/p gastric bypass and weight loss. PLAN Repeat cesarean , bilateral sterilization by filshie clips Wide excision of old cesarean scar (cicatrix) Risks, rationale reviewed with patient at prenatal visits.   Ephrem Carrick V 07/24/2015, 2:36 PM

## 2015-07-30 NOTE — Brief Op Note (Signed)
07/30/2015  8:51 AM  PATIENT:  Kristen Dunn  40 y.o. female  PRE-OPERATIVE DIAGNOSIS:  PEGNANCY 39 WEEK PREVIOUS CESAREAN SECTION, BILATERAL TUBAL STERILIZATION BY FILSHIE CLIPS, WIDE EXCISION OF CICATRIX  POST-OPERATIVE DIAGNOSIS:  PEGNANCY 39 WEEK PREVIOUS CESAREAN SECTION, BILATERAL TUBAL STERILIZATION BY FILSHIE CLIPS, WIDE EXCISION OF CICATRIX  PROCEDURE:  Procedure(s): REPEAT CESAREAN SECTION WITH BILATERAL TUBAL LIGATION, WIDE EXCISION OF CICHEIX (Bilateral)  SURGEON:  Surgeon(s) and Role:    * Caren Macadam, MD - Assisting    * Jonnie Kind, MD - Primary  PHYSICIAN ASSISTANT:   ASSISTANTS: none   ANESTHESIA:   spinal  EBL:  Total I/O In: 9 [I.V.:2600] Out: 700 [Urine:100; Blood:600]  BLOOD ADMINISTERED:none  DRAINS: (1) Jackson-Pratt drain(s) with closed bulb suction in the  Subcutaneous space and Urinary Catheter (Foley)   LOCAL MEDICATIONS USED:  NONE  SPECIMEN:  Source of Specimen:  Placenta to labor and delivery  DISPOSITION OF SPECIMEN:  Labor and delivery  COUNTS:  YES  TOURNIQUET:  * No tourniquets in log *  DICTATION: .Dragon Dictation  PLAN OF CARE: Admit to inpatient   PATIENT DISPOSITION:  PACU - hemodynamically stable.   Delay start of Pharmacological VTE agent (>24hrs) due to surgical blood loss or risk of bleeding: not applicable

## 2015-07-30 NOTE — Interval H&P Note (Signed)
History and Physical Interval Note:  07/30/2015 7:12 AM  Kristen Dunn  has presented today for surgery, with the diagnosis of PEGNANCY 39 WEEK PREVIOUS CESAREAN SECTION  The various methods of treatment have been discussed with the patient and family. After consideration of risks, benefits and other options for treatment, the patient has consented to  Procedure(s): Olla, WIDE EXCISION OF CICHEIX (Bilateral) as a surgical intervention .  The patient's history has been reviewed, patient examined, no change in status, stable for surgery.  I have reviewed the patient's chart and labs.  Questions were answered to the patient's satisfaction.     Jonnie Kind

## 2015-07-30 NOTE — Transfer of Care (Signed)
Immediate Anesthesia Transfer of Care Note  Patient: Kristen Dunn  Procedure(s) Performed: Procedure(s): REPEAT CESAREAN SECTION WITH BILATERAL TUBAL LIGATION, WIDE EXCISION OF CICHEIX (Bilateral)  Patient Location: PACU  Anesthesia Type:Spinal  Level of Consciousness: awake, alert  and oriented  Airway & Oxygen Therapy: Patient Spontanous Breathing  Post-op Assessment: Report given to RN and Post -op Vital signs reviewed and stable  Post vital signs: Reviewed and stable  Last Vitals:  Filed Vitals:   07/30/15 0606  BP: 119/86  Pulse: 90  Temp: 36.5 C  Resp: 18    Complications: No apparent anesthesia complications

## 2015-07-30 NOTE — Anesthesia Postprocedure Evaluation (Signed)
Anesthesia Post Note  Patient: Kristen Dunn  Procedure(s) Performed: Procedure(s) (LRB): REPEAT CESAREAN SECTION WITH BILATERAL TUBAL LIGATION, WIDE EXCISION OF CICHEIX (Bilateral)  Patient location during evaluation: Mother Baby Anesthesia Type: Spinal Level of consciousness: awake and alert and oriented Pain management: satisfactory to patient Vital Signs Assessment: post-procedure vital signs reviewed and stable Respiratory status: spontaneous breathing and nonlabored ventilation Cardiovascular status: stable Postop Assessment: no headache, no backache, patient able to bend at knees, no signs of nausea or vomiting and adequate PO intake Anesthetic complications: no    Last Vitals:  Filed Vitals:   07/30/15 1120 07/30/15 1220  BP: 123/79 129/78  Pulse: 59 67  Temp: 36.4 C   Resp: 20 20    Last Pain:  Filed Vitals:   07/30/15 1259  PainSc: 0-No pain                 Gizella [place]

## 2015-07-30 NOTE — Anesthesia Postprocedure Evaluation (Signed)
Anesthesia Post Note  Patient: Kristen Dunn  Procedure(s) Performed: Procedure(s) (LRB): REPEAT CESAREAN SECTION WITH BILATERAL TUBAL LIGATION, WIDE EXCISION OF CICHEIX (Bilateral)  Patient location during evaluation: PACU Anesthesia Type: Spinal Level of consciousness: oriented and awake and alert Pain management: pain level controlled Vital Signs Assessment: post-procedure vital signs reviewed and stable Respiratory status: spontaneous breathing, respiratory function stable and patient connected to nasal cannula oxygen Cardiovascular status: blood pressure returned to baseline and stable Postop Assessment: no headache, no backache, spinal receding and patient able to bend at knees Anesthetic complications: no    Last Vitals:  Filed Vitals:   07/30/15 0945 07/30/15 1000  BP: 120/82 118/74  Pulse: 79 58  Temp:    Resp: 17 12    Last Pain:  Filed Vitals:   07/30/15 1003  PainSc: 0-No pain                 Graviel Payeur J

## 2015-07-30 NOTE — Addendum Note (Signed)
Addendum  created 07/30/15 2054 by Lauretta Grill, MD   Modules edited: Orders, PRL Based Order Sets

## 2015-07-30 NOTE — Op Note (Signed)
07/30/2015  8:51 AM  PATIENT:  Kristen Dunn  40 y.o. female  PRE-OPERATIVE DIAGNOSIS:  PEGNANCY 39 WEEK PREVIOUS CESAREAN SECTION, BILATERAL TUBAL STERILIZATION BY FILSHIE CLIPS, WIDE EXCISION OF CICATRIX  POST-OPERATIVE DIAGNOSIS:  PEGNANCY 39 WEEK PREVIOUS CESAREAN SECTION, BILATERAL TUBAL STERILIZATION BY FILSHIE CLIPS, WIDE EXCISION OF CICATRIX  PROCEDURE:  Procedure(s): REPEAT CESAREAN SECTION WITH BILATERAL TUBAL LIGATION, WIDE EXCISION OF CICatrix (Bilateral)  SURGEON:  Surgeon(s) and Role:    * Caren Macadam, MD - Assisting    * Jonnie Kind, MD - Primary  PHYSICIAN ASSISTANT:   ASSISTANTS: none   ANESTHESIA:   spinal  EBL:  Total I/O In: 81 [I.V.:2600] Out: 700 [Urine:100; Blood:600]  BLOOD ADMINISTERED:none  DRAINS: (1) Jackson-Pratt drain(s) with closed bulb suction in the  Subcutaneous space and Urinary Catheter (Foley)   LOCAL MEDICATIONS USED:  NONE  SPECIMEN:  Source of Specimen:  Placenta to labor and delivery  DISPOSITION OF SPECIMEN:  Labor and delivery  COUNTS:  YES  TOURNIQUET:  * No tourniquets in log *  DICTATION: .Dragon Dictation  PLAN OF CARE: Admit to inpatient   PATIENT DISPOSITION:  PACU - hemodynamically stable.   Delay start of Pharmacological VTE agent (>24hrs) due to surgical blood loss or risk of bleeding: not applicable Indications 40 year old female getting repeat cesarean section and tubal ligation, with abdominal laxity status post weight loss from gastric bypass Details of procedure: Patient was taken to the operating room prepped and draped for lower abdominal surgery with Foley catheter in place after spinal anesthesia. Timeout was conducted. Gentamicin and Flagyl were administered. Transverse lower abdominal incision was marked on the inferior side of the demarcated skin ellipse to be removed, a 30 cm wide ellipse with 10 cm vertical width. The fascia was opened in the middle transversely in standard method of  Pfannenstiel and peritoneal cavity opened in the midline easily. There were no intra-abdominal adhesions encountered. Alexis wound retractor was positioned, bladder flap developed lower uterine segment and transverse incision made in into the cavity, opened transversely and vertically manually, and the fetal vertex rotated out into the incision without difficulty, and the baby cried vigorously. At 1 minute the cord was clamped and the infant passed to the waiting pediatrician. See their notes for further details. Placenta delivered and Cred uterine massage. Response to Pitocin assisted. There was good hemostasis. The uterus was swabbed out there were no membrane remnants encountered. Closure the uterus was 2 layer running locking first layer of 0 Monocryl followed by a second layer of continuous running 0 Monocryl. Good hemostasis and tissue approximation was achieved.  Sterilization: The abdomen was irrigated with saline solution, then the fallopian tubes and ovaries identified. There was a 4 cm simple cyst on the left ovary considered benign. Fallopian tubes could be identified to their fimbriated end whereupon Filshie clip was placed on the loop into distance from the cornu to the fimbria with proper approximation and coverage of the fallopian tube confirmed visually. Peritoneum was closed with running 2-0 Vicryl. The fascia was slightly in the midline skin edges approximation, then continuous running 0 Vicryl close the fascia. Wide excision of cicatrix: The upper skin line was then performed excising the 10 cm wide ellipse of 7 redundant skin and subcutaneous fatty tissue with careful attention to hemostasis with point cautery as necessary on coagulations setting. Approximation of subcutaneous fatty tissue using horizontal mattress sutures of 2-0 Vicryl, 2 layers was then performed and a JP 7 mm drain placed beneath  the sutures, next to the fascia. This was allowed to exit through a stab incision superior  to the left side of the incision end. Subcuticular 4-0 Vicryl was used to close the skin incision with good tissue edge approximation achieved. The subcutaneous tissues were irrigated prior to this closure. J-P drain was sewn in place and suctioned with bulb suction initiated. EBL was 600 cc for the case. Condition to recovery  good.

## 2015-07-30 NOTE — Addendum Note (Signed)
Addendum  created 07/30/15 2041 by Lauretta Grill, MD   Modules edited: Orders, PRL Based Order Sets

## 2015-07-31 ENCOUNTER — Encounter (HOSPITAL_COMMUNITY): Payer: Self-pay | Admitting: Obstetrics and Gynecology

## 2015-07-31 LAB — CBC
HCT: 32.8 % — ABNORMAL LOW (ref 36.0–46.0)
Hemoglobin: 10.5 g/dL — ABNORMAL LOW (ref 12.0–15.0)
MCH: 28.5 pg (ref 26.0–34.0)
MCHC: 32 g/dL (ref 30.0–36.0)
MCV: 88.9 fL (ref 78.0–100.0)
Platelets: 330 10*3/uL (ref 150–400)
RBC: 3.69 MIL/uL — ABNORMAL LOW (ref 3.87–5.11)
RDW: 14 % (ref 11.5–15.5)
WBC: 13.5 10*3/uL — ABNORMAL HIGH (ref 4.0–10.5)

## 2015-07-31 LAB — TYPE AND SCREEN
ABO/RH(D): A POS
Antibody Screen: NEGATIVE
UNIT DIVISION: 0
Unit division: 0

## 2015-07-31 LAB — BIRTH TISSUE RECOVERY COLLECTION (PLACENTA DONATION)

## 2015-07-31 MED ORDER — OXYCODONE HCL 5 MG PO TABS
5.0000 mg | ORAL_TABLET | Freq: Four times a day (QID) | ORAL | Status: DC | PRN
  Filled 2015-07-31: qty 1

## 2015-07-31 MED ORDER — OXYCODONE HCL 5 MG PO TABS
5.0000 mg | ORAL_TABLET | ORAL | Status: DC | PRN
  Administered 2015-07-31 – 2015-08-01 (×6): 10 mg via ORAL
  Filled 2015-07-31 (×3): qty 2
  Filled 2015-07-31: qty 1
  Filled 2015-07-31 (×2): qty 2

## 2015-07-31 NOTE — Progress Notes (Signed)
Post Op Day 1 Subjective:  Kristen Dunn is a 40 y.o. [id number] [redacted]w[redacted]d s/p rLTCS and BTL.  No acute events overnight.  Pt denies problems with ambulating, voiding or po intake.  She denies nausea or vomiting.  Pain is poorly controlled.  She has had flatus. She has not had bowel movement.  Lochia Moderate.  Plan for birth control is bilateral tubal ligation.  Method of Feeding: Breast  Objective: Blood pressure 109/66, pulse 60, temperature 98.1 F (36.7 C), temperature source Oral, resp. rate 18, height 5\' 5"  (1.651 m), weight 207 lb (93.895 kg), last menstrual period 10/30/2014, SpO2 98 %, unknown if currently breastfeeding.  Physical Exam:  General: alert, cooperative and no distress Lochia:normal flow Chest: CTAB, normal WOB on room air Heart: RRR no m/r/g Abdomen: +BS, soft, nontender Incision w/out evidence of significant drainage.  Drain in place. Uterine Fundus: firm DVT Evaluation: No evidence of DVT seen on physical exam. Extremities: trace/+1 edema   Recent Labs  07/31/15 0524  HGB 10.5*  HCT 32.8*    Assessment/Plan:  ASSESSMENT: Kristen Dunn is a 40 y.o. [id number] 102w0d s/p rLTCS and BTL  Plan for discharge tomorrow and Breastfeeding.  Oxy IR ordered for pain.   LOS: 1 day   Ronnie Doss, DO 07/31/2015, 8:45 AM   OB fellow attestation:  I have seen and examined this patient; I agree with above documentation in the resident's note.   Caren Macadam, MD 8:47 AM

## 2015-07-31 NOTE — Lactation Note (Signed)
This note was copied from a baby's chart. Lactation Consultation Note  Patient Name: Kristen Dunn [id number] Date: 07/31/2015 Reason for consult: Initial assessment Baby at 62 hr of life and mom reports bf is getting better. She stated baby has been latching to the tip of the nipple and she is just in the last 2-3 feedings gotten baby deeper on the breast. She denies breast or nipple pain despite the poor latching. Based on mom's Hx and 7% wt loss offered DEBP and ways to supplement the baby. Mom declined DEBP and requested a Harmony. She stated that MD was not worried about wt loss, this happened with her son, and if her milk does not come in soon she will offer formula at the breast with an SNS or oral syring. Discussed baby behavior, feeding frequency, pumping, baby belly size, voids, wt loss, breast changes, and nipple care. She is aware of OP services and support group.     Maternal Data    Feeding Length of feed: 30 min  LATCH Score/Interventions Latch: Repeated attempts needed to sustain latch, nipple held in mouth throughout feeding, stimulation needed to elicit sucking reflex.  Audible Swallowing: Spontaneous and intermittent  Type of Nipple: Everted at rest and after stimulation  Comfort (Breast/Nipple): Filling, red/small blisters or bruises, mild/mod discomfort  Problem noted: Mild/Moderate discomfort;Cracked, bleeding, blisters, bruises  Hold (Positioning): Assistance needed to correctly position infant at breast and maintain latch.  LATCH Score: 7  Lactation Tools Discussed/Used Pump Review: Setup, frequency, and cleaning;Milk Storage Initiated by:: ES Date initiated:: 07/31/15   Consult Status Consult Status: Follow-up Date: 08/01/15 Follow-up type: In-patient    Denzil Hughes 07/31/2015, 7:06 PM

## 2015-08-01 ENCOUNTER — Telehealth: Payer: Self-pay | Admitting: Obstetrics and Gynecology

## 2015-08-01 MED ORDER — OXYCODONE HCL 5 MG PO TABS: 5.0000 mg | ORAL_TABLET | ORAL | Status: DC | PRN

## 2015-08-01 NOTE — Lactation Note (Signed)
This note was copied from a baby's chart. Lactation Consultation Note Mom having painful latching. Nipples very red and very tender, mom almost in tears when baby latches. Baby hungry and aggressive at breast. Mom has great everted nipples. Hand expressed colostrum. Assessed baby's suck, has high palate, upper lip labial frenulum, and tongue cups when cries. Can protrude past gum line some. Can see frenulum when cries. Fitted mom #24 NS. Moms breast slightly soft and needs firmed w/"C" hold. Placed baby in football hold STS assisted in latching baby. Baby has small mouth and didn't want to open wide enough for latch, also hungry so mad. W/gloved finger gave formula w/gloved finger and syring to calm baby down. Finally baby latched, needed chin tug frequently when starting to latch. Needs assistance to do this. After flange widened and lips adjusted mom stated more tolerable and able to BF. Baby had 11% weight loss. Baby cluster fed last night per mom, I don't feel that baby was transfering milk from breast. Comfort gels given as well after hand expressed colostrum. Reported to RN of plan of care. Patient Name: Kristen Dunn [id number] Date: 08/01/2015 Reason for consult: Follow-up assessment;Breast/nipple pain   Maternal Data    Feeding Feeding Type: Formula Length of feed: 20 min  LATCH Score/Interventions Latch: Repeated attempts needed to sustain latch, nipple held in mouth throughout feeding, stimulation needed to elicit sucking reflex. Intervention(s): Skin to skin;Teach feeding cues;Waking techniques Intervention(s): Adjust position;Assist with latch;Breast massage;Breast compression  Audible Swallowing: A few with stimulation Intervention(s): Hand expression Intervention(s): Hand expression;Alternate breast massage  Type of Nipple: Everted at rest and after stimulation  Comfort (Breast/Nipple): Filling, red/small blisters or bruises, mild/mod discomfort  Problem noted: Severe  discomfort Interventions  (Cracked/bleeding/bruising/blister): Expressed breast milk to nipple  Hold (Positioning): Assistance needed to correctly position infant at breast and maintain latch. Intervention(s): Skin to skin;Position options;Support Pillows;Breastfeeding basics reviewed  LATCH Score: 6  Lactation Tools Discussed/Used Tools: Nipple Shields;Comfort gels Nipple shield size: 24   Consult Status Consult Status: Follow-up Date: 08/01/15 Follow-up type: In-patient    Theodoro Kalata 08/01/2015, 5:29 AM

## 2015-08-01 NOTE — Discharge Summary (Signed)
OB Discharge Summary  Patient Name: Kristen Dunn DOB: April 29, 1975 MRN: [id number]  Date of admission: 07/30/2015 Delivering MD: Jonnie Kind   Date of discharge: 08/01/2015  Admitting diagnosis: PERG 12 WEEK REPEAT C S  Intrauterine pregnancy: [redacted]w[redacted]d     Secondary diagnosis:Active Problems:   Status post repeat low transverse cesarean section  Additional problems: rLTCS with wide excision, JP drain placed    Discharge diagnosis: Term Pregnancy Delivered                                                                     Post partum procedures:postpartum tubal ligation  Augmentation: none  Complications: None  Hospital course:  Sceduled C/S   40 y.o. yo [id number] at [redacted]w[redacted]d was admitted to the hospital 07/30/2015 for scheduled cesarean section with the following indication:Elective Repeat.  Membrane Rupture Time/Date: 7:52 AM ,07/30/2015   Patient delivered a Viable infant.07/30/2015  Details of operation can be found in separate operative note.  Pateint had an uncomplicated postpartum course.  She is ambulating, tolerating a regular diet, passing flatus, and urinating well. Patient is discharged home in stable condition on  08/01/2015          Physical exam  Filed Vitals:   07/31/15 0445 07/31/15 0855 07/31/15 2030 08/01/15 0545  BP: 109/66 115/82 120/79 102/67  Pulse: 60 75 62 75  Temp: 98.1 F (36.7 C) 97.8 F (36.6 C) 97.6 F (36.4 C) 98.1 F (36.7 C)  TempSrc: Oral Axillary Axillary Oral  Resp: 18 18 18 18   Height:      Weight:      SpO2: 98% 97%     General: alert, cooperative and no distress Lochia: appropriate Uterine Fundus: firm Incision: Healing well with no significant drainage, Dressing is clean, dry, and intact DVT Evaluation: No evidence of DVT seen on physical exam. Labs: Lab Results  Component Value Date   WBC 13.5* 07/31/2015   HGB 10.5* 07/31/2015   HCT 32.8* 07/31/2015   MCV 88.9 07/31/2015   PLT 330 07/31/2015   CMP Latest Ref Rng  04/28/2015  Glucose 65 - 99 mg/dL 91  BUN 6 - 20 mg/dL 4(L)  Creatinine 0.44 - 1.00 mg/dL 0.40(L)  Sodium 135 - 145 mmol/L 138  Potassium 3.5 - 5.1 mmol/L 3.6  Chloride 101 - 111 mmol/L 106  CO2 22 - 32 mmol/L -  Calcium 8.9 - 10.3 mg/dL -  Total Protein 6.5 - 8.1 g/dL -  Total Bilirubin 0.3 - 1.2 mg/dL -  Alkaline Phos 38 - 126 U/L -  AST 15 - 41 U/L -  ALT 14 - 54 U/L -    Discharge instruction: per After Visit Summary and "Baby and Me Booklet".  After Visit Meds:    Medication List    STOP taking these medications        oxyCODONE-acetaminophen 5-325 MG tablet  Commonly known as:  PERCOCET/ROXICET      TAKE these medications        acetaminophen 325 MG tablet  Commonly known as:  TYLENOL  Take 650 mg by mouth every 6 (six) hours as needed for moderate pain.     albuterol 108 (90 Base) MCG/ACT inhaler  Commonly known as:  PROVENTIL HFA;VENTOLIN HFA  Inhale 1 puff into the lungs every 6 (six) hours as needed for wheezing or shortness of breath. Reported on 06/13/2015     diphenhydrAMINE 25 MG tablet  Commonly known as:  BENADRYL  Take 25 mg by mouth at bedtime as needed for itching, allergies or sleep.     ferrous sulfate 325 (65 FE) MG tablet  Take 325 mg by mouth daily with breakfast.     oxyCODONE 5 MG immediate release tablet  Commonly known as:  Oxy IR/ROXICODONE  Take 1-2 tablets (5-10 mg total) by mouth every 4 (four) hours as needed for severe pain.     PRENATAL VITAMIN PO  Take 1 tablet by mouth daily.        Diet: routine diet  Activity: Advance as tolerated. Pelvic rest for 6 weeks.   Outpatient follow up: Follow-up in 2 days for drain removal and then in 6 weeks for a postpartum visit. Follow up Appt:Future Appointments Date Time Provider [place]  08/06/2015 2:00 PM Jonnie Kind, MD [facility]   Follow up visit: No Follow-up on file.  Postpartum contraception: Tubal Ligation  Newborn Data: Live born female  Birth  Weight: 7 lb 6.2 oz (3350 g) APGAR: 9, 9  Baby Feeding: Breast Disposition:home with mother   08/01/2015 Evette Doffing, MD  OB FELLOW DISCHARGE ATTESTATION  I have seen and examined this patient and agree with above documentation in the resident's note.   Desma Maxim, MD 2:36 AM

## 2015-08-01 NOTE — Discharge Instructions (Signed)

## 2015-08-01 NOTE — Telephone Encounter (Signed)
Spoke with Anderson Malta and she advised the pt would need to be seen tomorrow by Dr. Elonda Husky.

## 2015-08-01 NOTE — Progress Notes (Signed)
Subjective: Postpartum Day : Cesarean Delivery Patient reports tolerating PO, + flatus, + BM and no problems voiding.    Objective: Vital signs in last 24 hours: Temp:  [97.6 F (36.4 C)-98.1 F (36.7 C)] 98.1 F (36.7 C) (04/12 0545) Pulse Rate:  [62-75] 75 (04/12 0545) Resp:  [18] 18 (04/12 0545) BP: (102-120)/(67-82) 102/67 mmHg (04/12 0545) SpO2:  [97 %] 97 % (04/11 0855)   JP Drain: 35ml serosanginous drainage in the last 24 hours.  Physical Exam:  General: alert, cooperative and no distress Lochia: appropriate Uterine Fundus: firm Incision: healing well DVT Evaluation: No evidence of DVT seen on physical exam.   Recent Labs  07/31/15 0524  HGB 10.5*  HCT 32.8*    Assessment/Plan: Status post Cesarean section. Doing well postoperatively.  Plan for discharge home today or tomorrow.  Berna Spare Kayven Aldaco 08/01/2015, 7:41 AM

## 2015-08-02 ENCOUNTER — Ambulatory Visit (INDEPENDENT_AMBULATORY_CARE_PROVIDER_SITE_OTHER): Payer: Managed Care, Other (non HMO) | Admitting: Obstetrics & Gynecology

## 2015-08-02 ENCOUNTER — Encounter: Payer: Self-pay | Admitting: Obstetrics & Gynecology

## 2015-08-02 VITALS — BP 116/82 | Ht 65.0 in | Wt 200.0 lb

## 2015-08-02 DIAGNOSIS — Z9889 Other specified postprocedural states: Secondary | ICD-10-CM

## 2015-08-02 DIAGNOSIS — Z98891 History of uterine scar from previous surgery: Secondary | ICD-10-CM

## 2015-08-02 NOTE — Progress Notes (Signed)
Patient ID: Kristen Dunn, female   DOB: [birth year], 40 y.o.   MRN: [id number]  POD #3 from repeat Caesarean BTL and abdominal wall revision Drain in LLQ put out 30 cc in last 24 hours  Reviewed wound drain care with pt and husband  Incision clean dry intact No bruising or erythema  Pain meds adequate  Has appt on Monday--->  Keep

## 2015-08-06 ENCOUNTER — Ambulatory Visit (HOSPITAL_COMMUNITY)
Admission: RE | Admit: 2015-08-06 | Discharge: 2015-08-06 | Disposition: A | Discharge status: Home / Self Care | Payer: Managed Care, Other (non HMO) | Source: Ambulatory Visit | Attending: Obstetrics and Gynecology | Admitting: Obstetrics and Gynecology

## 2015-08-06 ENCOUNTER — Encounter: Payer: Self-pay | Admitting: Obstetrics and Gynecology

## 2015-08-06 ENCOUNTER — Ambulatory Visit (INDEPENDENT_AMBULATORY_CARE_PROVIDER_SITE_OTHER): Payer: Managed Care, Other (non HMO) | Admitting: Obstetrics and Gynecology

## 2015-08-06 VITALS — BP 120/80 | Ht 65.0 in | Wt 198.0 lb

## 2015-08-06 DIAGNOSIS — O870 Superficial thrombophlebitis in the puerperium: Secondary | ICD-10-CM | POA: Diagnosis not present

## 2015-08-06 DIAGNOSIS — I8001 Phlebitis and thrombophlebitis of superficial vessels of right lower extremity: Secondary | ICD-10-CM | POA: Insufficient documentation

## 2015-08-06 MED ORDER — HYDROCODONE-ACETAMINOPHEN 5-325 MG PO TABS: 1.0000 | ORAL_TABLET | Freq: Four times a day (QID) | ORAL | Status: DC | PRN

## 2015-08-06 MED ORDER — ENOXAPARIN SODIUM 100 MG/ML ~~LOC~~ SOLN: 90.0000 mg | SUBCUTANEOUS | Status: DC

## 2015-08-06 NOTE — Progress Notes (Signed)
Patient ID: Kristen Dunn, female   DOB: 31-Jul-1975, 40 y.o.   MRN: [id number] Subjective:  Kristen Dunn is a 40 y.o. female now 1 weeks status post REPEAT CESAREAN SECTION WITH BILATERAL TUBAL LIGATION, WIDE EXCISION OF CICHEIX (Bilateral). Pt complains of acute soreness to the right, inner thigh. Pt further reports improving pain at incision site. Pt also complains of swelling to BLE with right significantly worse than left.   Review of Systems Negative except pain/soreness to right inner thigh; improving pain at incision site; swelling to BLE with right worse than left.    Diet: NA   Bowel movements : normal.  Pain is controlled with current analgesics. Medications being used: narcotic analgesics including hydrocodone/ibuprofen (Vicoprofen).  Objective:  BP 120/80 mmHg  Ht 5\' 5"  (1.651 m)  Wt 198 lb (89.812 kg)  BMI 32.95 kg/m2  Breastfeeding? Yes General:Well developed, well nourished.  No acute distress. Abdomen: Bowel sounds normal, soft, non-tender. Pelvic Exam: NA negative Homans sign bilateral. Right leg: tender firm cord right upper inner thigh, not crossing groin or knee. Incision(s):   Healing well, no drainage, no erythema, no hernia, no swelling, no dehiscence, 21mm subcutaneous JP drain removed from incision site.  Assessment:  Post-Op 1 weeks s/p REPEAT CESAREAN SECTION WITH BILATERAL TUBAL LIGATION, WIDE EXCISION OF CICHEIX (Bilateral) cicatrix Superficial saphenous thrombophlebitis right thigh.    Plan:  1.Wound care discussed  Drain removed. 2. . current medications; Lovenox  1 mg/d added x 6 wk 3. Activity restrictions: same as post op 4. return to work: not applicable. 5. Follow up in 4 weeks  6. Venous US RLE to r/o involvement of deep venous system  By signing my name below, I, Terressa Koyanagi, attest that this documentation has been prepared under the direction and in the presence of Mallory Shirk, MD. Electronically Signed: Terressa Koyanagi, ED Scribe.  08/06/2015. 2:59 PM.   personally performed the services described in this documentation, which was SCRIBED in my presence. The recorded information has been reviewed and considered accurate. It has been edited as necessary during review. Jonnie Kind, MD  I personally performed the services described in this documentation, which was SCRIBED in my presence. The recorded information has been reviewed and considered accurate. It has been edited as necessary during review. Jonnie Kind, MD

## 2015-08-06 NOTE — Patient Instructions (Signed)
Phlebitis  Phlebitis is soreness and swelling (inflammation) of a vein. This can occur in your arms, legs, or torso (trunk), as well as deeper inside your body. Phlebitis is usually not serious when it occurs close to the surface of the body. However, it can cause serious problems when it occurs in a vein deeper inside the body.  CAUSES   Phlebitis can be triggered by various things, including:    Reduced blood flow through your veins. This can happen with:    Bed rest over a long period.    Long-distance travel.    Injury.    Surgery.    Being overweight (obese) or pregnant.   Having an IV tube put in the vein and getting certain medicines through the vein.   Cancer and cancer treatment.   Use of illegal drugs taken through the vein.   Inflammatory diseases.   Inherited (genetic) diseases that increase the risk of blood clots.   Hormone therapy, such as birth control pills.  SIGNS AND SYMPTOMS    Red, tender, swollen, and painful area on your skin. Usually, the area will be long and narrow.   Firmness along the center of the affected area. This can indicate that a blood clot has formed.   Low-grade fever.  DIAGNOSIS   A health care provider can usually diagnose phlebitis by examining the affected area and asking about your symptoms. To check for infection or blood clots, your health care provider may order blood tests or an ultrasound exam of the area. Blood tests and your family history may also indicate if you have an underlying genetic disease that causes blood clots. Occasionally, a piece of tissue is taken from the body (biopsy sample) if an unusual cause of phlebitis is suspected.  TREATMENT   Treatment will vary depending on the severity of the condition and the area of the body affected. Treatment may include:   Use of a warm compress or heating pad.   Use of compression stockings or bandages.   Anti-inflammatory medicines.   Removal of any IV tube that may be causing the problem.   Medicines  that kill germs (antibiotics) if an infection is present.   Blood-thinning medicines if a blood clot is suspected or present.   In rare cases, surgery may be needed to remove damaged sections of vein.  HOME CARE INSTRUCTIONS    Only take over-the-counter or prescription medicines as directed by your health care provider. Take all medicines exactly as prescribed.   Raise (elevate) the affected area above the level of your heart as directed by your health care provider.   Apply a warm compress or heating pad to the affected area as directed by your health care provider. Do not sleep with the heating pad.   Use compression stockings or bandages as directed. These will speed healing and prevent the condition from coming back.   If you are on blood thinners:    Get follow-up blood tests as directed by your health care provider.    Check with your health care provider before using any new medicines.    Carry a medical alert card or wear your medical alert jewelry to show that you are on blood thinners.   For phlebitis in the legs:    Avoid prolonged standing or bed rest.    Keep your legs moving. Raise your legs when sitting or lying.   Do not smoke.   Women, particularly those over the age of 35, should consider   the risks and benefits of taking the contraceptive pill. This kind of hormone treatment can increase your risk for blood clots.   Follow up with your health care provider as directed.  SEEK MEDICAL CARE IF:    You have unusual bruising or any bleeding problems.   Your swelling or pain in the affected area is not improving.   You are on anti-inflammatory medicine, and you develop belly (abdominal) pain.  SEEK IMMEDIATE MEDICAL CARE IF:    You have a sudden onset of chest pain or difficulty breathing.   You have a fever or persistent symptoms for more than 2-3 days.   You have a fever and your symptoms suddenly get worse.  MAKE SURE YOU:   Understand these instructions.   Will watch your  condition.   Will get help right away if you are not doing well or get worse.     This information is not intended to replace advice given to you by your health care provider. Make sure you discuss any questions you have with your health care provider.     Document Released: 04/01/2001 Document Revised: 01/26/2013 Document Reviewed: 12/13/2012  Elsevier Interactive Patient Education 2016 [employer]

## 2015-08-20 ENCOUNTER — Encounter: Payer: Self-pay | Admitting: Obstetrics and Gynecology

## 2015-08-20 ENCOUNTER — Ambulatory Visit (INDEPENDENT_AMBULATORY_CARE_PROVIDER_SITE_OTHER): Payer: Managed Care, Other (non HMO) | Admitting: Obstetrics and Gynecology

## 2015-08-20 VITALS — BP 120/80 | Ht 65.0 in

## 2015-08-20 DIAGNOSIS — M791 Myalgia: Secondary | ICD-10-CM

## 2015-08-20 DIAGNOSIS — R1031 Right lower quadrant pain: Secondary | ICD-10-CM

## 2015-08-20 DIAGNOSIS — M7918 Myalgia, other site: Secondary | ICD-10-CM

## 2015-08-20 NOTE — Progress Notes (Signed)
Patient ID: Kristen Dunn, female   DOB: 1976-01-16, 40 y.o.   MRN: [id number] Pt worked in today for lower abdominal pain. 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 Pt states that she has had pain in her lower right abdomen.

## 2015-08-21 DIAGNOSIS — M7918 Myalgia, other site: Secondary | ICD-10-CM | POA: Insufficient documentation

## 2015-08-21 NOTE — Progress Notes (Signed)
Patient ID: Kristen Dunn, female   DOB: 10/15/1975, 40 y.o.   MRN: [id number] Subjective:     Kristen Dunn is a 40 y.o. female who presents to the clinic 3 weeks status post Cesarean section with wide excision of cicatrix with postop care initially complicated by a superficial left Superficial thrombophlebitis . Diet:       regular without difficulty. Bowel function is: normal. Pain:     Patient had acute pain along the left right side of the abdomen without bruising or swelling or masses  Leg swelling is gone away there is no asymmetry in the legs this time  Review of Systems Continue Lovenox 40 daily    Objective:    BP 120/80 mmHg  Ht 5\' 5"  (1.651 m)  Breastfeeding? Yes General:  alert, cooperative and no distress  Abdomen: soft, bowel sounds active, non-tender  Incision:   healing well, no drainage, no erythema, no hernia, no seroma, no swelling, well approximated, no masses or hernia, no dehiscence, incision well approximated       Pelvic: Deferred    Assessment:    Postoperative course complicated by Acute right lower quadrant pain, felt to be musculoskeletal not requiring intervention. No evidence of hernia Operative findings again reviewed. Pathology report discussed.    Plan:    1. Continue any current medications. 2. Wound care discussed. 3. Activity restrictions: no repetitive motion and Continue routine postop care reduce lifting and straining. Avoid constipation 4. Anticipated return to work: not applicable. 5. Follow up: 3 week for  routine postpartum visit.

## 2015-08-29 ENCOUNTER — Ambulatory Visit (INDEPENDENT_AMBULATORY_CARE_PROVIDER_SITE_OTHER): Payer: Managed Care, Other (non HMO) | Admitting: Women's Health

## 2015-08-29 ENCOUNTER — Encounter: Payer: Self-pay | Admitting: Women's Health

## 2015-08-29 DIAGNOSIS — N83202 Unspecified ovarian cyst, left side: Secondary | ICD-10-CM

## 2015-08-29 DIAGNOSIS — O870 Superficial thrombophlebitis in the puerperium: Secondary | ICD-10-CM

## 2015-08-29 NOTE — Progress Notes (Signed)
Patient ID: Kristen Dunn, female   DOB: 10-May-1975, 40 y.o.   MRN: [id number] Subjective:    Kristen Dunn is a 40 y.o. G88P2012 Caucasian female who presents for a postpartum visit. She is 4 weeks postpartum following a repeat cesarean section, low transverse incision, wide excision of cicatrix, and BTL at 39 gestational weeks. Anesthesia: spinal. I have fully reviewed the prenatal and intrapartum course. Postpartum course has been superficial saphenous thrombophlebitis Rt thigh confirmed by venous doppler, on Lovenox 40mg  x daily x 6wks. Baby's course has been uncomplicated. Baby is feeding by pumping breast milk- breasts are sore and is going to quit when she returns to work. Denies redness, streaks, hard areas, fever/chills. Bleeding thin lochia. Bowel function is normal. Bladder function is normal. Patient is not sexually active. Last sexual activity: prior to birth of baby. Contraception method is tubal ligation. Postpartum depression screening: negative. Score 7.  Last pap 06/13/13 and was neg w/ -HRHPV.  The following portions of the patient's history were reviewed and updated as appropriate: allergies, current medications, past medical history, past surgical history and problem list.  Review of Systems Pertinent items are noted in HPI.   Filed Vitals:   08/29/15 1034  BP: 114/68  Pulse: 76  Weight: 189 lb (85.73 kg)   No LMP recorded.  Objective:   General:  alert, cooperative and no distress   Breasts:  deferred, no complaints  Lungs: clear to auscultation bilaterally  Heart:  regular rate and rhythm  Abdomen: soft, nontender, c/s and wide excision of cicatrix healing well, no s/s infection   Vulva: normal  Vagina: normal vagina  Cervix:  closed  Corpus: Well-involuted  Adnexa:  Non-palpable  Rectal Exam: No hemorrhoids        Assessment:   Postpartum exam 4 wks s/p RLTCS and wide excision cicatrix, BTL Pumping breast milk Depression screening Contraception counseling   Sore breasts from pumping Superficial thrombophlebitis Rt upper thigh 1wk post-op, now on Lovenox x 3wks Lt ovarian cyst  Plan:  Try soft shells for pumping, can lubricate breasts w/ olive or coconut oil Contraception: tubal ligation Follow up in: 4 weeks for pelvic u/s to reassess Lt ovarian cyst and f/u w/ me  Stop Lovenox in 3wks  Tawnya Crook CNM, [facility] 08/29/2015 10:44 AM

## 2015-09-24 ENCOUNTER — Telehealth: Payer: Self-pay | Admitting: *Deleted

## 2015-09-24 NOTE — Telephone Encounter (Signed)
Pt requesting return to work note. Note completed and faxed per pt request.

## 2015-10-01 ENCOUNTER — Ambulatory Visit (INDEPENDENT_AMBULATORY_CARE_PROVIDER_SITE_OTHER): Payer: Managed Care, Other (non HMO)

## 2015-10-01 ENCOUNTER — Encounter: Payer: Self-pay | Admitting: Adult Health

## 2015-10-01 ENCOUNTER — Other Ambulatory Visit: Payer: Self-pay | Admitting: Women's Health

## 2015-10-01 ENCOUNTER — Ambulatory Visit (INDEPENDENT_AMBULATORY_CARE_PROVIDER_SITE_OTHER): Payer: Managed Care, Other (non HMO) | Admitting: Adult Health

## 2015-10-01 VITALS — BP 106/70 | HR 88 | Ht 65.0 in | Wt 189.5 lb

## 2015-10-01 DIAGNOSIS — N83202 Unspecified ovarian cyst, left side: Secondary | ICD-10-CM

## 2015-10-01 DIAGNOSIS — N854 Malposition of uterus: Secondary | ICD-10-CM

## 2015-10-01 NOTE — Patient Instructions (Signed)
Follow up US in 3 months

## 2015-10-01 NOTE — Progress Notes (Signed)
PELVIC US TA/TV: homogenous anteverted uterus,EEC 14.9 mm, normal rt ov,two simple lt ovarian cysts  (#1) 4.7 x 3.8 x 3.9 cm,(#2) 2.5 x 1.8 x 2.2 cm(lt ov is better visualized transabdominally),no free fluid,lt adnexal pain during ultrasound,ov's appear to be mobile

## 2015-10-01 NOTE — Progress Notes (Signed)
Subjective:     Patient ID: Kristen Dunn, female   DOB: [birth year], 40 y.o.   MRN: [id number]  HPI Kristen Dunn is a 40 year old white female in for [place] to assess left ovarian cyst, has some pain if constipated.It was originally   found in September when pregnant.  Review of Systems +left ovarian cyst Reviewed past medical,surgical, social and family history. Reviewed medications and allergies.     Objective:   Physical Exam   BP 106/70 mmHg  Pulse 88  Ht 5\' 5"  (1.651 m)  Wt 189 lb 8 oz (85.957 kg)  BMI 31.53 kg/m2  Breastfeeding? No Reviewed [place] with pt., now has 2. homogenous anteverted uterus,EEC 14.9 mm, normal rt ov,two simple lt ovarian cysts (#1) 4.7 x 3.8 x 3.9 cm,(#2) 2.5 x 1.8 x 2.2 cm(lt ov is better visualized transabdominally),no free fluid,lt adnexal pain during ultrasound,ov's appear to be mobile Will follow for now, will get [place] in 3 months.    Assessment:     Left ovarian cyst    Plan:      Follow up US in 3 months to assess left ovarian cyst

## 2015-12-27 ENCOUNTER — Ambulatory Visit (INDEPENDENT_AMBULATORY_CARE_PROVIDER_SITE_OTHER): Payer: Managed Care, Other (non HMO)

## 2015-12-27 DIAGNOSIS — N854 Malposition of uterus: Secondary | ICD-10-CM

## 2015-12-27 DIAGNOSIS — N83202 Unspecified ovarian cyst, left side: Secondary | ICD-10-CM

## 2015-12-27 NOTE — Progress Notes (Signed)
PELVIC US TA: Homogeneous anteverted uterus wnl, normal right ov,simple left ov cyst 4.4 x 3.1 x 4.1 cm,left ovary can only be seen on TA, it is located high in the left adnexa,EEC 14.9 mm,no free fluid seen.

## 2015-12-28 ENCOUNTER — Telehealth: Payer: Self-pay | Admitting: Adult Health

## 2015-12-28 NOTE — Telephone Encounter (Signed)
Pt aware of US results. 

## 2016-07-21 ENCOUNTER — Telehealth: Payer: Self-pay | Admitting: *Deleted

## 2016-07-21 ENCOUNTER — Ambulatory Visit (HOSPITAL_COMMUNITY)
Admission: RE | Admit: 2016-07-21 | Discharge: 2016-07-21 | Disposition: A | Discharge status: Home / Self Care | Payer: Managed Care, Other (non HMO) | Source: Ambulatory Visit | Attending: Obstetrics & Gynecology | Admitting: Obstetrics & Gynecology

## 2016-07-21 DIAGNOSIS — I80299 Phlebitis and thrombophlebitis of other deep vessels of unspecified lower extremity: Secondary | ICD-10-CM | POA: Diagnosis present

## 2016-07-21 DIAGNOSIS — I803 Phlebitis and thrombophlebitis of lower extremities, unspecified: Secondary | ICD-10-CM

## 2016-07-21 NOTE — Telephone Encounter (Signed)
Patient called stating she has had some swelling and discomfort in her upper right thigh. She has previously had a superficial thrombophlebitis post partum in 2017 in the same leg. Advised patient she would need to have an ultrasound to rule out. Spoke with Dr Elonda Husky. Order placed for U/S and advised to start Aspirin 325mg  daily but patient states it makes her wheeze. She is scheduled to see her primary doctor on Wednesday but no one is answering calls today. Advised patient they could treat her if necessary. Pt verbalized understanding.

## 2016-07-22 ENCOUNTER — Telehealth: Payer: Self-pay | Admitting: *Deleted

## 2016-07-22 NOTE — Telephone Encounter (Signed)
Informed patient of imaging results. Advised patient to talk with her PCP tomorrow at her appointment, to discuss anti-coagulation regimen. Pt verbalized understanding.

## 2016-07-23 ENCOUNTER — Other Ambulatory Visit: Payer: Self-pay | Admitting: Family

## 2016-07-23 DIAGNOSIS — Z1231 Encounter for screening mammogram for malignant neoplasm of breast: Secondary | ICD-10-CM

## 2016-08-14 ENCOUNTER — Ambulatory Visit
Admission: RE | Admit: 2016-08-14 | Discharge: 2016-08-14 | Disposition: A | Discharge status: Home / Self Care | Payer: Managed Care, Other (non HMO) | Source: Ambulatory Visit | Attending: Family | Admitting: Family

## 2016-08-14 DIAGNOSIS — Z1231 Encounter for screening mammogram for malignant neoplasm of breast: Secondary | ICD-10-CM

## 2016-10-13 ENCOUNTER — Encounter (HOSPITAL_COMMUNITY): Payer: Self-pay

## 2017-02-09 ENCOUNTER — Ambulatory Visit (HOSPITAL_COMMUNITY)
Admission: EM | Admit: 2017-02-09 | Discharge: 2017-02-09 | Disposition: A | Discharge status: Home / Self Care | Payer: Managed Care, Other (non HMO) | Attending: Nurse Practitioner | Admitting: Nurse Practitioner

## 2017-02-09 ENCOUNTER — Encounter (HOSPITAL_COMMUNITY): Payer: Self-pay | Admitting: Emergency Medicine

## 2017-02-09 DIAGNOSIS — G43911 Migraine, unspecified, intractable, with status migrainosus: Secondary | ICD-10-CM

## 2017-02-09 MED ORDER — METOCLOPRAMIDE HCL 5 MG/ML IJ SOLN
5.0000 mg | Freq: Once | INTRAMUSCULAR | Status: AC
  Administered 2017-02-09: 5 mg via INTRAMUSCULAR

## 2017-02-09 MED ORDER — CYCLOBENZAPRINE HCL 10 MG PO TABS: 10.0000 mg | ORAL_TABLET | Freq: Once | ORAL | Status: DC

## 2017-02-09 MED ORDER — DIPHENHYDRAMINE HCL 50 MG/ML IJ SOLN
25.0000 mg | Freq: Once | INTRAMUSCULAR | Status: AC
  Administered 2017-02-09: 25 mg via INTRAMUSCULAR

## 2017-02-09 MED ORDER — CYCLOBENZAPRINE HCL 10 MG PO TABS: 10.0000 mg | ORAL_TABLET | Freq: Two times a day (BID) | ORAL | 0 refills | Status: DC | PRN

## 2017-02-09 MED ORDER — DIPHENHYDRAMINE HCL 50 MG/ML IJ SOLN
INTRAMUSCULAR | Status: AC
  Filled 2017-02-09: qty 1

## 2017-02-09 MED ORDER — DEXAMETHASONE SODIUM PHOSPHATE 10 MG/ML IJ SOLN
INTRAMUSCULAR | Status: AC
  Filled 2017-02-09: qty 1

## 2017-02-09 MED ORDER — METOCLOPRAMIDE HCL 5 MG/ML IJ SOLN
INTRAMUSCULAR | Status: AC
Start: 2017-02-09 — End: ?
  Filled 2017-02-09: qty 2

## 2017-02-09 MED ORDER — KETOROLAC TROMETHAMINE 60 MG/2ML IM SOLN
60.0000 mg | Freq: Once | INTRAMUSCULAR | Status: AC
  Administered 2017-02-09: 60 mg via INTRAMUSCULAR

## 2017-02-09 MED ORDER — KETOROLAC TROMETHAMINE 60 MG/2ML IM SOLN
INTRAMUSCULAR | Status: AC
  Filled 2017-02-09: qty 2

## 2017-02-09 MED ORDER — DEXAMETHASONE SODIUM PHOSPHATE 10 MG/ML IJ SOLN
10.0000 mg | Freq: Once | INTRAMUSCULAR | Status: AC
  Administered 2017-02-09: 10 mg via INTRAMUSCULAR

## 2017-02-09 NOTE — ED Triage Notes (Signed)
Pt hx of migraines, has had a migraine x2 weeks. No neuro deficits at this time.

## 2017-02-09 NOTE — ED Provider Notes (Signed)
[facility]    CSN: [id number] Arrival date & time: 02/09/17  1502     History   Chief Complaint Chief Complaint  Patient presents with  . Migraine    HPI Kristen Dunn is a 41 y.o. female.   Subjective:  Kristen Dunn is a 41 y.o. female who presents for evaluation of headache. Symptoms began about 1.5 weeks ago. Rapidity of onset was gradual. The patient has a history of migraines and gets headaches occasionally. The headache is described as moderate. Described as a pounding and throbbing sensation. The patient rates the pain a 7 on a scale from 1 to 10. She also endorses photophobia, nausea and neck tightness. Precipitating factors include none which have been determined. The headache was not preceded by an aura. She denies any dizziness, numbness of extremities, gait instability, speech difficulties, vision problems, vomiting or phonophobia. Home treatment has included ibuprofen, Imitrex oral, darkening the room, massage and has seen a chiropractor with inadequate improvement.   The following portions of the patient's history were reviewed and updated as appropriate: allergies, current medications, past family history, past medical history, past social history, past surgical history and problem list.         Past Medical History:  Diagnosis Date  . AMA (advanced maternal age) multigravida 12+   . Anemia   . Asthma   . Family history of adverse reaction to anesthesia    mother has nausea  . Gestational diabetes    glyburide  . Hx of migraines 10/26/2012  . Other and unspecified ovarian cyst 03/30/2013  . PONV (postoperative nausea and vomiting)     Patient Active Problem List   Diagnosis Date Noted  . Musculoskeletal pain 08/21/2015  . Superficial thrombophlebitis in puerperium 08/06/2015  . Status post repeat low transverse cesarean section 07/30/2015  . Consultation for female sterilization 07/17/2015  . Left ovarian cyst 03/12/2015  . Rubella  non-immune status, antepartum 11/08/2013  . Other and unspecified ovarian cyst 03/30/2013  . Internal hernia 03/15/2013  . Abdominal pain 03/14/2013  . Hx of migraines 10/26/2012    Past Surgical History:  Procedure Laterality Date  . CESAREAN SECTION  01/13/2012   Procedure: CESAREAN SECTION;  Surgeon: Mora Bellman, MD;  Location: [facility];  Service: Obstetrics;  Laterality: N/A;  . CESAREAN SECTION WITH BILATERAL TUBAL LIGATION Bilateral 07/30/2015   Procedure: REPEAT CESAREAN SECTION WITH BILATERAL TUBAL LIGATION, WIDE EXCISION OF CICHEIX;  Surgeon: Jonnie Kind, MD;  Location: [facility];  Service: Obstetrics;  Laterality: Bilateral;  . Gastric Bypass 2008    . INCISIONAL HERNIA REPAIR  03/31/2012   Procedure: LAPAROSCOPIC INCISIONAL HERNIA;  Surgeon: Edward Jolly, MD;  Location: [facility];  Service: General;  Laterality: N/A;  LAPAROSCOPY REPAIR INTERNAL HERNIA  petersons defect  . LAPAROSCOPIC GASTRIC BANDING WITH HIATAL HERNIA REPAIR N/A 03/14/2013   Procedure: DIAGNOSTIC LAPAROSCOPY  WITH  INTERNAL HERNIA  REDUCTION WITH CLOSURE;  Surgeon: Shann Medal, MD;  Location: [facility];  Service: General;  Laterality: N/A;  . TONSILLECTOMY      OB History    Gravida Para Term Preterm AB Living   3 2 2  0 1 2   SAB TAB Ectopic Multiple Live Births   1 0 0 0 2       Home Medications    Prior to Admission medications   Medication Sig Start Date End Date Taking? Authorizing Provider  SUMAtriptan Succinate (IMITREX PO) Take by mouth as needed.  Yes [provider]  acetaminophen (TYLENOL) 325 MG tablet Take 650 mg by mouth every 6 (six) hours as needed.    [provider]  Prenatal Vit-Fe Fumarate-FA (PRENATAL VITAMIN PO) Take 1 tablet by mouth daily.     [provider]    Family History Family History  Problem Relation Age of Onset  . COPD Mother   . Asthma Mother   . Hypertension Father   . Aneurysm Father   . Asthma Sister   . Thyroid disease  Maternal Grandmother   . Stroke Maternal Grandmother   . Cancer Maternal Aunt        breast  . Breast cancer Maternal Aunt   . Von Willebrand disease Cousin     Social History Social History  Substance Use Topics  . Smoking status: Never Smoker  . Smokeless tobacco: Never Used  . Alcohol use No     Allergies   Penicillins; Aspirin; Alupent [metaproterenol]; Lactose intolerance (gi); and Latex   Review of Systems Review of Systems  Eyes: Positive for photophobia. Negative for visual disturbance.  Musculoskeletal: Positive for neck pain.  Neurological: Positive for weakness and headaches. Negative for dizziness, speech difficulty and numbness.  All other systems reviewed and are negative.    Physical Exam Triage Vital Signs ED Triage Vitals  Enc Vitals Group     BP 02/09/17 1519 119/78     Pulse Rate 02/09/17 1519 93     Resp 02/09/17 1519 18     Temp 02/09/17 1518 98.3 F (36.8 C)     Temp src --      SpO2 02/09/17 1519 100 %     Weight --      Height --      Head Circumference --      Peak Flow --      Pain Score 02/09/17 1519 9     Pain Loc --      Pain Edu? --      Excl. in [place]? --    No data found.   Updated Vital Signs BP 119/78   Pulse 93   Temp 98.3 F (36.8 C)   Resp 18   LMP 02/02/2017   SpO2 100%   Visual Acuity Right Eye Distance:   Left Eye Distance:   Bilateral Distance:    Right Eye Near:   Left Eye Near:    Bilateral Near:     Physical Exam  Constitutional: She is oriented to person, place, and time. She appears well-developed and well-nourished.  Neck: Normal range of motion. Neck supple.  Cardiovascular: Normal rate, regular rhythm and normal heart sounds.   Pulmonary/Chest: Effort normal and breath sounds normal.  Musculoskeletal: Normal range of motion.  Neurological: She is alert and oriented to person, place, and time. No cranial nerve deficit or sensory deficit.  Skin: Skin is warm and dry.  Psychiatric: She has a  normal mood and affect.     UC Treatments / Results  Labs (all labs ordered are listed, but only abnormal results are displayed) Labs Reviewed - No data to display  EKG  EKG Interpretation None       Radiology No results found.  Procedures Procedures (including critical care time)  Medications Ordered in UC Medications  ketorolac (TORADOL) injection 60 mg (not administered)  metoCLOPramide (REGLAN) injection 5 mg (not administered)  dexamethasone (DECADRON) injection 10 mg (not administered)  diphenhydrAMINE (BENADRYL) injection 25 mg (not administered)  cyclobenzaprine (FLEXERIL) tablet 10 mg (not  administered)     Initial Impression / Assessment and Plan / UC Course  I have reviewed the triage vital signs and the nursing notes.  Pertinent labs & imaging results that were available during my care of the patient were reviewed by me and considered in my medical decision making (see chart for details).     41 yo CF with history of migraines that presents with her classic migraine x 1.5 weeks. Her usual treatment has failed. No focal neurological deficits noted. Patient given Toradol, Reglan, decadron and benadryl in office with significant improvement in symptoms.   Discussed diagnosis and treatment with patient. All questions have been answered and all concerns have been addressed. The patient verbalized understanding and had no further questions.   Final Clinical Impressions(s) / UC Diagnoses   Final diagnoses:  Intractable migraine with status migrainosus, unspecified migraine type    New Prescriptions New Prescriptions   No medications on file     Controlled Substance Prescriptions [place] Controlled Substance Registry consulted? Not Applicable   Enrique Sack, [place] 02/09/17 1610

## 2017-10-27 ENCOUNTER — Ambulatory Visit (INDEPENDENT_AMBULATORY_CARE_PROVIDER_SITE_OTHER): Payer: Managed Care, Other (non HMO) | Admitting: Adult Health

## 2017-10-27 ENCOUNTER — Other Ambulatory Visit (HOSPITAL_COMMUNITY)
Admission: RE | Admit: 2017-10-27 | Discharge: 2017-10-27 | Disposition: A | Discharge status: Home / Self Care | Payer: Managed Care, Other (non HMO) | Source: Ambulatory Visit | Attending: Adult Health | Admitting: Adult Health

## 2017-10-27 ENCOUNTER — Encounter: Payer: Self-pay | Admitting: Adult Health

## 2017-10-27 VITALS — BP 136/83 | HR 82 | Ht 65.0 in | Wt 187.0 lb

## 2017-10-27 DIAGNOSIS — Z01419 Encounter for gynecological examination (general) (routine) without abnormal findings: Secondary | ICD-10-CM | POA: Insufficient documentation

## 2017-10-27 DIAGNOSIS — G43009 Migraine without aura, not intractable, without status migrainosus: Secondary | ICD-10-CM | POA: Diagnosis not present

## 2017-10-27 DIAGNOSIS — Z1212 Encounter for screening for malignant neoplasm of rectum: Secondary | ICD-10-CM | POA: Diagnosis not present

## 2017-10-27 DIAGNOSIS — Z1151 Encounter for screening for human papillomavirus (HPV): Secondary | ICD-10-CM | POA: Diagnosis not present

## 2017-10-27 DIAGNOSIS — Z7689 Persons encountering health services in other specified circumstances: Secondary | ICD-10-CM | POA: Insufficient documentation

## 2017-10-27 DIAGNOSIS — Z1211 Encounter for screening for malignant neoplasm of colon: Secondary | ICD-10-CM | POA: Diagnosis not present

## 2017-10-27 LAB — HEMOCCULT GUIAC POC 1CARD (OFFICE): Fecal Occult Blood, POC: NEGATIVE

## 2017-10-27 MED ORDER — NORETHIN-ETH ESTRAD-FE BIPHAS 1 MG-10 MCG / 10 MCG PO TABS: 1.0000 | ORAL_TABLET | Freq: Every day | ORAL | 0 refills | Status: DC

## 2017-10-27 NOTE — Progress Notes (Signed)
Patient ID: [name], female   DOB: October 28, 1975, 42 y.o.   MRN: [id number] History of Present Illness: Williemae is a 42 year old white female, married, G3P2 in for a well woman gyn exam and pap. PCP is Dr Luan Pulling    Current Medications, Allergies, Past Medical History, Past Surgical History, Family History and Social History were reviewed in [facility] record.     Review of Systems: Patient denies any  hearing loss, fatigue, blurred vision, shortness of breath, chest pain, abdominal pain, problems with bowel movements, urination, or intercourse. No joint pain or mood swings. Periods lasting about 7 days since tubal and has migraines without aura seems to be period related. She is sp bariatric surgery and is anemic.  Physical Exam:BP 136/83 (BP Location: Left Arm, Patient Position: Sitting, Cuff Size: Normal)   Pulse 82   Ht 5\' 5"  (1.651 m)   Wt 187 lb (84.8 kg)   LMP 10/19/2017   BMI 31.12 kg/m  General:  Well developed, well nourished, no acute distress Skin:  Warm and dry Neck:  Midline trachea, normal thyroid, good ROM, no lymphadenopathy Lungs; Clear to auscultation bilaterally Breast:  No dominant palpable mass, retraction, or nipple discharge Cardiovascular: Regular rate and rhythm Abdomen:  Soft, non tender, no hepatosplenomegaly Pelvic:  External genitalia is normal in appearance, no lesions.  The vagina is normal in appearance. Urethra has no lesions or masses. The cervix is bulbous. Pap with HPV performed. Uterus is felt to be normal size, shape, and contour.  No adnexal masses or tenderness noted.Bladder is non tender, no masses felt. Rectal: Good sphincter tone, no polyps, or hemorrhoids felt.  Hemoccult negative. Extremities/musculoskeletal:  No swelling or varicosities noted, no clubbing or cyanosis Psych:  No mood changes, alert and cooperative,seems happy PHQ 2 score 0. Will try lo lo loestrin to see if helps with periods and  headaches.  Impression:  1. Encounter for gynecological examination with Papanicolaou smear of cervix   2. Screening for colorectal cancer   3. Encounter for menstrual regulation   4. Migraine without aura and without status migrainosus, not intractable      Plan: Given 3 packs Lo Loestirn to start today F/U in about 10 weeks  Physical in 1 year Pap in 3 if normal Get mammogram yearly Labs with bariatric doctor

## 2017-10-29 LAB — CYTOLOGY - PAP
ADEQUACY: ABSENT
Diagnosis: NEGATIVE
HPV: NOT DETECTED

## 2017-11-03 ENCOUNTER — Other Ambulatory Visit: Payer: Self-pay | Admitting: Obstetrics and Gynecology

## 2017-11-03 ENCOUNTER — Other Ambulatory Visit: Payer: Self-pay | Admitting: Obstetrics & Gynecology

## 2017-11-03 DIAGNOSIS — Z1231 Encounter for screening mammogram for malignant neoplasm of breast: Secondary | ICD-10-CM

## 2017-11-30 ENCOUNTER — Ambulatory Visit
Admission: RE | Admit: 2017-11-30 | Discharge: 2017-11-30 | Disposition: A | Discharge status: Home / Self Care | Payer: Managed Care, Other (non HMO) | Source: Ambulatory Visit | Attending: Obstetrics & Gynecology | Admitting: Obstetrics & Gynecology

## 2017-11-30 DIAGNOSIS — Z1231 Encounter for screening mammogram for malignant neoplasm of breast: Secondary | ICD-10-CM

## 2017-12-28 ENCOUNTER — Ambulatory Visit (INDEPENDENT_AMBULATORY_CARE_PROVIDER_SITE_OTHER): Payer: Managed Care, Other (non HMO) | Admitting: Adult Health

## 2017-12-28 ENCOUNTER — Encounter: Payer: Self-pay | Admitting: Adult Health

## 2017-12-28 VITALS — BP 130/78 | HR 73 | Ht 65.0 in | Wt 190.0 lb

## 2017-12-28 DIAGNOSIS — G43009 Migraine without aura, not intractable, without status migrainosus: Secondary | ICD-10-CM | POA: Diagnosis not present

## 2017-12-28 DIAGNOSIS — Z7689 Persons encountering health services in other specified circumstances: Secondary | ICD-10-CM

## 2017-12-28 MED ORDER — NORETHIN-ETH ESTRAD-FE BIPHAS 1 MG-10 MCG / 10 MCG PO TABS: 1.0000 | ORAL_TABLET | Freq: Every day | ORAL | 4 refills | Status: DC

## 2017-12-28 NOTE — Progress Notes (Signed)
  Subjective:     Patient ID: Kristen Dunn, female   DOB: [birth year], 42 y.o.   MRN: [id number]  HPI Kristen Dunn is a 42 year old white female, married , G3P2, back in follow up on starting lo loestrin for period control and migraines, and is much better.   Review of Systems Has only had 1 migraine since starting OCs Periods are shorter and lighter, so very happy  Reviewed past medical,surgical, social and family history. Reviewed medications and allergies.     Objective:   Physical Exam BP 130/78 (BP Location: Left Arm, Patient Position: Sitting, Cuff Size: Normal)   Pulse 73   Ht 5\' 5"  (1.651 m)   Wt 190 lb (86.2 kg)   LMP 12/22/2017   BMI 31.62 kg/m   Skin warm and dry.  Lungs: clear to ausculation bilaterally. Cardiovascular: regular rate and rhythm. Will continue lo loestrin.    Assessment:     1. Encounter for menstrual regulation   2. Migraine without aura and without status migrainosus, not intractable       Plan:    3 packs and discount card given  Meds ordered this encounter  Medications  . Norethindrone-Ethinyl Estradiol-Fe Biphas (LO LOESTRIN FE) 1 MG-10 MCG / 10 MCG tablet    Sig: Take 1 tablet by mouth daily. Take 1 daily by mouth    Dispense:  3 Package    Refill:  4    BIN [id number], PCN [id number], GRP [id number]    Order Specific Question:   Supervising Provider    Answer:   Tania Ade H [[id number]]  F/U prn

## 2018-03-17 ENCOUNTER — Ambulatory Visit (HOSPITAL_COMMUNITY)
Admission: EM | Admit: 2018-03-17 | Discharge: 2018-03-17 | Disposition: A | Discharge status: Home / Self Care | Payer: Managed Care, Other (non HMO) | Attending: Family Medicine | Admitting: Family Medicine

## 2018-03-17 ENCOUNTER — Encounter (HOSPITAL_COMMUNITY): Payer: Self-pay

## 2018-03-17 ENCOUNTER — Other Ambulatory Visit: Payer: Self-pay

## 2018-03-17 DIAGNOSIS — J4521 Mild intermittent asthma with (acute) exacerbation: Secondary | ICD-10-CM

## 2018-03-17 DIAGNOSIS — J40 Bronchitis, not specified as acute or chronic: Secondary | ICD-10-CM

## 2018-03-17 MED ORDER — PREDNISONE 50 MG PO TABS: 50.0000 mg | ORAL_TABLET | Freq: Every day | ORAL | 0 refills | Status: DC

## 2018-03-17 MED ORDER — DEXTROMETHORPHAN HBR 15 MG/15ML PO LIQD: 20.0000 mg | Freq: Four times a day (QID) | ORAL | 0 refills | Status: DC | PRN

## 2018-03-17 NOTE — ED Provider Notes (Signed)
[facility]    CSN: [id number] Arrival date & time: 03/17/18  1018     History   Chief Complaint Chief Complaint  Patient presents with  . Appointment    1030  . Cough    HPI Kristen Dunn is a 42 y.o. female.   42 year old female comes in for 1 to 2-week history of URI symptoms.  Has a cough, congestion, rhinorrhea.  Denies fever, chills, night sweats.  Now cough is worse at nighttime, and is "deep" albuterol inhaler with some relief of shortness of breath and wheezing.  Never smoker.  Has not taken anything else for the symptoms.  Positive sick contact.     Past Medical History:  Diagnosis Date  . AMA (advanced maternal age) multigravida 24+   . Anemia   . Asthma   . Degenerative lumbar disc   . Family history of adverse reaction to anesthesia    mother has nausea  . Gestational diabetes    glyburide  . Hx of migraines 10/26/2012  . Other and unspecified ovarian cyst 03/30/2013  . PONV (postoperative nausea and vomiting)     Patient Active Problem List   Diagnosis Date Noted  . Migraine without aura and without status migrainosus, not intractable 10/27/2017  . Encounter for menstrual regulation 10/27/2017  . Encounter for gynecological examination with Papanicolaou smear of cervix 10/27/2017  . Screening for colorectal cancer 10/27/2017  . Musculoskeletal pain 08/21/2015  . Superficial thrombophlebitis in puerperium 08/06/2015  . Status post repeat low transverse cesarean section 07/30/2015  . Consultation for female sterilization 07/17/2015  . Left ovarian cyst 03/12/2015  . Rubella non-immune status, antepartum 11/08/2013  . Other and unspecified ovarian cyst 03/30/2013  . Internal hernia 03/15/2013  . Abdominal pain 03/14/2013  . Hx of migraines 10/26/2012    Past Surgical History:  Procedure Laterality Date  . CESAREAN SECTION  01/13/2012   Procedure: CESAREAN SECTION;  Surgeon: Mora Bellman, MD;  Location: [facility];  Service: Obstetrics;   Laterality: N/A;  . CESAREAN SECTION WITH BILATERAL TUBAL LIGATION Bilateral 07/30/2015   Procedure: REPEAT CESAREAN SECTION WITH BILATERAL TUBAL LIGATION, WIDE EXCISION OF CICHEIX;  Surgeon: Jonnie Kind, MD;  Location: [facility];  Service: Obstetrics;  Laterality: Bilateral;  . Gastric Bypass 2008    . INCISIONAL HERNIA REPAIR  03/31/2012   Procedure: LAPAROSCOPIC INCISIONAL HERNIA;  Surgeon: Edward Jolly, MD;  Location: [facility];  Service: General;  Laterality: N/A;  LAPAROSCOPY REPAIR INTERNAL HERNIA  petersons defect  . LAPAROSCOPIC GASTRIC BANDING WITH HIATAL HERNIA REPAIR N/A 03/14/2013   Procedure: DIAGNOSTIC LAPAROSCOPY  WITH  INTERNAL HERNIA  REDUCTION WITH CLOSURE;  Surgeon: Shann Medal, MD;  Location: [facility];  Service: General;  Laterality: N/A;  . TONSILLECTOMY      OB History    Gravida  3   Para  2   Term  2   Preterm  0   AB  1   Living  2     SAB  1   TAB  0   Ectopic  0   Multiple  0   Live Births  2            Home Medications    Prior to Admission medications   Medication Sig Start Date End Date Taking? Authorizing Provider  acetaminophen (TYLENOL) 325 MG tablet Take 650 mg by mouth every 6 (six) hours as needed.    [provider]  ALBUTEROL IN Inhale into the  lungs as needed.    [provider]  Dextromethorphan HBr 15 MG/15ML LIQD Take 20 mLs (20 mg total) by mouth every 6 (six) hours as needed. 03/17/18   Yu, Amy V, PA-C  IRON PO Take by mouth daily.    [provider]  Norethindrone-Ethinyl Estradiol-Fe Biphas (LO LOESTRIN FE) 1 MG-10 MCG / 10 MCG tablet Take 1 tablet by mouth daily. Take 1 daily by mouth 12/28/17   Estill Dooms, NP  predniSONE (DELTASONE) 50 MG tablet Take 1 tablet (50 mg total) by mouth daily. 03/17/18   Ok Edwards, PA-C  Prenatal Vit-Fe Fumarate-FA (PRENATAL VITAMIN PO) Take 1 tablet by mouth daily.     [provider]  SUMAtriptan Succinate (IMITREX PO) Take by mouth as  needed.    [provider]    Family History Family History  Problem Relation Age of Onset  . COPD Mother   . Asthma Mother   . Hypertension Father   . Aneurysm Father   . Asthma Sister   . Thyroid disease Maternal Grandmother   . Stroke Maternal Grandmother   . Cancer Maternal Aunt        breast  . Breast cancer Maternal Aunt   . Von Willebrand disease Cousin     Social History Social History   Tobacco Use  . Smoking status: Never Smoker  . Smokeless tobacco: Never Used  Substance Use Topics  . Alcohol use: No  . Drug use: No     Allergies   Penicillins; Aspirin; Alupent [metaproterenol]; Lactose intolerance (gi); and Latex   Review of Systems Review of Systems  Reason unable to perform ROS: See HPI as above.     Physical Exam Triage Vital Signs ED Triage Vitals  Enc Vitals Group     BP 03/17/18 1102 124/78     Pulse Rate 03/17/18 1102 79     Resp 03/17/18 1102 18     Temp 03/17/18 1102 98.8 F (37.1 C)     Temp Source 03/17/18 1102 Oral     SpO2 03/17/18 1102 99 %     Weight 03/17/18 1100 179 lb (81.2 kg)     Height --      Head Circumference --      Peak Flow --      Pain Score 03/17/18 1100 3     Pain Loc --      Pain Edu? --      Excl. in [place]? --    No data found.  Updated Vital Signs BP 124/78 (BP Location: Right Arm)   Pulse 79   Temp 98.8 F (37.1 C) (Oral)   Resp 18   Wt 179 lb (81.2 kg)   LMP 03/10/2018   SpO2 99%   BMI 29.79 kg/m   Physical Exam  Constitutional: She is oriented to person, place, and time. She appears well-developed and well-nourished. No distress.  HENT:  Head: Normocephalic and atraumatic.  Right Ear: Tympanic membrane, external ear and ear canal normal. Tympanic membrane is not erythematous and not bulging.  Left Ear: Tympanic membrane, external ear and ear canal normal. Tympanic membrane is not erythematous and not bulging.  Nose: Nose normal. Right sinus exhibits no maxillary sinus tenderness and  no frontal sinus tenderness. Left sinus exhibits no maxillary sinus tenderness and no frontal sinus tenderness.  Mouth/Throat: Uvula is midline, oropharynx is clear and moist and mucous membranes are normal.  Eyes: Pupils are equal, round, and reactive to light. Conjunctivae are  normal.  Neck: Normal range of motion. Neck supple.  Cardiovascular: Normal rate, regular rhythm and normal heart sounds. Exam reveals no gallop and no friction rub.  No murmur heard. Pulmonary/Chest: Effort normal and breath sounds normal. No stridor. No respiratory distress. She has no decreased breath sounds. She has no wheezes. She has no rhonchi. She has no rales.  Lymphadenopathy:    She has no cervical adenopathy.  Neurological: She is alert and oriented to person, place, and time.  Skin: Skin is warm and dry.  Psychiatric: She has a normal mood and affect. Her behavior is normal. Judgment normal.     UC Treatments / Results  Labs (all labs ordered are listed, but only abnormal results are displayed) Labs Reviewed - No data to display  EKG None  Radiology No results found.  Procedures Procedures (including critical care time)  Medications Ordered in UC Medications - No data to display  Initial Impression / Assessment and Plan / UC Course  I have reviewed the triage vital signs and the nursing notes.  Pertinent labs & imaging results that were available during my care of the patient were reviewed by me and considered in my medical decision making (see chart for details).    Start prednisone as directed. Other symptomatic treatment discussed. Push fluids. Return precautions given. Return precautions given.  Final Clinical Impressions(s) / UC Diagnoses   Final diagnoses:  Mild intermittent asthma with exacerbation  Bronchitis   ED Prescriptions    Medication Sig Dispense Auth. Provider   predniSONE (DELTASONE) 50 MG tablet Take 1 tablet (50 mg total) by mouth daily. 5 tablet Yu, Amy V, PA-C    Dextromethorphan HBr 15 MG/15ML LIQD Take 20 mLs (20 mg total) by mouth every 6 (six) hours as needed. 800 mL Tobin Chad, [place] 03/17/18 [id number]

## 2018-03-17 NOTE — Discharge Instructions (Signed)
Start prednisone as directed. Dexromethorphan as needed for cough. Keep hydrated, your urine should be clear to pale yellow in color. Tylenol/motrin for fever and pain. Monitor for any worsening of symptoms, chest pain, shortness of breath, wheezing, swelling of the throat, follow up for reevaluation.   For sore throat/cough try using a honey-based tea. Use 3 teaspoons of honey with juice squeezed from half lemon. Place shaved pieces of ginger into 1/2-1 cup of water and warm over stove top. Then mix the ingredients and repeat every 4 hours as needed.

## 2018-03-17 NOTE — ED Triage Notes (Signed)
Pt cc she thinks she has bronchitis .the patient has a deep cough and its worst at night.

## 2018-05-05 ENCOUNTER — Other Ambulatory Visit: Payer: Self-pay | Admitting: Adult Health

## 2018-05-05 MED ORDER — SUMATRIPTAN SUCCINATE 100 MG PO TABS: 100.0000 mg | ORAL_TABLET | ORAL | 1 refills | Status: DC | PRN

## 2018-05-05 NOTE — Progress Notes (Signed)
Refilled imitrex.

## 2018-05-18 DIAGNOSIS — M9901 Segmental and somatic dysfunction of cervical region: Secondary | ICD-10-CM | POA: Diagnosis not present

## 2018-05-18 DIAGNOSIS — M5033 Other cervical disc degeneration, cervicothoracic region: Secondary | ICD-10-CM | POA: Diagnosis not present

## 2018-05-24 DIAGNOSIS — M9901 Segmental and somatic dysfunction of cervical region: Secondary | ICD-10-CM | POA: Diagnosis not present

## 2018-05-24 DIAGNOSIS — M5033 Other cervical disc degeneration, cervicothoracic region: Secondary | ICD-10-CM | POA: Diagnosis not present

## 2018-06-09 DIAGNOSIS — M5033 Other cervical disc degeneration, cervicothoracic region: Secondary | ICD-10-CM | POA: Diagnosis not present

## 2018-06-09 DIAGNOSIS — M9901 Segmental and somatic dysfunction of cervical region: Secondary | ICD-10-CM | POA: Diagnosis not present

## 2018-06-21 DIAGNOSIS — M9901 Segmental and somatic dysfunction of cervical region: Secondary | ICD-10-CM | POA: Diagnosis not present

## 2018-06-21 DIAGNOSIS — M5033 Other cervical disc degeneration, cervicothoracic region: Secondary | ICD-10-CM | POA: Diagnosis not present

## 2018-07-07 DIAGNOSIS — M9901 Segmental and somatic dysfunction of cervical region: Secondary | ICD-10-CM | POA: Diagnosis not present

## 2018-07-07 DIAGNOSIS — M5033 Other cervical disc degeneration, cervicothoracic region: Secondary | ICD-10-CM | POA: Diagnosis not present

## 2018-07-15 DIAGNOSIS — M5033 Other cervical disc degeneration, cervicothoracic region: Secondary | ICD-10-CM | POA: Diagnosis not present

## 2018-07-15 DIAGNOSIS — M9901 Segmental and somatic dysfunction of cervical region: Secondary | ICD-10-CM | POA: Diagnosis not present

## 2018-08-04 DIAGNOSIS — M9901 Segmental and somatic dysfunction of cervical region: Secondary | ICD-10-CM | POA: Diagnosis not present

## 2018-08-04 DIAGNOSIS — M5033 Other cervical disc degeneration, cervicothoracic region: Secondary | ICD-10-CM | POA: Diagnosis not present

## 2018-08-16 DIAGNOSIS — M9901 Segmental and somatic dysfunction of cervical region: Secondary | ICD-10-CM | POA: Diagnosis not present

## 2018-08-16 DIAGNOSIS — M5033 Other cervical disc degeneration, cervicothoracic region: Secondary | ICD-10-CM | POA: Diagnosis not present

## 2018-08-24 DIAGNOSIS — M9901 Segmental and somatic dysfunction of cervical region: Secondary | ICD-10-CM | POA: Diagnosis not present

## 2018-08-24 DIAGNOSIS — M5033 Other cervical disc degeneration, cervicothoracic region: Secondary | ICD-10-CM | POA: Diagnosis not present

## 2018-09-02 DIAGNOSIS — M9901 Segmental and somatic dysfunction of cervical region: Secondary | ICD-10-CM | POA: Diagnosis not present

## 2018-09-02 DIAGNOSIS — M5033 Other cervical disc degeneration, cervicothoracic region: Secondary | ICD-10-CM | POA: Diagnosis not present

## 2018-09-21 DIAGNOSIS — M5033 Other cervical disc degeneration, cervicothoracic region: Secondary | ICD-10-CM | POA: Diagnosis not present

## 2018-09-21 DIAGNOSIS — M9901 Segmental and somatic dysfunction of cervical region: Secondary | ICD-10-CM | POA: Diagnosis not present

## 2018-09-28 DIAGNOSIS — M9901 Segmental and somatic dysfunction of cervical region: Secondary | ICD-10-CM | POA: Diagnosis not present

## 2018-09-28 DIAGNOSIS — M5033 Other cervical disc degeneration, cervicothoracic region: Secondary | ICD-10-CM | POA: Diagnosis not present

## 2018-09-30 IMAGING — MG DIGITAL SCREENING BILATERAL MAMMOGRAM WITH TOMO AND CAD
8 series · 8 of 24 positions shown · non-contrast
Comparison: Previous exam(s).

CLINICAL DATA: Screening.

EXAM:
DIGITAL SCREENING BILATERAL MAMMOGRAM WITH TOMO AND CAD

[R CC synth-2D]
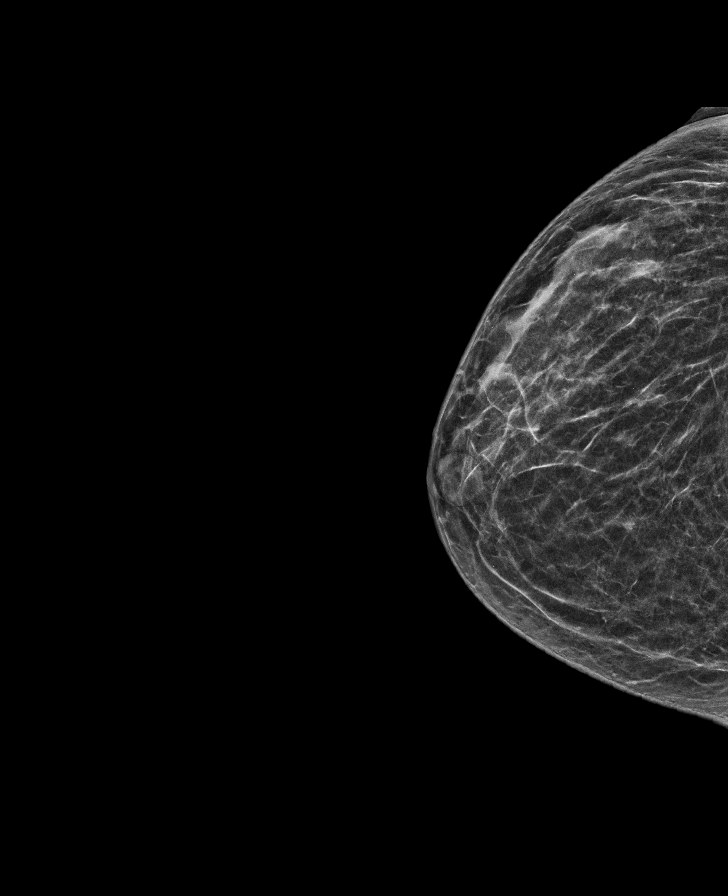

[L MLO synth-2D]
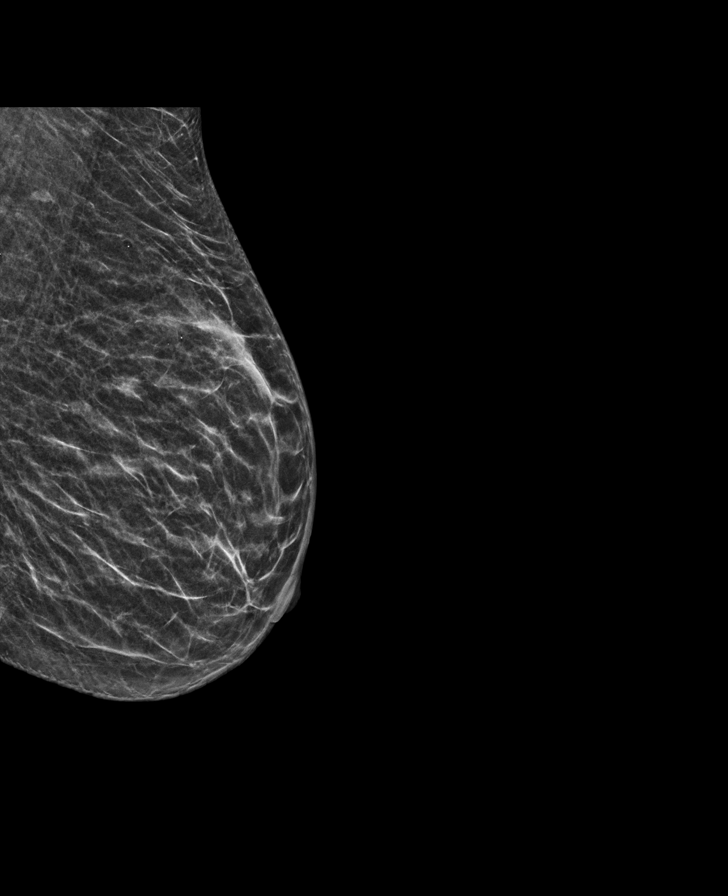

[L CC synth-2D]
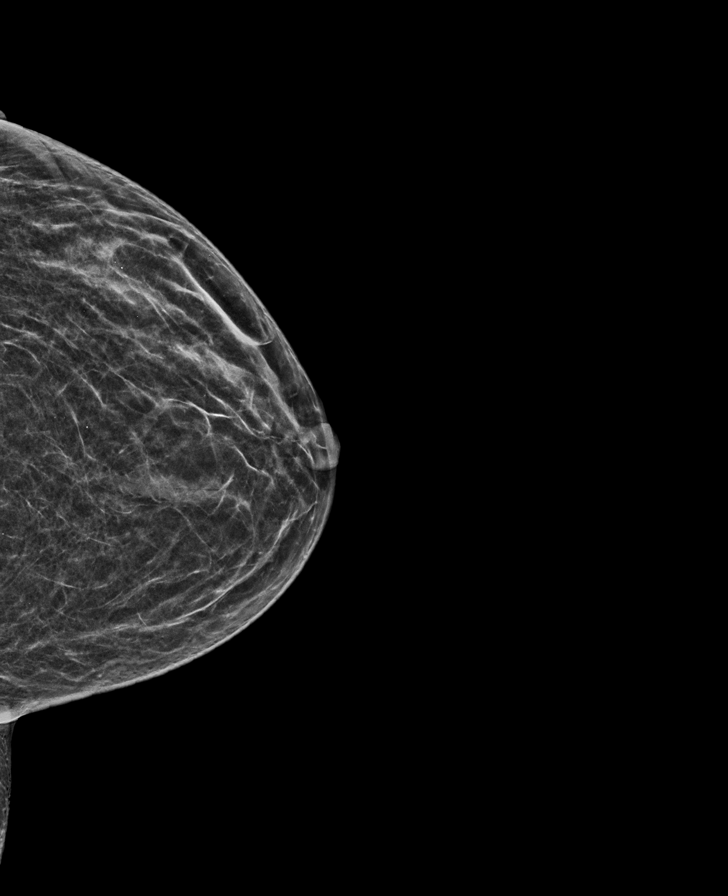

[R MLO synth-2D]
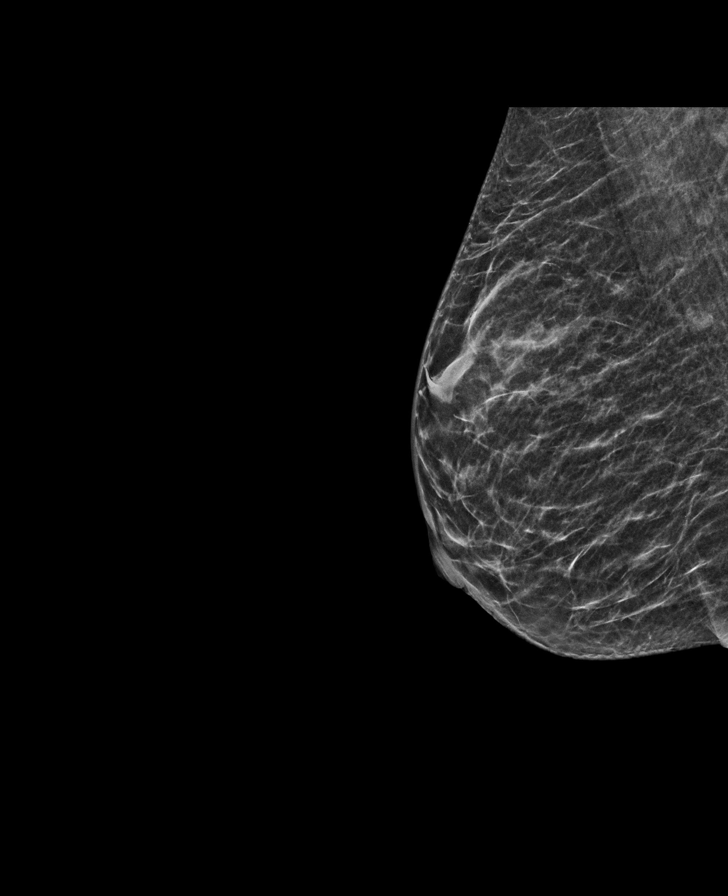

[R CC tomo · tomo slice 24/47.0]
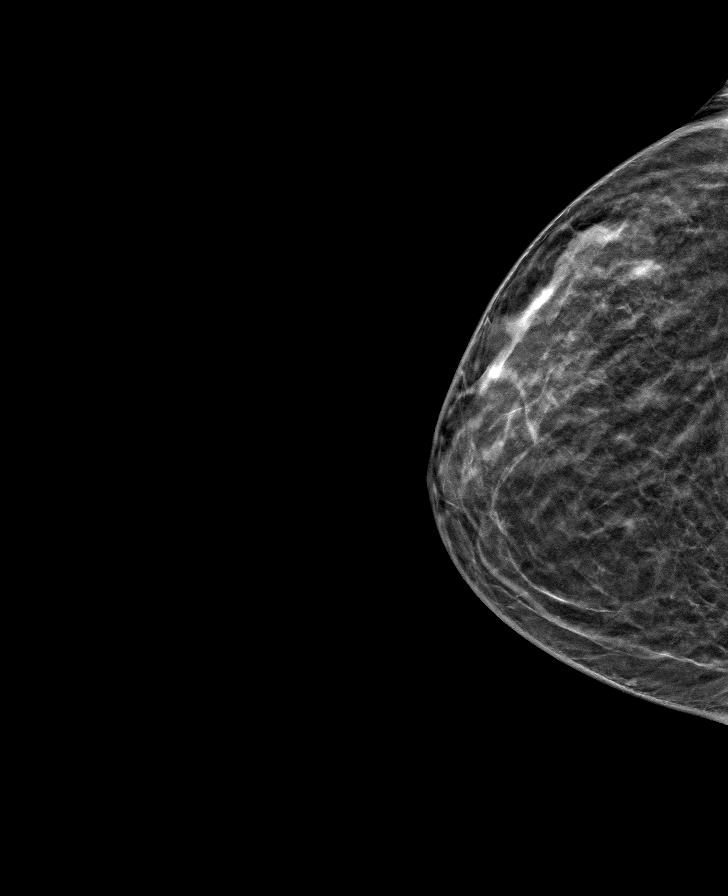

[L MLO tomo · tomo slice 23/45.0]
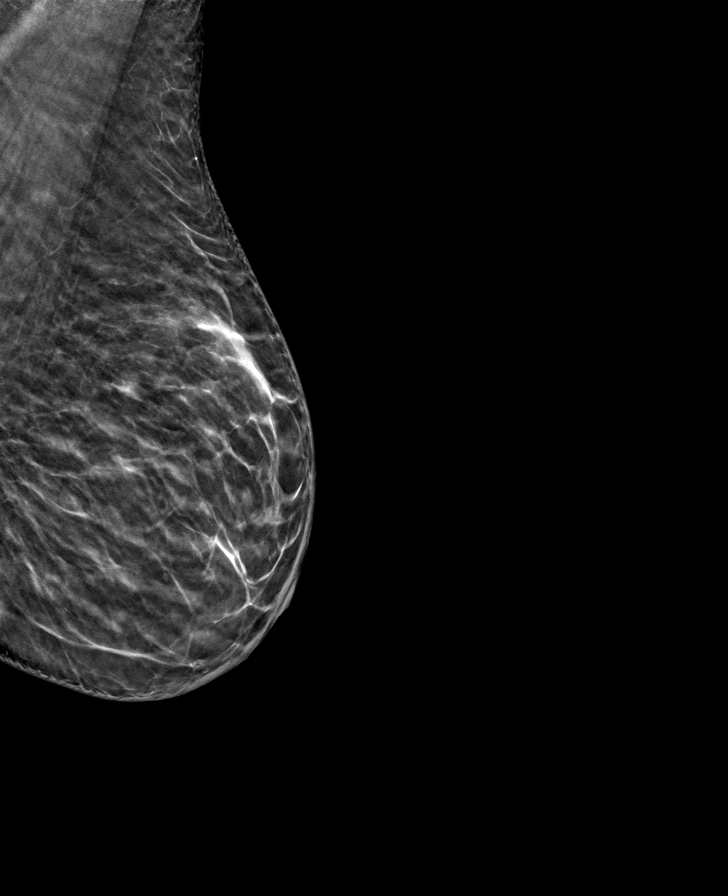

[R MLO tomo · tomo slice 23/45.0]
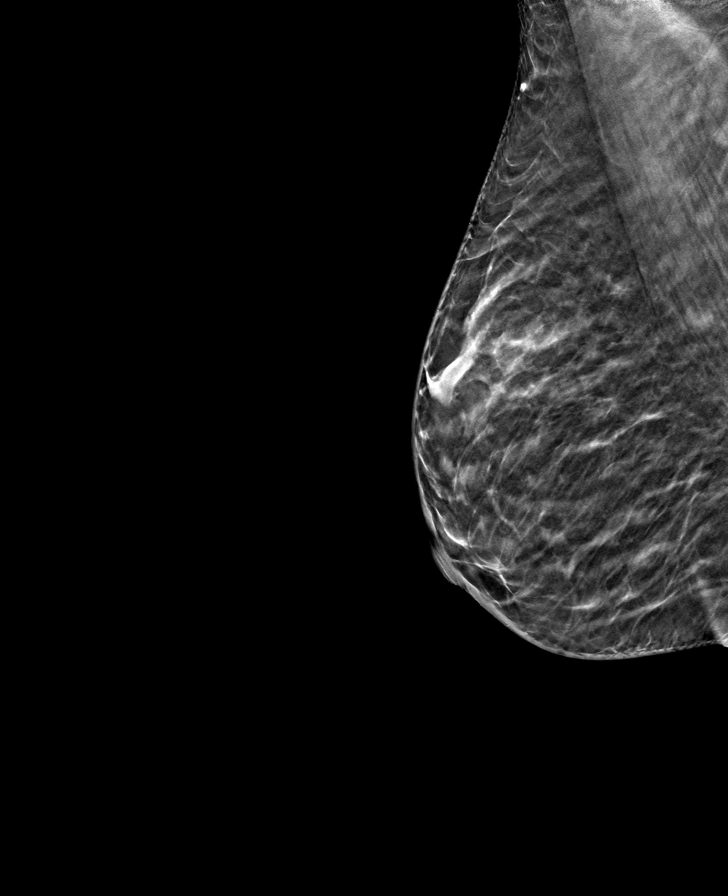

[L CC tomo · tomo slice 23/46.0]
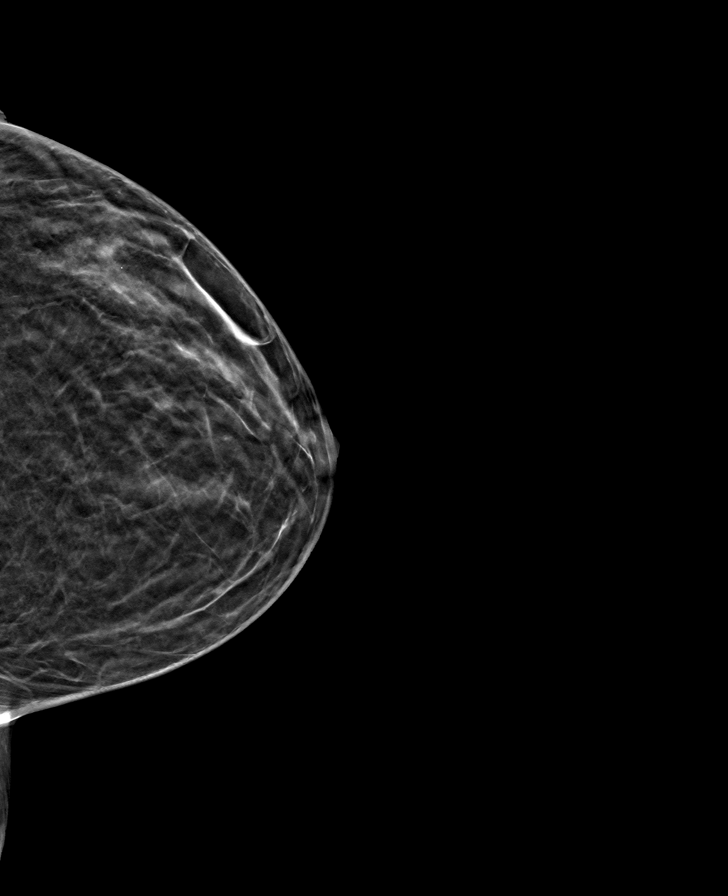

[8 of 24 positions shown; findings below may reference images not displayed]

ACR Breast Density Category c: The breast tissue is heterogeneously
dense, which may obscure small masses.
FINDINGS: There are no findings suspicious for malignancy. Images were
processed with CAD.
IMPRESSION: No mammographic evidence of malignancy. A result letter of this
screening mammogram will be mailed directly to the patient.

RECOMMENDATION:
Screening mammogram in one year. (Code:FT-U-LHB)

BI-RADS CATEGORY  1: Negative.

## 2018-10-05 ENCOUNTER — Telehealth: Payer: Self-pay | Admitting: Adult Health

## 2018-10-05 NOTE — Telephone Encounter (Signed)
Pt states that she may have a cyst that burst on her ovaries 2-3 days ago. States there is a lump and some discomfort. PT states this cyst the provider has been monitoring. She is wanting to see if she should come in for an appt.

## 2018-10-05 NOTE — Telephone Encounter (Signed)
Left message with Jenn's recommendations. [place]

## 2018-10-08 ENCOUNTER — Other Ambulatory Visit: Payer: Self-pay | Admitting: Adult Health

## 2018-10-08 DIAGNOSIS — Z8742 Personal history of other diseases of the female genital tract: Secondary | ICD-10-CM

## 2018-10-08 DIAGNOSIS — R102 Pelvic and perineal pain: Secondary | ICD-10-CM

## 2018-10-11 ENCOUNTER — Other Ambulatory Visit: Payer: Self-pay

## 2018-10-11 ENCOUNTER — Ambulatory Visit (INDEPENDENT_AMBULATORY_CARE_PROVIDER_SITE_OTHER): Payer: BC Managed Care – PPO

## 2018-10-11 DIAGNOSIS — Z8742 Personal history of other diseases of the female genital tract: Secondary | ICD-10-CM

## 2018-10-11 DIAGNOSIS — R102 Pelvic and perineal pain: Secondary | ICD-10-CM

## 2018-10-11 NOTE — Progress Notes (Signed)
PELVIC US TA/TV:homogeneous anteverted uterus,wnl,EEC 1.8 mm,normal right ovary,simple left ovarian cyst 5.4 x 3.3 x 4.7 cm,small amount of simple cul de sac fluid,right ovary appear mobile,left ovary is only visualized on transabdominal images

## 2018-10-12 DIAGNOSIS — M9901 Segmental and somatic dysfunction of cervical region: Secondary | ICD-10-CM | POA: Diagnosis not present

## 2018-10-12 DIAGNOSIS — M5033 Other cervical disc degeneration, cervicothoracic region: Secondary | ICD-10-CM | POA: Diagnosis not present

## 2018-10-13 DIAGNOSIS — M5033 Other cervical disc degeneration, cervicothoracic region: Secondary | ICD-10-CM | POA: Diagnosis not present

## 2018-10-13 DIAGNOSIS — M9901 Segmental and somatic dysfunction of cervical region: Secondary | ICD-10-CM | POA: Diagnosis not present

## 2018-11-11 ENCOUNTER — Other Ambulatory Visit: Payer: Self-pay | Admitting: Adult Health

## 2018-11-11 DIAGNOSIS — M9901 Segmental and somatic dysfunction of cervical region: Secondary | ICD-10-CM | POA: Diagnosis not present

## 2018-11-11 DIAGNOSIS — M5033 Other cervical disc degeneration, cervicothoracic region: Secondary | ICD-10-CM | POA: Diagnosis not present

## 2018-11-11 MED ORDER — LO LOESTRIN FE 1 MG-10 MCG / 10 MCG PO TABS: 1.0000 | ORAL_TABLET | Freq: Every day | ORAL | 4 refills | Status: DC

## 2018-11-11 NOTE — Telephone Encounter (Signed)
Pt is needing her birth control to be sent in to [facility] instead of the local pharmacy.

## 2018-11-11 NOTE — Addendum Note (Signed)
Addended by: Christiana Pellant A on: 11/11/2018 04:28 PM   Modules accepted: Orders

## 2018-11-24 DIAGNOSIS — M9901 Segmental and somatic dysfunction of cervical region: Secondary | ICD-10-CM | POA: Diagnosis not present

## 2018-11-24 DIAGNOSIS — M5033 Other cervical disc degeneration, cervicothoracic region: Secondary | ICD-10-CM | POA: Diagnosis not present

## 2018-12-09 DIAGNOSIS — M9901 Segmental and somatic dysfunction of cervical region: Secondary | ICD-10-CM | POA: Diagnosis not present

## 2018-12-09 DIAGNOSIS — M5033 Other cervical disc degeneration, cervicothoracic region: Secondary | ICD-10-CM | POA: Diagnosis not present

## 2018-12-22 DIAGNOSIS — M9901 Segmental and somatic dysfunction of cervical region: Secondary | ICD-10-CM | POA: Diagnosis not present

## 2018-12-22 DIAGNOSIS — M5033 Other cervical disc degeneration, cervicothoracic region: Secondary | ICD-10-CM | POA: Diagnosis not present

## 2019-01-12 DIAGNOSIS — M9901 Segmental and somatic dysfunction of cervical region: Secondary | ICD-10-CM | POA: Diagnosis not present

## 2019-01-12 DIAGNOSIS — M5033 Other cervical disc degeneration, cervicothoracic region: Secondary | ICD-10-CM | POA: Diagnosis not present

## 2019-01-14 ENCOUNTER — Other Ambulatory Visit (HOSPITAL_COMMUNITY): Payer: Self-pay | Admitting: Obstetrics and Gynecology

## 2019-01-14 DIAGNOSIS — Z1231 Encounter for screening mammogram for malignant neoplasm of breast: Secondary | ICD-10-CM

## 2019-01-17 ENCOUNTER — Other Ambulatory Visit: Payer: Self-pay

## 2019-01-17 ENCOUNTER — Ambulatory Visit (HOSPITAL_COMMUNITY)
Admission: RE | Admit: 2019-01-17 | Discharge: 2019-01-17 | Disposition: A | Discharge status: Home / Self Care | Payer: BC Managed Care – PPO | Source: Ambulatory Visit | Attending: Obstetrics and Gynecology | Admitting: Obstetrics and Gynecology

## 2019-01-17 DIAGNOSIS — Z1231 Encounter for screening mammogram for malignant neoplasm of breast: Secondary | ICD-10-CM | POA: Insufficient documentation

## 2019-01-19 DIAGNOSIS — M5033 Other cervical disc degeneration, cervicothoracic region: Secondary | ICD-10-CM | POA: Diagnosis not present

## 2019-01-19 DIAGNOSIS — M9901 Segmental and somatic dysfunction of cervical region: Secondary | ICD-10-CM | POA: Diagnosis not present

## 2019-01-26 DIAGNOSIS — M5033 Other cervical disc degeneration, cervicothoracic region: Secondary | ICD-10-CM | POA: Diagnosis not present

## 2019-01-26 DIAGNOSIS — M9901 Segmental and somatic dysfunction of cervical region: Secondary | ICD-10-CM | POA: Diagnosis not present

## 2019-02-10 DIAGNOSIS — M5033 Other cervical disc degeneration, cervicothoracic region: Secondary | ICD-10-CM | POA: Diagnosis not present

## 2019-02-10 DIAGNOSIS — M9901 Segmental and somatic dysfunction of cervical region: Secondary | ICD-10-CM | POA: Diagnosis not present

## 2019-02-27 ENCOUNTER — Encounter: Payer: Self-pay | Admitting: Emergency Medicine

## 2019-02-27 ENCOUNTER — Inpatient Hospital Stay
Admission: RE | Admit: 2019-02-27 | Discharge: 2019-02-27 | Disposition: A | Discharge status: Home / Self Care | Payer: BC Managed Care – PPO | Source: Ambulatory Visit

## 2019-02-27 ENCOUNTER — Ambulatory Visit
Admission: EM | Admit: 2019-02-27 | Discharge: 2019-02-27 | Disposition: A | Discharge status: Home / Self Care | Payer: BC Managed Care – PPO | Attending: Emergency Medicine | Admitting: Emergency Medicine

## 2019-02-27 ENCOUNTER — Other Ambulatory Visit: Payer: Self-pay

## 2019-02-27 DIAGNOSIS — N3001 Acute cystitis with hematuria: Secondary | ICD-10-CM | POA: Diagnosis not present

## 2019-02-27 DIAGNOSIS — B9689 Other specified bacterial agents as the cause of diseases classified elsewhere: Secondary | ICD-10-CM

## 2019-02-27 LAB — POCT URINALYSIS DIP (MANUAL ENTRY)
Bilirubin, UA: NEGATIVE
Glucose, UA: NEGATIVE mg/dL
Ketones, POC UA: NEGATIVE mg/dL
Nitrite, UA: NEGATIVE
Protein Ur, POC: NEGATIVE mg/dL
Spec Grav, UA: 1.01 (ref 1.010–1.025)
Urobilinogen, UA: 0.2 E.U./dL
pH, UA: 6.5 (ref 5.0–8.0)

## 2019-02-27 LAB — POCT URINE PREGNANCY: Preg Test, Ur: NEGATIVE

## 2019-02-27 MED ORDER — SULFAMETHOXAZOLE-TRIMETHOPRIM 800-160 MG PO TABS: 1.0000 | ORAL_TABLET | Freq: Two times a day (BID) | ORAL | 0 refills | Status: AC

## 2019-02-27 NOTE — ED Triage Notes (Signed)
Reports burning with urination, frequency, and fatigue since Thursday.

## 2019-02-27 NOTE — ED Provider Notes (Signed)
[facility]    CSN: [id number] Arrival date & time: 02/27/19  1514      History   Chief Complaint Chief Complaint  Patient presents with  . Dysuria    HPI Kristen Dunn is a 43 y.o. female with history of migraines presenting for burning with urination, urinary frequency, mild fatigue since Thursday.  Patient denies history of UTI.  Has not tried anything for symptoms.     Past Medical History:  Diagnosis Date  . AMA (advanced maternal age) multigravida 28+   . Anemia   . Asthma   . Degenerative lumbar disc   . Family history of adverse reaction to anesthesia    mother has nausea  . Gestational diabetes    glyburide  . Hx of migraines 10/26/2012  . Other and unspecified ovarian cyst 03/30/2013  . PONV (postoperative nausea and vomiting)     Patient Active Problem List   Diagnosis Date Noted  . Migraine without aura and without status migrainosus, not intractable 10/27/2017  . Encounter for menstrual regulation 10/27/2017  . Encounter for gynecological examination with Papanicolaou smear of cervix 10/27/2017  . Screening for colorectal cancer 10/27/2017  . Musculoskeletal pain 08/21/2015  . Superficial thrombophlebitis in puerperium 08/06/2015  . Status post repeat low transverse cesarean section 07/30/2015  . Consultation for female sterilization 07/17/2015  . Left ovarian cyst 03/12/2015  . Rubella non-immune status, antepartum 11/08/2013  . Other and unspecified ovarian cyst 03/30/2013  . Internal hernia 03/15/2013  . Abdominal pain 03/14/2013  . Hx of migraines 10/26/2012    Past Surgical History:  Procedure Laterality Date  . CESAREAN SECTION  01/13/2012   Procedure: CESAREAN SECTION;  Surgeon: Mora Bellman, MD;  Location: [facility];  Service: Obstetrics;  Laterality: N/A;  . CESAREAN SECTION WITH BILATERAL TUBAL LIGATION Bilateral 07/30/2015   Procedure: REPEAT CESAREAN SECTION WITH BILATERAL TUBAL LIGATION, WIDE EXCISION OF CICHEIX;  Surgeon:  Jonnie Kind, MD;  Location: [facility];  Service: Obstetrics;  Laterality: Bilateral;  . Gastric Bypass 2008    . INCISIONAL HERNIA REPAIR  03/31/2012   Procedure: LAPAROSCOPIC INCISIONAL HERNIA;  Surgeon: Edward Jolly, MD;  Location: [facility];  Service: General;  Laterality: N/A;  LAPAROSCOPY REPAIR INTERNAL HERNIA  petersons defect  . LAPAROSCOPIC GASTRIC BANDING WITH HIATAL HERNIA REPAIR N/A 03/14/2013   Procedure: DIAGNOSTIC LAPAROSCOPY  WITH  INTERNAL HERNIA  REDUCTION WITH CLOSURE;  Surgeon: Shann Medal, MD;  Location: [facility];  Service: General;  Laterality: N/A;  . TONSILLECTOMY      OB History    Gravida  3   Para  2   Term  2   Preterm  0   AB  1   Living  2     SAB  1   TAB  0   Ectopic  0   Multiple  0   Live Births  2            Home Medications    Prior to Admission medications   Medication Sig Start Date End Date Taking? Authorizing Provider  ALBUTEROL IN Inhale into the lungs as needed.   Yes [provider]  IRON PO Take by mouth daily.   Yes [provider]  Norethindrone-Ethinyl Estradiol-Fe Biphas (LO LOESTRIN FE) 1 MG-10 MCG / 10 MCG tablet Take 1 tablet by mouth daily. Take 1 daily by mouth 11/11/18  Yes Derrek Monaco A, NP  SUMAtriptan (IMITREX) 100 MG tablet Take 1 tablet (100  mg total) by mouth every 2 (two) hours as needed for migraine. May repeat in 2 hours if headache persists or recurs. 05/05/18  Yes Estill Dooms, NP  acetaminophen (TYLENOL) 325 MG tablet Take 650 mg by mouth every 6 (six) hours as needed.    [provider]  Dextromethorphan HBr 15 MG/15ML LIQD Take 20 mLs (20 mg total) by mouth every 6 (six) hours as needed. 03/17/18   Tasia Catchings, Amy V, PA-C  predniSONE (DELTASONE) 50 MG tablet Take 1 tablet (50 mg total) by mouth daily. 03/17/18   Ok Edwards, PA-C  Prenatal Vit-Fe Fumarate-FA (PRENATAL VITAMIN PO) Take 1 tablet by mouth daily.     [provider]   sulfamethoxazole-trimethoprim (BACTRIM DS) 800-160 MG tablet Take 1 tablet by mouth 2 (two) times daily for 3 days. 02/27/19 03/02/19  Hall-Potvin, [place], PA-C    Family History Family History  Problem Relation Age of Onset  . COPD Mother   . Asthma Mother   . Hypertension Father   . Aneurysm Father   . Asthma Sister   . Thyroid disease Maternal Grandmother   . Stroke Maternal Grandmother   . Cancer Maternal Aunt        breast  . Breast cancer Maternal Aunt   . Von Willebrand disease Cousin     Social History Social History   Tobacco Use  . Smoking status: Never Smoker  . Smokeless tobacco: Never Used  Substance Use Topics  . Alcohol use: No  . Drug use: No     Allergies   Penicillins, Aspirin, Alupent [metaproterenol], Lactose intolerance (gi), and Latex   Review of Systems Review of Systems  Constitutional: Negative for fatigue and fever.  Respiratory: Negative for cough and shortness of breath.   Cardiovascular: Negative for chest pain and palpitations.  Gastrointestinal: Negative for constipation and diarrhea.  Genitourinary: Positive for dysuria, frequency and urgency. Negative for flank pain, hematuria, pelvic pain, vaginal bleeding, vaginal discharge and vaginal pain.     Physical Exam Triage Vital Signs ED Triage Vitals  Enc Vitals Group     BP      Pulse      Resp      Temp      Temp src      SpO2      Weight      Height      Head Circumference      Peak Flow      Pain Score      Pain Loc      Pain Edu?      Excl. in [place]?    No data found.  Updated Vital Signs BP 121/81   Pulse 81   Temp 98 F (36.7 C) (Oral)   Resp 16   SpO2 95%   Visual Acuity Right Eye Distance:   Left Eye Distance:   Bilateral Distance:    Right Eye Near:   Left Eye Near:    Bilateral Near:     Physical Exam Constitutional:      General: She is not in acute distress. HENT:     Head: Normocephalic and atraumatic.  Eyes:     General: No scleral  icterus.    Pupils: Pupils are equal, round, and reactive to light.  Cardiovascular:     Rate and Rhythm: Normal rate.  Pulmonary:     Effort: Pulmonary effort is normal.  Abdominal:     General: Bowel sounds are normal.     Palpations:  Abdomen is soft.     Tenderness: There is no abdominal tenderness. There is no right CVA tenderness, left CVA tenderness or guarding.  Skin:    Coloration: Skin is not jaundiced or pale.  Neurological:     Mental Status: She is alert and oriented to person, place, and time.      UC Treatments / Results  Labs (all labs ordered are listed, but only abnormal results are displayed) Labs Reviewed  POCT URINALYSIS DIP (MANUAL ENTRY) - Abnormal; Notable for the following components:      Result Value   Blood, UA trace-lysed (*)    Leukocytes, UA Trace (*)    All other components within normal limits  POCT URINE PREGNANCY - Normal  URINE CULTURE    EKG   Radiology No results found.  Procedures Procedures (including critical care time)  Medications Ordered in UC Medications - No data to display  Initial Impression / Assessment and Plan / UC Course  I have reviewed the triage vital signs and the nursing notes.  Pertinent labs & imaging results that were available during my care of the patient were reviewed by me and considered in my medical decision making (see chart for details).    POCT urine dipstick done in office, reviewed by me: Positive for trace-lysed blood, trace leukocytes: Culture pending.  Will initiate Bactrim today as patient has anaphylaxis to penicillins.  Return precautions discussed, patient verbalized understanding and is agreeable to plan. Final Clinical Impressions(s) / UC Diagnoses   Final diagnoses:  Acute cystitis with hematuria     Discharge Instructions     Take antibiotic as prescribed with food. Culture pending - we will let you know if we need to change the antibiotic. Return for persistent/worsening  symptoms, pelvic/abdominal/back pain, fever.    ED Prescriptions    Medication Sig Dispense Auth. Provider   sulfamethoxazole-trimethoprim (BACTRIM DS) 800-160 MG tablet Take 1 tablet by mouth 2 (two) times daily for 3 days. 6 tablet Hall-Potvin, [place], PA-C     PDMP not reviewed this encounter.   Hall-Potvin, [place], [place] 02/27/19 1635

## 2019-02-27 NOTE — Discharge Instructions (Signed)
Take antibiotic as prescribed with food. Culture pending - we will let you know if we need to change the antibiotic. Return for persistent/worsening symptoms, pelvic/abdominal/back pain, fever.

## 2019-03-02 DIAGNOSIS — M9901 Segmental and somatic dysfunction of cervical region: Secondary | ICD-10-CM | POA: Diagnosis not present

## 2019-03-02 DIAGNOSIS — M5033 Other cervical disc degeneration, cervicothoracic region: Secondary | ICD-10-CM | POA: Diagnosis not present

## 2019-03-03 ENCOUNTER — Telehealth (HOSPITAL_COMMUNITY): Payer: Self-pay | Admitting: Emergency Medicine

## 2019-03-03 LAB — URINE CULTURE: Culture: >=100000 — AB

## 2019-03-03 NOTE — Telephone Encounter (Signed)
Urine culture was positive for KLEBSIELLA PNEUMONIAE and was given  Bactrim at urgent care visit.   Attempted to reach patient. No answer at this time.

## 2019-03-24 DIAGNOSIS — M5033 Other cervical disc degeneration, cervicothoracic region: Secondary | ICD-10-CM | POA: Diagnosis not present

## 2019-03-24 DIAGNOSIS — M9901 Segmental and somatic dysfunction of cervical region: Secondary | ICD-10-CM | POA: Diagnosis not present

## 2019-04-05 DIAGNOSIS — M9901 Segmental and somatic dysfunction of cervical region: Secondary | ICD-10-CM | POA: Diagnosis not present

## 2019-04-05 DIAGNOSIS — M5033 Other cervical disc degeneration, cervicothoracic region: Secondary | ICD-10-CM | POA: Diagnosis not present

## 2019-04-28 DIAGNOSIS — M9901 Segmental and somatic dysfunction of cervical region: Secondary | ICD-10-CM | POA: Diagnosis not present

## 2019-04-28 DIAGNOSIS — M5033 Other cervical disc degeneration, cervicothoracic region: Secondary | ICD-10-CM | POA: Diagnosis not present

## 2019-05-17 DIAGNOSIS — M9901 Segmental and somatic dysfunction of cervical region: Secondary | ICD-10-CM | POA: Diagnosis not present

## 2019-05-17 DIAGNOSIS — M5033 Other cervical disc degeneration, cervicothoracic region: Secondary | ICD-10-CM | POA: Diagnosis not present

## 2019-06-14 DIAGNOSIS — M9901 Segmental and somatic dysfunction of cervical region: Secondary | ICD-10-CM | POA: Diagnosis not present

## 2019-06-14 DIAGNOSIS — M5033 Other cervical disc degeneration, cervicothoracic region: Secondary | ICD-10-CM | POA: Diagnosis not present

## 2019-06-16 DIAGNOSIS — E7849 Other hyperlipidemia: Secondary | ICD-10-CM | POA: Diagnosis not present

## 2019-06-16 DIAGNOSIS — D513 Other dietary vitamin B12 deficiency anemia: Secondary | ICD-10-CM | POA: Diagnosis not present

## 2019-06-16 DIAGNOSIS — I110 Hypertensive heart disease with heart failure: Secondary | ICD-10-CM | POA: Diagnosis not present

## 2019-06-16 DIAGNOSIS — E559 Vitamin D deficiency, unspecified: Secondary | ICD-10-CM | POA: Diagnosis not present

## 2019-06-16 DIAGNOSIS — D510 Vitamin B12 deficiency anemia due to intrinsic factor deficiency: Secondary | ICD-10-CM | POA: Diagnosis not present

## 2019-06-28 DIAGNOSIS — M9901 Segmental and somatic dysfunction of cervical region: Secondary | ICD-10-CM | POA: Diagnosis not present

## 2019-06-28 DIAGNOSIS — M5033 Other cervical disc degeneration, cervicothoracic region: Secondary | ICD-10-CM | POA: Diagnosis not present

## 2019-07-12 ENCOUNTER — Telehealth: Payer: Self-pay | Admitting: *Deleted

## 2019-07-12 MED ORDER — NORETHIN ACE-ETH ESTRAD-FE 1-20 MG-MCG PO TABS: 1.0000 | ORAL_TABLET | Freq: Every day | ORAL | 4 refills | Status: DC

## 2019-07-12 NOTE — Telephone Encounter (Signed)
Will rx junel 1/20, use condoms for 1 pack

## 2019-07-12 NOTE — Telephone Encounter (Signed)
Pt left message that her insurance informed her that they will no longer cover the loestrin. [facility]

## 2019-07-14 DIAGNOSIS — M5033 Other cervical disc degeneration, cervicothoracic region: Secondary | ICD-10-CM | POA: Diagnosis not present

## 2019-07-14 DIAGNOSIS — M9901 Segmental and somatic dysfunction of cervical region: Secondary | ICD-10-CM | POA: Diagnosis not present

## 2019-07-21 DIAGNOSIS — Z23 Encounter for immunization: Secondary | ICD-10-CM | POA: Diagnosis not present

## 2019-08-04 DIAGNOSIS — M9901 Segmental and somatic dysfunction of cervical region: Secondary | ICD-10-CM | POA: Diagnosis not present

## 2019-08-04 DIAGNOSIS — M5033 Other cervical disc degeneration, cervicothoracic region: Secondary | ICD-10-CM | POA: Diagnosis not present

## 2019-08-10 DIAGNOSIS — M5033 Other cervical disc degeneration, cervicothoracic region: Secondary | ICD-10-CM | POA: Diagnosis not present

## 2019-08-10 DIAGNOSIS — M9901 Segmental and somatic dysfunction of cervical region: Secondary | ICD-10-CM | POA: Diagnosis not present

## 2019-08-12 DIAGNOSIS — Z23 Encounter for immunization: Secondary | ICD-10-CM | POA: Diagnosis not present

## 2019-10-06 DIAGNOSIS — M9901 Segmental and somatic dysfunction of cervical region: Secondary | ICD-10-CM | POA: Diagnosis not present

## 2019-10-06 DIAGNOSIS — M5033 Other cervical disc degeneration, cervicothoracic region: Secondary | ICD-10-CM | POA: Diagnosis not present

## 2019-10-18 DIAGNOSIS — M5033 Other cervical disc degeneration, cervicothoracic region: Secondary | ICD-10-CM | POA: Diagnosis not present

## 2019-10-18 DIAGNOSIS — M9901 Segmental and somatic dysfunction of cervical region: Secondary | ICD-10-CM | POA: Diagnosis not present

## 2019-11-03 DIAGNOSIS — M5033 Other cervical disc degeneration, cervicothoracic region: Secondary | ICD-10-CM | POA: Diagnosis not present

## 2019-11-03 DIAGNOSIS — M9901 Segmental and somatic dysfunction of cervical region: Secondary | ICD-10-CM | POA: Diagnosis not present

## 2019-11-24 DIAGNOSIS — M5033 Other cervical disc degeneration, cervicothoracic region: Secondary | ICD-10-CM | POA: Diagnosis not present

## 2019-11-24 DIAGNOSIS — M9901 Segmental and somatic dysfunction of cervical region: Secondary | ICD-10-CM | POA: Diagnosis not present

## 2019-12-07 DIAGNOSIS — M5033 Other cervical disc degeneration, cervicothoracic region: Secondary | ICD-10-CM | POA: Diagnosis not present

## 2019-12-07 DIAGNOSIS — M9901 Segmental and somatic dysfunction of cervical region: Secondary | ICD-10-CM | POA: Diagnosis not present

## 2020-01-02 ENCOUNTER — Other Ambulatory Visit: Payer: Self-pay

## 2020-01-02 ENCOUNTER — Encounter: Payer: Self-pay | Admitting: Adult Health

## 2020-01-02 ENCOUNTER — Ambulatory Visit (INDEPENDENT_AMBULATORY_CARE_PROVIDER_SITE_OTHER): Payer: BC Managed Care – PPO | Admitting: Adult Health

## 2020-01-02 VITALS — BP 142/82 | HR 72 | Ht 65.0 in | Wt 189.0 lb

## 2020-01-02 DIAGNOSIS — Z01419 Encounter for gynecological examination (general) (routine) without abnormal findings: Secondary | ICD-10-CM

## 2020-01-02 DIAGNOSIS — Z1211 Encounter for screening for malignant neoplasm of colon: Secondary | ICD-10-CM

## 2020-01-02 DIAGNOSIS — Z7689 Persons encountering health services in other specified circumstances: Secondary | ICD-10-CM

## 2020-01-02 DIAGNOSIS — G43009 Migraine without aura, not intractable, without status migrainosus: Secondary | ICD-10-CM | POA: Diagnosis not present

## 2020-01-02 LAB — HEMOCCULT GUIAC POC 1CARD (OFFICE): Fecal Occult Blood, POC: NEGATIVE

## 2020-01-02 NOTE — Progress Notes (Signed)
Patient ID: Kristen Dunn, female   DOB: [birth year], 44 y.o.   MRN: [id number] History of Present Illness: Kristen Dunn is a 44 year old white female,married, [id number], in for a well woman gyn exam, she had normal pap with negative HPV 10/27/17.    Current Medications, Allergies, Past Medical History, Past Surgical History, Family History and Social History were reviewed in [employer] record.     Review of Systems: Patient denies any headaches, hearing loss, fatigue, blurred vision, shortness of breath, chest pain, abdominal pain, problems with bowel movements, urination, or intercourse. No joint pain or mood swings.periods good on OCs, except after COVID vaccine had bleeding for a month.     Physical Exam:BP (!) 142/82 (BP Location: Left Arm, Patient Position: Sitting, Cuff Size: Normal)   Pulse 72   Ht 5\' 5"  (1.651 m)   Wt 189 lb (85.7 kg)   BMI 31.45 kg/m  General:  Well developed, well nourished, no acute distress Skin:  Warm and dry Neck:  Midline trachea, normal thyroid, good ROM, no lymphadenopathy Lungs; Clear to auscultation bilaterally Breast:  No dominant palpable mass, retraction, or nipple discharge Cardiovascular: Regular rate and rhythm Abdomen:  Soft, non tender, no hepatosplenomegaly Pelvic:  External genitalia is normal in appearance, no lesions.  The vagina is normal in appearance. Urethra has no lesions or masses. The cervix is bulbous.  Uterus is felt to be normal size, shape, and contour.  No adnexal masses or tenderness noted.Bladder is non tender, no masses felt. Rectal: Good sphincter tone, no polyps, or hemorrhoids felt.  Hemoccult negative. Extremities/musculoskeletal:  No swelling or varicosities noted, no clubbing or cyanosis Psych:  No mood changes, alert and cooperative,seems happy AA is 2 Fall risk is low PHQ 9 score is 1  [place] - 01/02/20 1532      Pregnancy Intention Screening   Does the patient want to become pregnant in the next  year? No    Does the patient's partner want to become pregnant in the next year? No    Would the patient like to discuss contraceptive options today? N/A      Contraception Wrap Up   Current Method Female Sterilization    End Method Female Sterilization    Contraception Counseling Provided No         Examination chaperoned by Dwyane Dee LPN.  Impression and Plan: 1. Encounter for well woman exam with routine gynecological exam Pap and physical in 1 year Mammogram yearly Colonoscopy at 45-50  2. Encounter for screening fecal occult blood testing   3. Encounter for menstrual regulation Continue OCs has refills on Loestrin 1/20   4. Migraine without aura and without status migrainosus, not intractable Has Imitrex if needed

## 2020-01-05 DIAGNOSIS — M5033 Other cervical disc degeneration, cervicothoracic region: Secondary | ICD-10-CM | POA: Diagnosis not present

## 2020-01-05 DIAGNOSIS — M9901 Segmental and somatic dysfunction of cervical region: Secondary | ICD-10-CM | POA: Diagnosis not present

## 2020-01-19 DIAGNOSIS — M5033 Other cervical disc degeneration, cervicothoracic region: Secondary | ICD-10-CM | POA: Diagnosis not present

## 2020-01-19 DIAGNOSIS — M9901 Segmental and somatic dysfunction of cervical region: Secondary | ICD-10-CM | POA: Diagnosis not present

## 2020-02-08 DIAGNOSIS — M9901 Segmental and somatic dysfunction of cervical region: Secondary | ICD-10-CM | POA: Diagnosis not present

## 2020-02-08 DIAGNOSIS — M5033 Other cervical disc degeneration, cervicothoracic region: Secondary | ICD-10-CM | POA: Diagnosis not present

## 2020-02-16 DIAGNOSIS — M9901 Segmental and somatic dysfunction of cervical region: Secondary | ICD-10-CM | POA: Diagnosis not present

## 2020-02-16 DIAGNOSIS — M5033 Other cervical disc degeneration, cervicothoracic region: Secondary | ICD-10-CM | POA: Diagnosis not present

## 2020-02-29 ENCOUNTER — Other Ambulatory Visit (HOSPITAL_COMMUNITY): Payer: Self-pay | Admitting: Adult Health

## 2020-02-29 DIAGNOSIS — Z1231 Encounter for screening mammogram for malignant neoplasm of breast: Secondary | ICD-10-CM

## 2020-03-05 ENCOUNTER — Other Ambulatory Visit: Payer: Self-pay

## 2020-03-05 ENCOUNTER — Ambulatory Visit (HOSPITAL_COMMUNITY)
Admission: RE | Admit: 2020-03-05 | Discharge: 2020-03-05 | Disposition: A | Discharge status: Home / Self Care | Payer: BC Managed Care – PPO | Source: Ambulatory Visit | Attending: Women's Health | Admitting: Women's Health

## 2020-03-05 DIAGNOSIS — Z1231 Encounter for screening mammogram for malignant neoplasm of breast: Secondary | ICD-10-CM | POA: Insufficient documentation

## 2020-03-06 DIAGNOSIS — M545 Low back pain, unspecified: Secondary | ICD-10-CM | POA: Diagnosis not present

## 2020-03-06 DIAGNOSIS — M5136 Other intervertebral disc degeneration, lumbar region: Secondary | ICD-10-CM | POA: Diagnosis not present

## 2020-03-08 DIAGNOSIS — M9901 Segmental and somatic dysfunction of cervical region: Secondary | ICD-10-CM | POA: Diagnosis not present

## 2020-03-08 DIAGNOSIS — M5033 Other cervical disc degeneration, cervicothoracic region: Secondary | ICD-10-CM | POA: Diagnosis not present

## 2020-03-12 DIAGNOSIS — M545 Low back pain, unspecified: Secondary | ICD-10-CM | POA: Diagnosis not present

## 2020-03-19 DIAGNOSIS — M545 Low back pain, unspecified: Secondary | ICD-10-CM | POA: Diagnosis not present

## 2020-03-22 DIAGNOSIS — M9901 Segmental and somatic dysfunction of cervical region: Secondary | ICD-10-CM | POA: Diagnosis not present

## 2020-03-22 DIAGNOSIS — M5033 Other cervical disc degeneration, cervicothoracic region: Secondary | ICD-10-CM | POA: Diagnosis not present

## 2020-04-02 DIAGNOSIS — M5416 Radiculopathy, lumbar region: Secondary | ICD-10-CM | POA: Diagnosis not present

## 2020-04-19 DIAGNOSIS — M5416 Radiculopathy, lumbar region: Secondary | ICD-10-CM | POA: Diagnosis not present

## 2020-05-14 DIAGNOSIS — M5416 Radiculopathy, lumbar region: Secondary | ICD-10-CM | POA: Diagnosis not present

## 2020-05-17 DIAGNOSIS — M9901 Segmental and somatic dysfunction of cervical region: Secondary | ICD-10-CM | POA: Diagnosis not present

## 2020-05-17 DIAGNOSIS — M5033 Other cervical disc degeneration, cervicothoracic region: Secondary | ICD-10-CM | POA: Diagnosis not present

## 2020-05-30 DIAGNOSIS — M5416 Radiculopathy, lumbar region: Secondary | ICD-10-CM | POA: Diagnosis not present

## 2020-06-12 ENCOUNTER — Other Ambulatory Visit: Payer: Self-pay

## 2020-06-12 ENCOUNTER — Ambulatory Visit (HOSPITAL_COMMUNITY): Payer: BC Managed Care – PPO | Attending: Physical Medicine & Rehabilitation

## 2020-06-12 DIAGNOSIS — G8929 Other chronic pain: Secondary | ICD-10-CM | POA: Insufficient documentation

## 2020-06-12 DIAGNOSIS — M5416 Radiculopathy, lumbar region: Secondary | ICD-10-CM | POA: Insufficient documentation

## 2020-06-12 DIAGNOSIS — M6281 Muscle weakness (generalized): Secondary | ICD-10-CM | POA: Diagnosis not present

## 2020-06-12 DIAGNOSIS — M5441 Lumbago with sciatica, right side: Secondary | ICD-10-CM | POA: Diagnosis not present

## 2020-06-12 NOTE — Therapy (Signed)
[place], [place], [address] Phone: [phone]   Fax:  [phone]  Physical Therapy Evaluation  Patient Details  Name: Kristen Dunn MRN: [id number] Date of Birth: 11/23/1975 Referring Provider (PT): Doreatha Martin, MD   Encounter Date: 06/12/2020   PT End of Session - 06/12/20 1201    Visit Number 1    Number of Visits 12    Date for PT Re-Evaluation 07/24/20    Authorization Type [employer] PPO, no auth, 30 VL    Authorization - Visit Number 1    Authorization - Number of Visits 30    Progress Note Due on Visit 10    PT Start Time 1115    PT Stop Time 1157    PT Time Calculation (min) 42 min    Activity Tolerance Patient tolerated treatment well           Past Medical History:  Diagnosis Date  . AMA (advanced maternal age) multigravida 74+   . Anemia   . Asthma   . Degenerative lumbar disc   . Family history of adverse reaction to anesthesia    mother has nausea  . Gestational diabetes    glyburide  . Hx of migraines 10/26/2012  . Other and unspecified ovarian cyst 03/30/2013  . PONV (postoperative nausea and vomiting)     Past Surgical History:  Procedure Laterality Date  . CESAREAN SECTION  01/13/2012   Procedure: CESAREAN SECTION;  Surgeon: Mora Bellman, MD;  Location: [facility];  Service: Obstetrics;  Laterality: N/A;  . CESAREAN SECTION WITH BILATERAL TUBAL LIGATION Bilateral 07/30/2015   Procedure: REPEAT CESAREAN SECTION WITH BILATERAL TUBAL LIGATION, WIDE EXCISION OF CICHEIX;  Surgeon: Jonnie Kind, MD;  Location: [facility];  Service: Obstetrics;  Laterality: Bilateral;  . Gastric Bypass 2008    . INCISIONAL HERNIA REPAIR  03/31/2012   Procedure: LAPAROSCOPIC INCISIONAL HERNIA;  Surgeon: Edward Jolly, MD;  Location: [facility];  Service: General;  Laterality: N/A;  LAPAROSCOPY REPAIR INTERNAL HERNIA  petersons defect  . LAPAROSCOPIC GASTRIC BANDING WITH HIATAL HERNIA REPAIR N/A  03/14/2013   Procedure: DIAGNOSTIC LAPAROSCOPY  WITH  INTERNAL HERNIA  REDUCTION WITH CLOSURE;  Surgeon: Shann Medal, MD;  Location: [facility];  Service: General;  Laterality: N/A;  . TONSILLECTOMY      There were no vitals filed for this visit.    Subjective Assessment - 06/12/20 1114    Subjective Pt reports 20 year hx of back pain and new onset of BLE radiating pain and worse pain noted at night.  Recent increase in back and leg pain with report of sharp pains shooting down the RLE and dull ache on the LLE. Pt notes shooting pain from right low back extending along posterior-lateral thigh to dorsum of right foot    Currently in Pain? Yes    Pain Score 6     Pain Location Back    Pain Orientation Lower;Right    Pain Descriptors / Indicators Aching;Shooting    Pain Type Chronic pain    Pain Onset More than a month ago    Pain Frequency Intermittent    Multiple Pain Sites Yes    Pain Score 6    Pain Location Leg    Pain Orientation Right    Pain Descriptors / Indicators Sharp;Shooting    Pain Type Chronic pain    Pain Onset More than a month ago    Pain Frequency Intermittent  [facility] PT Assessment - 06/12/20 0001      Assessment   Medical Diagnosis lumbar radiculopathy    Referring Provider (PT) Doreatha Martin, MD    Prior Therapy 20 years ago      Balance Screen   Has the patient fallen in the past 6 months Yes    How many times? 1    Has the patient had a decrease in activity level because of a fear of falling?  No    Is the patient reluctant to leave their home because of a fear of falling?  No      Home Ecologist residence      Prior Function   Level of Independence Independent    Vocation Full time employment    Vocation Requirements computer work    Leisure two children      Observation/Other Assessments   Focus on Therapeutic Outcomes (FOTO)  55% function      ROM / Strength   AROM / PROM / Strength  AROM;Strength;PROM      AROM   AROM Assessment Site Lumbar    Lumbar Flexion 10% limited    Lumbar Extension 25% limited    Lumbar - Right Side Bend 25% limited    Lumbar - Left Side Bend WNL    Lumbar - Right Rotation WNL    Lumbar - Left Rotation 25% limited      Strength   Strength Assessment Site Lumbar;Hip    Right/Left Hip Right;Left    Right Hip Flexion 4/5    Right Hip Extension 2+/5    Right Hip ABduction 3-/5    Left Hip Flexion 4/5    Left Hip Extension 3-/5    Left Hip ABduction 3-/5    Lumbar Flexion 2+/5    Lumbar Extension 2+/5      Palpation   Spinal mobility decreased mobility appreciated lumbar PA    Palpation comment very little muscle contraction appreciated in lumbar paraspinals and multifidi during mobility and mm testing      Special Tests    Special Tests Lumbar      Ambulation/Gait   Ambulation/Gait Yes    Ambulation/Gait Assistance 7: Independent                      Objective measurements completed on examination: See above findings.       [facility] Adult PT Treatment/Exercise - 06/12/20 0001      Exercises   Exercises Lumbar      Lumbar Exercises: Supine   Ab Set 20 reps;2 seconds      Lumbar Exercises: Prone   Other Prone Lumbar Exercises prone x 5-7 min with 1 pillow support                  PT Education - 06/12/20 1201    Education Details Pt education in assesment findings and treatment rationale as well as key items for symptom monitoring    Person(s) Educated Patient    Methods Explanation    Comprehension Verbalized understanding            PT Short Term Goals - 06/12/20 1205      PT SHORT TERM GOAL #1   Title Patient will report at least 25% improvement in symptoms for improved quality of life with centralization of RLE symptoms    Time 3    Period Weeks    Status New    Target Date 07/03/20  PT SHORT TERM GOAL #2   Title Patient will be independent with HEP in order to improve functional  outcomes.    Time 3    Period Weeks    Status New    Target Date 07/03/20      PT SHORT TERM GOAL #3   Title Patient will demonstrate hip extension strength of 3+/5 to improve lumbopelvic strength to facilitate improved body mechanics    Baseline 2+/5 to 3-/5 hip extension    Time 3    Period Weeks    Status New    Target Date 07/03/20             PT Long Term Goals - 06/12/20 1207      PT LONG TERM GOAL #1   Title Patient will report symptoms not extending past right buttock to manifest improved healing and decreased pain    Baseline L4-L5 dermatome outlined, 6/10 pain    Time 6    Period Weeks    Status New    Target Date 07/24/20      PT LONG TERM GOAL #2   Title Patient will improve on FOTO score to meet predicted outcomes to improve functional independence    Baseline 55% function    Time 6    Period Weeks    Status New    Target Date 07/24/20      PT LONG TERM GOAL #3   Title Patient will demo 3+/5 trunk strength to improve proximal stability    Baseline 2+/5, poor contraction palpated in lumbar erectors and multifidi    Time 6    Period Weeks    Status New    Target Date 07/24/20                  Plan - 06/12/20 1202    Clinical Impression Statement Patient is a  45 yo lady presenting to physical therapy with LBP and BLE radiating pain. She presents with pain limited deficits in spine and hip strength, ROM, endurance, postural impairments, spinal mobility and functional mobility with ADL. She is having to modify and restrict ADL as indicated by FOTO score as well as subjective information and objective measures which is affecting overall participation. Patient will benefit from skilled physical therapy in order to improve function and reduce impairment.    Personal Factors and Comorbidities Time since onset of injury/illness/exacerbation;Profession;Fitness    Examination-Activity Limitations Bend;Caring for Others;Carry;Lift;Stand;Locomotion Level     Examination-Participation Restrictions Cleaning;Community Activity;Laundry;Yard Work;Occupation    Stability/Clinical Decision Making Stable/Uncomplicated    Clinical Decision Making Low    Rehab Potential Good    PT Frequency 2x / week    PT Duration 6 weeks    PT Treatment/Interventions ADLs/Self Care Home Management;Aquatic Therapy;Cryotherapy;DME Instruction;Traction;Gait training;Stair training;Functional mobility training;Therapeutic activities;Therapeutic exercise;Balance training;Patient/family education;Neuromuscular re-education;Manual techniques;Passive range of motion;Taping;Energy conservation;Dry needling;Spinal Manipulations;Joint Manipulations    PT Next Visit Plan assess if extension vs flexion bias alleviates, continue with neutral position core strength    PT Home Exercise Plan prone with support, ab set    Consulted and Agree with Plan of Care Patient           Patient will benefit from skilled therapeutic intervention in order to improve the following deficits and impairments:  Decreased activity tolerance,Decreased endurance,Decreased range of motion,Decreased strength,Hypomobility,Difficulty walking,Impaired perceived functional ability,Postural dysfunction,Improper body mechanics,Pain  Visit Diagnosis: Radiculopathy, lumbar region  Chronic bilateral low back pain with right-sided sciatica  Muscle weakness (generalized)     Problem  List Patient Active Problem List   Diagnosis Date Noted  . Encounter for screening fecal occult blood testing 01/02/2020  . Encounter for well woman exam with routine gynecological exam 01/02/2020  . Migraine without aura and without status migrainosus, not intractable 10/27/2017  . Encounter for menstrual regulation 10/27/2017  . Encounter for gynecological examination with Papanicolaou smear of cervix 10/27/2017  . Screening for colorectal cancer 10/27/2017  . Musculoskeletal pain 08/21/2015  . Superficial thrombophlebitis in  puerperium 08/06/2015  . Status post repeat low transverse cesarean section 07/30/2015  . Consultation for female sterilization 07/17/2015  . Left ovarian cyst 03/12/2015  . Rubella non-immune status, antepartum 11/08/2013  . Other and unspecified ovarian cyst 03/30/2013  . Internal hernia 03/15/2013  . Abdominal pain 03/14/2013  . Hx of migraines 10/26/2012   12:12 PM, 06/12/20 M. Sherlyn Lees, PT, DPT Physical Therapist- [place] Office Number: [phone]  [facility] [address] [place], [place], [address] Phone: [phone]   Fax:  [phone]  Name: Kristen Dunn MRN: [id number] Date of Birth: [birth year]

## 2020-06-12 NOTE — Patient Instructions (Signed)
Access Code: [email or web address] URL: [email or web address] Date: 06/12/2020 Prepared by: Sherlyn Lees  Exercises Lying Prone - 1 x daily - 7 x weekly Abdominal Bracing - 2 x daily - 7 x weekly - 3 sets - 10 reps - 3 sec hold

## 2020-06-14 ENCOUNTER — Encounter (HOSPITAL_COMMUNITY): Payer: Self-pay

## 2020-06-14 ENCOUNTER — Ambulatory Visit (HOSPITAL_COMMUNITY): Payer: BC Managed Care – PPO

## 2020-06-14 ENCOUNTER — Other Ambulatory Visit: Payer: Self-pay

## 2020-06-14 DIAGNOSIS — M5416 Radiculopathy, lumbar region: Secondary | ICD-10-CM | POA: Diagnosis not present

## 2020-06-14 DIAGNOSIS — M6281 Muscle weakness (generalized): Secondary | ICD-10-CM | POA: Diagnosis not present

## 2020-06-14 DIAGNOSIS — G8929 Other chronic pain: Secondary | ICD-10-CM

## 2020-06-14 DIAGNOSIS — M5441 Lumbago with sciatica, right side: Secondary | ICD-10-CM | POA: Diagnosis not present

## 2020-06-14 NOTE — Therapy (Signed)
[place], [place], [address] Phone: [phone]   Fax:  [phone]  Physical Therapy Treatment  Patient Details  Name: Kristen Dunn MRN: [id number] Date of Birth: Jan 27, 1976 Referring Provider (PT): Doreatha Martin, MD   Encounter Date: 06/14/2020   PT End of Session - 06/14/20 1204    Visit Number 2    Number of Visits 12    Date for PT Re-Evaluation 07/24/20    Authorization Type [employer] PPO, no auth, 30 VL    Authorization - Visit Number 2    Authorization - Number of Visits 30    Progress Note Due on Visit 10    PT Start Time 1102    PT Stop Time 1154    PT Time Calculation (min) 52 min    Activity Tolerance Patient tolerated treatment well           Past Medical History:  Diagnosis Date  . AMA (advanced maternal age) multigravida 43+   . Anemia   . Asthma   . Degenerative lumbar disc   . Family history of adverse reaction to anesthesia    mother has nausea  . Gestational diabetes    glyburide  . Hx of migraines 10/26/2012  . Other and unspecified ovarian cyst 03/30/2013  . PONV (postoperative nausea and vomiting)     Past Surgical History:  Procedure Laterality Date  . CESAREAN SECTION  01/13/2012   Procedure: CESAREAN SECTION;  Surgeon: Mora Bellman, MD;  Location: [facility];  Service: Obstetrics;  Laterality: N/A;  . CESAREAN SECTION WITH BILATERAL TUBAL LIGATION Bilateral 07/30/2015   Procedure: REPEAT CESAREAN SECTION WITH BILATERAL TUBAL LIGATION, WIDE EXCISION OF CICHEIX;  Surgeon: Jonnie Kind, MD;  Location: [facility];  Service: Obstetrics;  Laterality: Bilateral;  . Gastric Bypass 2008    . INCISIONAL HERNIA REPAIR  03/31/2012   Procedure: LAPAROSCOPIC INCISIONAL HERNIA;  Surgeon: Edward Jolly, MD;  Location: [facility];  Service: General;  Laterality: N/A;  LAPAROSCOPY REPAIR INTERNAL HERNIA  petersons defect  . LAPAROSCOPIC GASTRIC BANDING WITH HIATAL HERNIA REPAIR N/A  03/14/2013   Procedure: DIAGNOSTIC LAPAROSCOPY  WITH  INTERNAL HERNIA  REDUCTION WITH CLOSURE;  Surgeon: Shann Medal, MD;  Location: [facility];  Service: General;  Laterality: N/A;  . TONSILLECTOMY      There were no vitals filed for this visit.   Subjective Assessment - 06/14/20 1102    Subjective Patient reports she hurt a lot after last session and last night but is doing okay today.    Currently in Pain? Yes    Pain Score 6     Pain Location Back    Pain Orientation Posterior;Lower    Pain Descriptors / Indicators Aching    Pain Type Chronic pain    Pain Radiating Towards shooting pain to right hip    Pain Onset More than a month ago    Pain Onset More than a month ago            [facility] Adult PT Treatment/Exercise - 06/14/20 0001      Lumbar Exercises: Stretches   Single Knee to Chest Stretch Right;Left;1 rep;30 seconds    Prone on Elbows Stretch Limitations    Prone on Elbows Stretch Limitations McKenzie method 2 minutes 2 sets    Press Ups 10 reps    Other Lumbar Stretch Exercise prone lying McKenzie method 2 minutes 2 sets      Lumbar Exercises: Supine  Ab Set 10 reps;5 seconds    Pelvic Tilt 10 reps;5 seconds             PT Education - 06/14/20 1204    Education Details Reviewed evaluation, goals and initial HEP. Advanced HEP. Log roll, seated and sleeping positions utilzing pillows, spinal anatomy and importance of abdominal activiation.    Person(s) Educated Patient    Methods Explanation;Handout    Comprehension Verbalized understanding            PT Short Term Goals - 06/12/20 1205      PT SHORT TERM GOAL #1   Title Patient will report at least 25% improvement in symptoms for improved quality of life with centralization of RLE symptoms    Time 3    Period Weeks    Status New    Target Date 07/03/20      PT SHORT TERM GOAL #2   Title Patient will be independent with HEP in order to improve functional outcomes.    Time 3    Period Weeks     Status New    Target Date 07/03/20      PT SHORT TERM GOAL #3   Title Patient will demonstrate hip extension strength of 3+/5 to improve lumbopelvic strength to facilitate improved body mechanics    Baseline 2+/5 to 3-/5 hip extension    Time 3    Period Weeks    Status New    Target Date 07/03/20             PT Long Term Goals - 06/12/20 1207      PT LONG TERM GOAL #1   Title Patient will report symptoms not extending past right buttock to manifest improved healing and decreased pain    Baseline L4-L5 dermatome outlined, 6/10 pain    Time 6    Period Weeks    Status New    Target Date 07/24/20      PT LONG TERM GOAL #2   Title Patient will improve on FOTO score to meet predicted outcomes to improve functional independence    Baseline 55% function    Time 6    Period Weeks    Status New    Target Date 07/24/20      PT LONG TERM GOAL #3   Title Patient will demo 3+/5 trunk strength to improve proximal stability    Baseline 2+/5, poor contraction palpated in lumbar erectors and multifidi    Time 6    Period Weeks    Status New    Target Date 07/24/20             Plan - 06/14/20 1205    Clinical Impression Statement Reviewed evaluation, goals and initial HEP. Session focused on education of body mechanics and positioning to decreased load on spine in seated and sleeping positions. Patient did comment her pain was worst at night. Recommended using pillows under/between knees to improve comfort when sleeping. Advanced therapeutic exercises and HEP to include prone lying, prone on elbows, prone press ups, abdominal isometric and pelvic tilt. At end of session, patient reported right radicular symptoms increased down to knee. It was at hip at beginning of session. Cues for patient to activate abdominal isometric to stabilize spine prior to any body part movement.    Personal Factors and Comorbidities Time since onset of injury/illness/exacerbation;Profession;Fitness     Examination-Activity Limitations Bend;Caring for Others;Carry;Lift;Stand;Locomotion Level    Examination-Participation Restrictions Cleaning;Community Activity;Laundry;Yard Work;Occupation    Stability/Clinical Decision  Making Stable/Uncomplicated    Rehab Potential Good    PT Frequency 2x / week    PT Duration 6 weeks    PT Treatment/Interventions ADLs/Self Care Home Management;Aquatic Therapy;Cryotherapy;DME Instruction;Traction;Gait training;Stair training;Functional mobility training;Therapeutic activities;Therapeutic exercise;Balance training;Patient/family education;Neuromuscular re-education;Manual techniques;Passive range of motion;Taping;Energy conservation;Dry needling;Spinal Manipulations;Joint Manipulations    PT Next Visit Plan assess if extension vs flexion bias alleviates, continue with neutral position core strength    PT Home Exercise Plan prone with support, ab set; 06/14/20 - prone lying, prone on elbows, prone press up, isometric abdominal set, pelvic tilt, log roll, body mechanics seated, sleeping    Consulted and Agree with Plan of Care Patient           Patient will benefit from skilled therapeutic intervention in order to improve the following deficits and impairments:  Decreased activity tolerance,Decreased endurance,Decreased range of motion,Decreased strength,Hypomobility,Difficulty walking,Impaired perceived functional ability,Postural dysfunction,Improper body mechanics,Pain  Visit Diagnosis: Radiculopathy, lumbar region  Chronic bilateral low back pain with right-sided sciatica  Muscle weakness (generalized)     Problem List Patient Active Problem List   Diagnosis Date Noted  . Encounter for screening fecal occult blood testing 01/02/2020  . Encounter for well woman exam with routine gynecological exam 01/02/2020  . Migraine without aura and without status migrainosus, not intractable 10/27/2017  . Encounter for menstrual regulation 10/27/2017  .  Encounter for gynecological examination with Papanicolaou smear of cervix 10/27/2017  . Screening for colorectal cancer 10/27/2017  . Musculoskeletal pain 08/21/2015  . Superficial thrombophlebitis in puerperium 08/06/2015  . Status post repeat low transverse cesarean section 07/30/2015  . Consultation for female sterilization 07/17/2015  . Left ovarian cyst 03/12/2015  . Rubella non-immune status, antepartum 11/08/2013  . Other and unspecified ovarian cyst 03/30/2013  . Internal hernia 03/15/2013  . Abdominal pain 03/14/2013  . Hx of migraines 10/26/2012    Floria Raveling. Hartnett-Rands, MS, PT [facility] #[id number] 06/14/2020, 12:13 PM  [facility] [address] [place], [place], [address] Phone: [phone]   Fax:  [phone]  Name: Kristen Dunn MRN: [id number] Date of Birth: September 23, 1975

## 2020-06-14 NOTE — Patient Instructions (Addendum)
Robin McKenzie's "Seven Steps to a Pain Free Life."  Abdominal Lying    Lie on abdomen, pillows as needed under hips, ankles, forehead and chest. Rest head down with arms to side of hips. Lie 2-3_ minutes.  Copyright  VHI. All rights reserved.   On Elbows (Prone)    Rise up on elbows with back and buttock muscles relaxed forming a hammock out of your back. Hold _2-3 seconds.   http://orth.exer.us/93   Copyright  VHI. All rights reserved.   Therapeutic - Prone Press-Up    Push up so chest is off floor and back is arched; buttock and back muscles relaxed. Do not raise hips. Pause. Return to prone position. Repeat _8-10_ times.  Copyright  VHI. All rights reserved.   Isometric Abdominal    Lying on back with knees bent, tighten stomach/Kegels. Hold _5_ seconds. Repeat _10_ times per set. Do _1_ sets per session. Do _1_ sessions per day.  http://orth.exer.us/1087   Copyright  VHI. All rights reserved.   Pelvic Tilt: Posterior - Legs Bent (Supine)    Tighten stomach and flatten back by rolling pelvis down. Hold _5___ seconds. Relax. Repeat _10_ times per set. Do _1 sets per session. Do _1___ sessions per day.  http://orth.exer.us/203   Copyright  VHI. All rights reserved.

## 2020-06-19 ENCOUNTER — Encounter (HOSPITAL_COMMUNITY): Payer: Self-pay

## 2020-06-19 ENCOUNTER — Other Ambulatory Visit: Payer: Self-pay

## 2020-06-19 ENCOUNTER — Ambulatory Visit (HOSPITAL_COMMUNITY): Payer: BC Managed Care – PPO | Attending: Physical Medicine & Rehabilitation

## 2020-06-19 DIAGNOSIS — M6281 Muscle weakness (generalized): Secondary | ICD-10-CM | POA: Insufficient documentation

## 2020-06-19 DIAGNOSIS — G8929 Other chronic pain: Secondary | ICD-10-CM | POA: Insufficient documentation

## 2020-06-19 DIAGNOSIS — M5441 Lumbago with sciatica, right side: Secondary | ICD-10-CM | POA: Insufficient documentation

## 2020-06-19 DIAGNOSIS — M5416 Radiculopathy, lumbar region: Secondary | ICD-10-CM | POA: Insufficient documentation

## 2020-06-19 NOTE — Therapy (Signed)
[facility] [place], [place], [address] Phone: [phone]   Fax:  [phone]  Physical Therapy Treatment  Patient Details  Name: Kristen Dunn MRN: [id number] Date of Birth: 1975-09-08 Referring Provider (PT): Doreatha Martin, MD   Encounter Date: 06/19/2020   PT End of Session - 06/19/20 1156    Visit Number 3    Number of Visits 12    Date for PT Re-Evaluation 07/24/20    Authorization Type [employer] Comm PPO, no auth, 30 VL    Authorization - Visit Number 3    Authorization - Number of Visits 30    Progress Note Due on Visit 10    PT Start Time 1134    PT Stop Time 1212    PT Time Calculation (min) 38 min    Activity Tolerance Patient tolerated treatment well;No increased pain    Behavior During Therapy WFL for tasks assessed/performed           Past Medical History:  Diagnosis Date  . AMA (advanced maternal age) multigravida 37+   . Anemia   . Asthma   . Degenerative lumbar disc   . Family history of adverse reaction to anesthesia    mother has nausea  . Gestational diabetes    glyburide  . Hx of migraines 10/26/2012  . Other and unspecified ovarian cyst 03/30/2013  . PONV (postoperative nausea and vomiting)     Past Surgical History:  Procedure Laterality Date  . CESAREAN SECTION  01/13/2012   Procedure: CESAREAN SECTION;  Surgeon: Mora Bellman, MD;  Location: [facility];  Service: Obstetrics;  Laterality: N/A;  . CESAREAN SECTION WITH BILATERAL TUBAL LIGATION Bilateral 07/30/2015   Procedure: REPEAT CESAREAN SECTION WITH BILATERAL TUBAL LIGATION, WIDE EXCISION OF CICHEIX;  Surgeon: Jonnie Kind, MD;  Location: [facility];  Service: Obstetrics;  Laterality: Bilateral;  . Gastric Bypass 2008    . INCISIONAL HERNIA REPAIR  03/31/2012   Procedure: LAPAROSCOPIC INCISIONAL HERNIA;  Surgeon: Edward Jolly, MD;  Location: [facility];  Service: General;  Laterality: N/A;  LAPAROSCOPY REPAIR INTERNAL HERNIA   petersons defect  . LAPAROSCOPIC GASTRIC BANDING WITH HIATAL HERNIA REPAIR N/A 03/14/2013   Procedure: DIAGNOSTIC LAPAROSCOPY  WITH  INTERNAL HERNIA  REDUCTION WITH CLOSURE;  Surgeon: Shann Medal, MD;  Location: [facility];  Service: General;  Laterality: N/A;  . TONSILLECTOMY      There were no vitals filed for this visit.   Subjective Assessment - 06/19/20 1137    Subjective Pt stated she has began HEP daily and reports she is feeling good today.  Does have normal pain maybe 4-5/10 LBP, no reports of radicular symptoms currently.  Does reports increased radicular symptoms at night.    Currently in Pain? Yes    Pain Score 5     Pain Location Back    Pain Orientation Lower;Posterior    Pain Descriptors / Indicators Aching    Pain Type Chronic pain    Pain Radiating Towards shooting pain to right hip    Pain Onset More than a month ago    Pain Frequency Intermittent    Aggravating Factors  sitting, standing for long periods of time    Pain Relieving Factors prone exercises, yoga stretches    Effect of Pain on Daily Activities limits  [facility] Adult PT Treatment/Exercise - 06/19/20 0001      Exercises   Exercises Lumbar      Lumbar Exercises: Stretches   Standing Extension 5 reps;10 seconds   core activation prior extend   Prone on Elbows Stretch 2 reps    Prone on Elbows Stretch Limitations 2 setsx 2 min    Press Ups 5 reps;10 seconds    Other Lumbar Stretch Exercise prone lying McKenzie method 2 minutes      Lumbar Exercises: Supine   Ab Set 10 reps;5 seconds    AB Set Limitations paired wiht exhale    Pelvic Tilt 10 reps;5 seconds    Bridge 10 reps      Lumbar Exercises: Sidelying   Clam 10 reps    Clam Limitations core activation prior raise to keep spine in neutral      Lumbar Exercises: Prone   Other Prone Lumbar Exercises Prone heel squeeze 5x 5" holds    Other Prone Lumbar Exercises abdominal sets with exhalation 10x 5"                     PT Short Term Goals - 06/12/20 1205      PT SHORT TERM GOAL #1   Title Patient will report at least 25% improvement in symptoms for improved quality of life with centralization of RLE symptoms    Time 3    Period Weeks    Status New    Target Date 07/03/20      PT SHORT TERM GOAL #2   Title Patient will be independent with HEP in order to improve functional outcomes.    Time 3    Period Weeks    Status New    Target Date 07/03/20      PT SHORT TERM GOAL #3   Title Patient will demonstrate hip extension strength of 3+/5 to improve lumbopelvic strength to facilitate improved body mechanics    Baseline 2+/5 to 3-/5 hip extension    Time 3    Period Weeks    Status New    Target Date 07/03/20             PT Long Term Goals - 06/12/20 1207      PT LONG TERM GOAL #1   Title Patient will report symptoms not extending past right buttock to manifest improved healing and decreased pain    Baseline L4-L5 dermatome outlined, 6/10 pain    Time 6    Period Weeks    Status New    Target Date 07/24/20      PT LONG TERM GOAL #2   Title Patient will improve on FOTO score to meet predicted outcomes to improve functional independence    Baseline 55% function    Time 6    Period Weeks    Status New    Target Date 07/24/20      PT LONG TERM GOAL #3   Title Patient will demo 3+/5 trunk strength to improve proximal stability    Baseline 2+/5, poor contraction palpated in lumbar erectors and multifidi    Time 6    Period Weeks    Status New    Target Date 07/24/20                 Plan - 06/19/20 1156    Clinical Impression Statement Continued extension based exercises following positive reports last sessoin.  Added isometric abdominal and gluteal strengthening exercises.  Progressed to clams, bridges  and prone heel squeezes for gluteal strengthening, cueing for abdominal activation prior LE movement to assist with spine in neutral position with  movement.  Pt tolerated well with no reports of increased pain, did c/o spasm following prone press-ups.    Personal Factors and Comorbidities Time since onset of injury/illness/exacerbation;Profession;Fitness    Examination-Activity Limitations Bend;Caring for Others;Carry;Lift;Stand;Locomotion Level    Examination-Participation Restrictions Cleaning;Community Activity;Laundry;Yard Work;Occupation    Stability/Clinical Decision Making Stable/Uncomplicated    Clinical Decision Making Low    Rehab Potential Good    PT Frequency 2x / week    PT Duration 6 weeks    PT Treatment/Interventions ADLs/Self Care Home Management;Aquatic Therapy;Cryotherapy;DME Instruction;Traction;Gait training;Stair training;Functional mobility training;Therapeutic activities;Therapeutic exercise;Balance training;Patient/family education;Neuromuscular re-education;Manual techniques;Passive range of motion;Taping;Energy conservation;Dry needling;Spinal Manipulations;Joint Manipulations    PT Next Visit Plan assess if extension vs flexion bias alleviates, continue with neutral position core strength    PT Home Exercise Plan prone with support, ab set; 06/14/20 - prone lying, prone on elbows, prone press up, isometric abdominal set, pelvic tilt, log roll, body mechanics seated, sleeping; 06/19/20: bridge           Patient will benefit from skilled therapeutic intervention in order to improve the following deficits and impairments:  Decreased activity tolerance,Decreased endurance,Decreased range of motion,Decreased strength,Hypomobility,Difficulty walking,Impaired perceived functional ability,Postural dysfunction,Improper body mechanics,Pain  Visit Diagnosis: Radiculopathy, lumbar region  Chronic bilateral low back pain with right-sided sciatica  Muscle weakness (generalized)     Problem List Patient Active Problem List   Diagnosis Date Noted  . Encounter for screening fecal occult blood testing 01/02/2020  .  Encounter for well woman exam with routine gynecological exam 01/02/2020  . Migraine without aura and without status migrainosus, not intractable 10/27/2017  . Encounter for menstrual regulation 10/27/2017  . Encounter for gynecological examination with Papanicolaou smear of cervix 10/27/2017  . Screening for colorectal cancer 10/27/2017  . Musculoskeletal pain 08/21/2015  . Superficial thrombophlebitis in puerperium 08/06/2015  . Status post repeat low transverse cesarean section 07/30/2015  . Consultation for female sterilization 07/17/2015  . Left ovarian cyst 03/12/2015  . Rubella non-immune status, antepartum 11/08/2013  . Other and unspecified ovarian cyst 03/30/2013  . Internal hernia 03/15/2013  . Abdominal pain 03/14/2013  . Hx of migraines 10/26/2012   Ihor Austin, LPTA/CLT; CBIS [phone]  Aldona Lento 06/19/2020, 12:14 PM  [place], [place], [address] Phone: [phone]   Fax:  [phone]  Name: Kristen Dunn MRN: [id number] Date of Birth: 1975-07-05

## 2020-06-19 NOTE — Patient Instructions (Signed)
Bridge    Lie back, legs bent. Inhale, pressing hips up. Keeping ribs in, lengthen lower back. Exhale, rolling down along spine from top. Make sure to activate core prior movement. Repeat 10 times. Do 2 sessions per day.  http://pm.exer.us/55   Copyright  VHI. All rights reserved.

## 2020-06-21 ENCOUNTER — Other Ambulatory Visit: Payer: Self-pay

## 2020-06-21 ENCOUNTER — Ambulatory Visit (HOSPITAL_COMMUNITY): Payer: BC Managed Care – PPO

## 2020-06-21 ENCOUNTER — Encounter (HOSPITAL_COMMUNITY): Payer: Self-pay

## 2020-06-21 DIAGNOSIS — M5416 Radiculopathy, lumbar region: Secondary | ICD-10-CM | POA: Diagnosis not present

## 2020-06-21 DIAGNOSIS — M5441 Lumbago with sciatica, right side: Secondary | ICD-10-CM | POA: Diagnosis not present

## 2020-06-21 DIAGNOSIS — G8929 Other chronic pain: Secondary | ICD-10-CM

## 2020-06-21 DIAGNOSIS — M6281 Muscle weakness (generalized): Secondary | ICD-10-CM | POA: Diagnosis not present

## 2020-06-21 NOTE — Therapy (Signed)
[place], [place], [address] Phone: [phone]   Fax:  [phone]  Physical Therapy Treatment  Patient Details  Name: Kristen Dunn MRN: [id number] Date of Birth: 1976/03/29 Referring Provider (PT): Doreatha Martin, MD   Encounter Date: 06/21/2020   PT End of Session - 06/21/20 1049    Visit Number 4    Number of Visits 12    Date for PT Re-Evaluation 07/24/20    Authorization Type [employer] Comm PPO, no auth, 30 VL    Authorization - Visit Number 4    Authorization - Number of Visits 30    Progress Note Due on Visit 10    PT Start Time 1050    PT Stop Time 8185    PT Time Calculation (min) 38 min    Activity Tolerance Patient tolerated treatment well;No increased pain;Patient limited by pain    Behavior During Therapy [facility] for tasks assessed/performed           Past Medical History:  Diagnosis Date  . AMA (advanced maternal age) multigravida 55+   . Anemia   . Asthma   . Degenerative lumbar disc   . Family history of adverse reaction to anesthesia    mother has nausea  . Gestational diabetes    glyburide  . Hx of migraines 10/26/2012  . Other and unspecified ovarian cyst 03/30/2013  . PONV (postoperative nausea and vomiting)     Past Surgical History:  Procedure Laterality Date  . CESAREAN SECTION  01/13/2012   Procedure: CESAREAN SECTION;  Surgeon: Mora Bellman, MD;  Location: [facility];  Service: Obstetrics;  Laterality: N/A;  . CESAREAN SECTION WITH BILATERAL TUBAL LIGATION Bilateral 07/30/2015   Procedure: REPEAT CESAREAN SECTION WITH BILATERAL TUBAL LIGATION, WIDE EXCISION OF CICHEIX;  Surgeon: Jonnie Kind, MD;  Location: [facility];  Service: Obstetrics;  Laterality: Bilateral;  . Gastric Bypass 2008    . INCISIONAL HERNIA REPAIR  03/31/2012   Procedure: LAPAROSCOPIC INCISIONAL HERNIA;  Surgeon: Edward Jolly, MD;  Location: [facility];  Service: General;  Laterality: N/A;  LAPAROSCOPY REPAIR  INTERNAL HERNIA  petersons defect  . LAPAROSCOPIC GASTRIC BANDING WITH HIATAL HERNIA REPAIR N/A 03/14/2013   Procedure: DIAGNOSTIC LAPAROSCOPY  WITH  INTERNAL HERNIA  REDUCTION WITH CLOSURE;  Surgeon: Shann Medal, MD;  Location: [facility];  Service: General;  Laterality: N/A;  . TONSILLECTOMY      There were no vitals filed for this visit.   Subjective Assessment - 06/21/20 1055    Subjective Pt reports she had rough night due to LBP last night.  Current pain scale 6/10 more Rt > Lt.    Currently in Pain? Yes    Pain Score 6     Pain Location Back    Pain Orientation Lower;Right    Pain Descriptors / Indicators Aching;Dull    Pain Type Chronic pain    Pain Radiating Towards shooting pain to Rt hip    Pain Onset More than a month ago    Pain Frequency Intermittent    Aggravating Factors  sitting, standing for long periods of time    Pain Relieving Factors prone exercises, yoga stretches    Effect of Pain on Daily Activities limits                             [facility] Adult PT Treatment/Exercise - 06/21/20 0001      Exercises  Exercises Lumbar      Lumbar Exercises: Stretches   Standing Extension 10 reps;10 seconds    Prone on Elbows Stretch 2 reps    Press Ups 5 reps;10 seconds      Lumbar Exercises: Seated   Other Seated Lumbar Exercises Sitting posture with abdominal sets      Lumbar Exercises: Supine   Ab Set 10 reps;5 seconds    AB Set Limitations paired wiht exhale    Bridge 10 reps;3 seconds    Bridge Limitations 2 sets    Other Supine Lumbar Exercises Decompression 2-5 5reps x 3"      Lumbar Exercises: Sidelying   Clam 10 reps;3 seconds    Clam Limitations core activation RTB around thigh      Lumbar Exercises: Prone   Straight Leg Raise 10 reps;3 seconds    Other Prone Lumbar Exercises Prone heel squeeze 5x 5" holds    Other Prone Lumbar Exercises abdominal sets with exhalation 10x 5"                    PT Short Term Goals -  06/12/20 1205      PT SHORT TERM GOAL #1   Title Patient will report at least 25% improvement in symptoms for improved quality of life with centralization of RLE symptoms    Time 3    Period Weeks    Status New    Target Date 07/03/20      PT SHORT TERM GOAL #2   Title Patient will be independent with HEP in order to improve functional outcomes.    Time 3    Period Weeks    Status New    Target Date 07/03/20      PT SHORT TERM GOAL #3   Title Patient will demonstrate hip extension strength of 3+/5 to improve lumbopelvic strength to facilitate improved body mechanics    Baseline 2+/5 to 3-/5 hip extension    Time 3    Period Weeks    Status New    Target Date 07/03/20             PT Long Term Goals - 06/12/20 1207      PT LONG TERM GOAL #1   Title Patient will report symptoms not extending past right buttock to manifest improved healing and decreased pain    Baseline L4-L5 dermatome outlined, 6/10 pain    Time 6    Period Weeks    Status New    Target Date 07/24/20      PT LONG TERM GOAL #2   Title Patient will improve on FOTO score to meet predicted outcomes to improve functional independence    Baseline 55% function    Time 6    Period Weeks    Status New    Target Date 07/24/20      PT LONG TERM GOAL #3   Title Patient will demo 3+/5 trunk strength to improve proximal stability    Baseline 2+/5, poor contraction palpated in lumbar erectors and multifidi    Time 6    Period Weeks    Status New    Target Date 07/24/20                 Plan - 06/21/20 1113    Clinical Impression Statement Contniued extension based exercises with positive reports.  Added decompression exercises for posterior chain strengthening and RTB resistance wiht clams for gluteal strengthening.  Pt tolerated well to session with  no reorts of increased pain.  1 episode of radicular symptoms to knee that was quickly resolved wiht change in position.  Visible gluteal fatigue with  additional prone hip extension.    Personal Factors and Comorbidities Time since onset of injury/illness/exacerbation;Profession;Fitness    Examination-Activity Limitations Bend;Caring for Others;Carry;Lift;Stand;Locomotion Level    Examination-Participation Restrictions Cleaning;Community Activity;Laundry;Yard Work;Occupation    Stability/Clinical Decision Making Stable/Uncomplicated    Clinical Decision Making Low    Rehab Potential Good    PT Frequency 2x / week    PT Duration 6 weeks    PT Treatment/Interventions ADLs/Self Care Home Management;Aquatic Therapy;Cryotherapy;DME Instruction;Traction;Gait training;Stair training;Functional mobility training;Therapeutic activities;Therapeutic exercise;Balance training;Patient/family education;Neuromuscular re-education;Manual techniques;Passive range of motion;Taping;Energy conservation;Dry needling;Spinal Manipulations;Joint Manipulations    PT Next Visit Plan Continue wiht neutral position core strengthening, extension bias alleviates radicular symptoms.  Next session add 3D hip excursion (squat/lumbar extend, lateral weight shifting, rotate) and decompression exercises wiht RTB for postural strengthening (if able to complete correctly given pt RTB, already in box).    PT Home Exercise Plan prone with support, ab set; 06/14/20 - prone lying, prone on elbows, prone press up, isometric abdominal set, pelvic tilt, log roll, body mechanics seated, sleeping; 06/19/20: bridge; 3/3: decompression    Consulted and Agree with Plan of Care Patient           Patient will benefit from skilled therapeutic intervention in order to improve the following deficits and impairments:  Decreased activity tolerance,Decreased endurance,Decreased range of motion,Decreased strength,Hypomobility,Difficulty walking,Impaired perceived functional ability,Postural dysfunction,Improper body mechanics,Pain  Visit Diagnosis: Chronic bilateral low back pain with right-sided  sciatica  Muscle weakness (generalized)  Radiculopathy, lumbar region     Problem List Patient Active Problem List   Diagnosis Date Noted  . Encounter for screening fecal occult blood testing 01/02/2020  . Encounter for well woman exam with routine gynecological exam 01/02/2020  . Migraine without aura and without status migrainosus, not intractable 10/27/2017  . Encounter for menstrual regulation 10/27/2017  . Encounter for gynecological examination with Papanicolaou smear of cervix 10/27/2017  . Screening for colorectal cancer 10/27/2017  . Musculoskeletal pain 08/21/2015  . Superficial thrombophlebitis in puerperium 08/06/2015  . Status post repeat low transverse cesarean section 07/30/2015  . Consultation for female sterilization 07/17/2015  . Left ovarian cyst 03/12/2015  . Rubella non-immune status, antepartum 11/08/2013  . Other and unspecified ovarian cyst 03/30/2013  . Internal hernia 03/15/2013  . Abdominal pain 03/14/2013  . Hx of migraines 10/26/2012   Ihor Austin, LPTA/CLT; CBIS [phone]  Aldona Lento 06/21/2020, 11:34 AM  [facility] [address] [place], [place], [address] Phone: [phone]   Fax:  [phone]  Name: Kristen Dunn MRN: [id number] Date of Birth: 07-02-1975

## 2020-06-25 ENCOUNTER — Ambulatory Visit (HOSPITAL_COMMUNITY): Payer: BC Managed Care – PPO

## 2020-06-28 ENCOUNTER — Ambulatory Visit (HOSPITAL_COMMUNITY): Payer: BC Managed Care – PPO | Admitting: Physical Therapy

## 2020-07-02 ENCOUNTER — Ambulatory Visit (HOSPITAL_COMMUNITY): Payer: BC Managed Care – PPO | Admitting: Physical Therapy

## 2020-07-02 ENCOUNTER — Other Ambulatory Visit: Payer: Self-pay

## 2020-07-02 DIAGNOSIS — G8929 Other chronic pain: Secondary | ICD-10-CM

## 2020-07-02 DIAGNOSIS — M5416 Radiculopathy, lumbar region: Secondary | ICD-10-CM | POA: Diagnosis not present

## 2020-07-02 DIAGNOSIS — M5441 Lumbago with sciatica, right side: Secondary | ICD-10-CM | POA: Diagnosis not present

## 2020-07-02 DIAGNOSIS — M6281 Muscle weakness (generalized): Secondary | ICD-10-CM | POA: Diagnosis not present

## 2020-07-02 NOTE — Therapy (Signed)
[place], [place], [address] Phone: [phone]   Fax:  [phone]  Physical Therapy Treatment  Patient Details  Name: Kristen Dunn MRN: [id number] Date of Birth: 1975-06-28 Referring Provider (PT): Doreatha Martin, MD   Encounter Date: 07/02/2020   PT End of Session - 07/02/20 1156    Visit Number 5    Number of Visits 12    Date for PT Re-Evaluation 07/24/20    Authorization Type [employer] Comm PPO, no auth, 30 VL    Authorization - Visit Number 5    Authorization - Number of Visits 30    Progress Note Due on Visit 10    PT Start Time 1055    PT Stop Time 1133    PT Time Calculation (min) 38 min    Activity Tolerance Patient tolerated treatment well;No increased pain;Patient limited by pain    Behavior During Therapy [facility] for tasks assessed/performed           Past Medical History:  Diagnosis Date  . AMA (advanced maternal age) multigravida 66+   . Anemia   . Asthma   . Degenerative lumbar disc   . Family history of adverse reaction to anesthesia    mother has nausea  . Gestational diabetes    glyburide  . Hx of migraines 10/26/2012  . Other and unspecified ovarian cyst 03/30/2013  . PONV (postoperative nausea and vomiting)     Past Surgical History:  Procedure Laterality Date  . CESAREAN SECTION  01/13/2012   Procedure: CESAREAN SECTION;  Surgeon: Mora Bellman, MD;  Location: [facility];  Service: Obstetrics;  Laterality: N/A;  . CESAREAN SECTION WITH BILATERAL TUBAL LIGATION Bilateral 07/30/2015   Procedure: REPEAT CESAREAN SECTION WITH BILATERAL TUBAL LIGATION, WIDE EXCISION OF CICHEIX;  Surgeon: Jonnie Kind, MD;  Location: [facility];  Service: Obstetrics;  Laterality: Bilateral;  . Gastric Bypass 2008    . INCISIONAL HERNIA REPAIR  03/31/2012   Procedure: LAPAROSCOPIC INCISIONAL HERNIA;  Surgeon: Edward Jolly, MD;  Location: [facility];  Service: General;  Laterality: N/A;  LAPAROSCOPY REPAIR  INTERNAL HERNIA  petersons defect  . LAPAROSCOPIC GASTRIC BANDING WITH HIATAL HERNIA REPAIR N/A 03/14/2013   Procedure: DIAGNOSTIC LAPAROSCOPY  WITH  INTERNAL HERNIA  REDUCTION WITH CLOSURE;  Surgeon: Shann Medal, MD;  Location: [facility];  Service: General;  Laterality: N/A;  . TONSILLECTOMY      There were no vitals filed for this visit.   Subjective Assessment - 07/02/20 1058    Subjective pt states she lifted a 50# bag of cat litter over the weekend and was alittle sore.  Central LB pain at 5/10 today.    Currently in Pain? Yes    Pain Score 5     Pain Location Back                             [facility] Adult PT Treatment/Exercise - 07/02/20 0001      Lumbar Exercises: Stretches   Prone on Elbows Stretch 2 reps    Press Ups 5 reps;10 seconds    Other Lumbar Stretch Exercise hip excursions 10X each way      Lumbar Exercises: Supine   Bridge 10 reps;3 seconds    Bridge Limitations 2 sets    Other Supine Lumbar Exercises Decompression 2-5 5reps x 3"    Other Supine Lumbar Exercises decompression RTB 5reps  Lumbar Exercises: Sidelying   Clam 10 reps;3 seconds    Clam Limitations core activation RTB around thigh      Lumbar Exercises: Prone   Straight Leg Raise 10 reps;3 seconds    Other Prone Lumbar Exercises Prone heel squeeze 5x 5" holds                    PT Short Term Goals - 06/12/20 1205      PT SHORT TERM GOAL #1   Title Patient will report at least 25% improvement in symptoms for improved quality of life with centralization of RLE symptoms    Time 3    Period Weeks    Status New    Target Date 07/03/20      PT SHORT TERM GOAL #2   Title Patient will be independent with HEP in order to improve functional outcomes.    Time 3    Period Weeks    Status New    Target Date 07/03/20      PT SHORT TERM GOAL #3   Title Patient will demonstrate hip extension strength of 3+/5 to improve lumbopelvic strength to facilitate improved body  mechanics    Baseline 2+/5 to 3-/5 hip extension    Time 3    Period Weeks    Status New    Target Date 07/03/20             PT Long Term Goals - 06/12/20 1207      PT LONG TERM GOAL #1   Title Patient will report symptoms not extending past right buttock to manifest improved healing and decreased pain    Baseline L4-L5 dermatome outlined, 6/10 pain    Time 6    Period Weeks    Status New    Target Date 07/24/20      PT LONG TERM GOAL #2   Title Patient will improve on FOTO score to meet predicted outcomes to improve functional independence    Baseline 55% function    Time 6    Period Weeks    Status New    Target Date 07/24/20      PT LONG TERM GOAL #3   Title Patient will demo 3+/5 trunk strength to improve proximal stability    Baseline 2+/5, poor contraction palpated in lumbar erectors and multifidi    Time 6    Period Weeks    Status New    Target Date 07/24/20                 Plan - 07/02/20 1157    Clinical Impression Statement Pt with a little soreness today from lifting a 50# bag but otherwise doing good and being compliant with HEP.  Began with adding hip excursions with cues for form and increasing movements with lateral flexion and rotation.  Pt without any pain completing these.  Also added decompression with theraband with good form noted.  Pt given written decompression instructions for home as well.  Finished with prone press ups and extensions.  Noted weakness in glutes.    Personal Factors and Comorbidities Time since onset of injury/illness/exacerbation;Profession;Fitness    Examination-Activity Limitations Bend;Caring for Others;Carry;Lift;Stand;Locomotion Level    Examination-Participation Restrictions Cleaning;Community Activity;Laundry;Yard Work;Occupation    Stability/Clinical Decision Making Stable/Uncomplicated    Rehab Potential Good    PT Frequency 2x / week    PT Duration 6 weeks    PT Treatment/Interventions ADLs/Self Care Home  Management;Aquatic Therapy;Cryotherapy;DME Instruction;Traction;Gait training;Stair training;Functional mobility training;Therapeutic  activities;Therapeutic exercise;Balance training;Patient/family education;Neuromuscular re-education;Manual techniques;Passive range of motion;Taping;Energy conservation;Dry needling;Spinal Manipulations;Joint Manipulations    PT Next Visit Plan Continue with neutral position core strengthening, extension bias alleviates radicular symptoms. Progress to standing functional strengthening.    PT Home Exercise Plan prone with support, ab set; 06/14/20 - prone lying, prone on elbows, prone press up, isometric abdominal set, pelvic tilt, log roll, body mechanics seated, sleeping; 06/19/20: bridge; 3/3: decompression 3/14: decompression with RTB    Consulted and Agree with Plan of Care Patient           Patient will benefit from skilled therapeutic intervention in order to improve the following deficits and impairments:  Decreased activity tolerance,Decreased endurance,Decreased range of motion,Decreased strength,Hypomobility,Difficulty walking,Impaired perceived functional ability,Postural dysfunction,Improper body mechanics,Pain  Visit Diagnosis: Chronic bilateral low back pain with right-sided sciatica  Muscle weakness (generalized)  Radiculopathy, lumbar region     Problem List Patient Active Problem List   Diagnosis Date Noted  . Encounter for screening fecal occult blood testing 01/02/2020  . Encounter for well woman exam with routine gynecological exam 01/02/2020  . Migraine without aura and without status migrainosus, not intractable 10/27/2017  . Encounter for menstrual regulation 10/27/2017  . Encounter for gynecological examination with Papanicolaou smear of cervix 10/27/2017  . Screening for colorectal cancer 10/27/2017  . Musculoskeletal pain 08/21/2015  . Superficial thrombophlebitis in puerperium 08/06/2015  . Status post repeat low transverse  cesarean section 07/30/2015  . Consultation for female sterilization 07/17/2015  . Left ovarian cyst 03/12/2015  . Rubella non-immune status, antepartum 11/08/2013  . Other and unspecified ovarian cyst 03/30/2013  . Internal hernia 03/15/2013  . Abdominal pain 03/14/2013  . Hx of migraines 10/26/2012   Teena Irani, PTA/CLT [phone]  Teena Irani 07/02/2020, 12:00 PM  [facility] [address] [place], [place], [address] Phone: [phone]   Fax:  [phone]  Name: Kristen Dunn MRN: [id number] Date of Birth: November 28, 1975

## 2020-07-05 ENCOUNTER — Other Ambulatory Visit: Payer: Self-pay

## 2020-07-05 ENCOUNTER — Ambulatory Visit (HOSPITAL_COMMUNITY): Payer: BC Managed Care – PPO | Admitting: Physical Therapy

## 2020-07-05 DIAGNOSIS — G8929 Other chronic pain: Secondary | ICD-10-CM

## 2020-07-05 DIAGNOSIS — M5441 Lumbago with sciatica, right side: Secondary | ICD-10-CM | POA: Diagnosis not present

## 2020-07-05 DIAGNOSIS — M6281 Muscle weakness (generalized): Secondary | ICD-10-CM | POA: Diagnosis not present

## 2020-07-05 DIAGNOSIS — M5416 Radiculopathy, lumbar region: Secondary | ICD-10-CM | POA: Diagnosis not present

## 2020-07-05 NOTE — Therapy (Addendum)
[place], [place], [address] Phone: [phone]   Fax:  [phone]  Physical Therapy Treatment  Patient Details  Name: Kristen Dunn MRN: [id number] Date of Birth: 06-Mar-1976 Referring Provider (PT): Doreatha Martin, MD   Encounter Date: 07/05/2020     PT End of Session -    Visit Number 6    Number of Visits 12    Date for PT Re-Evaluation 07/24/20    Authorization Type [employer] Comm PPO, no auth, 30 VL    Authorization - Visit Number 6    Authorization - Number of Visits 30    Progress Note Due on Visit 10    PT Start Time 5400    PT Stop Time 1130    PT Time Calculation (min) 42 min    Activity Tolerance Patient tolerated treatment well;No increased pain;Patient limited by pain    Behavior During Therapy [facility] for tasks assessed/performed           Past Medical History:  Diagnosis Date  . AMA (advanced maternal age) multigravida 42+   . Anemia   . Asthma   . Degenerative lumbar disc   . Family history of adverse reaction to anesthesia    mother has nausea  . Gestational diabetes    glyburide  . Hx of migraines 10/26/2012  . Other and unspecified ovarian cyst 03/30/2013  . PONV (postoperative nausea and vomiting)     Past Surgical History:  Procedure Laterality Date  . CESAREAN SECTION  01/13/2012   Procedure: CESAREAN SECTION;  Surgeon: Mora Bellman, MD;  Location: [facility];  Service: Obstetrics;  Laterality: N/A;  . CESAREAN SECTION WITH BILATERAL TUBAL LIGATION Bilateral 07/30/2015   Procedure: REPEAT CESAREAN SECTION WITH BILATERAL TUBAL LIGATION, WIDE EXCISION OF CICHEIX;  Surgeon: Jonnie Kind, MD;  Location: [facility];  Service: Obstetrics;  Laterality: Bilateral;  . Gastric Bypass 2008    . INCISIONAL HERNIA REPAIR  03/31/2012   Procedure: LAPAROSCOPIC INCISIONAL HERNIA;  Surgeon: Edward Jolly, MD;  Location: [facility];  Service: General;  Laterality: N/A;  LAPAROSCOPY REPAIR INTERNAL  HERNIA  petersons defect  . LAPAROSCOPIC GASTRIC BANDING WITH HIATAL HERNIA REPAIR N/A 03/14/2013   Procedure: DIAGNOSTIC LAPAROSCOPY  WITH  INTERNAL HERNIA  REDUCTION WITH CLOSURE;  Surgeon: Shann Medal, MD;  Location: [facility];  Service: General;  Laterality: N/A;  . TONSILLECTOMY      There were no vitals filed for this visit.   Subjective Assessment - 07/05/20 1053    Subjective pt states she 's a little sore today but not really hurting.  Reports compliance with HEP.  States she got a massage Tuesday night but then had to sit alot in her chair for work all day yesterday    Currently in Pain? No/denies                             [facility] Adult PT Treatment/Exercise - 07/05/20 0001      Lumbar Exercises: Stretches   Prone on Elbows Stretch 2 reps;60 seconds    Prone on Elbows Stretch Limitations 2 minutes    Press Ups 5 reps;10 seconds    Other Lumbar Stretch Exercise hip abduction and extension 10X each, vectors 10X5" with 1 HHA    Other Lumbar Stretch Exercise hip excursions 10X each way      Lumbar Exercises: Supine   Bridge 10 reps;3 seconds  Bridge Limitations 2 sets    Other Supine Lumbar Exercises Decompression 2-5 5reps x 3"    Other Supine Lumbar Exercises decompression RTB 5reps      Lumbar Exercises: Sidelying   Clam 10 reps;3 seconds    Clam Limitations core activation RTB around thigh; added RTB      Lumbar Exercises: Prone   Other Prone Lumbar Exercises Prone heel squeeze 5x 5" holds                    PT Short Term Goals - 06/12/20 1205      PT SHORT TERM GOAL #1   Title Patient will report at least 25% improvement in symptoms for improved quality of life with centralization of RLE symptoms    Time 3    Period Weeks    Status New    Target Date 07/03/20      PT SHORT TERM GOAL #2   Title Patient will be independent with HEP in order to improve functional outcomes.    Time 3    Period Weeks    Status New    Target Date  07/03/20      PT SHORT TERM GOAL #3   Title Patient will demonstrate hip extension strength of 3+/5 to improve lumbopelvic strength to facilitate improved body mechanics    Baseline 2+/5 to 3-/5 hip extension    Time 3    Period Weeks    Status New    Target Date 07/03/20             PT Long Term Goals - 06/12/20 1207      PT LONG TERM GOAL #1   Title Patient will report symptoms not extending past right buttock to manifest improved healing and decreased pain    Baseline L4-L5 dermatome outlined, 6/10 pain    Time 6    Period Weeks    Status New    Target Date 07/24/20      PT LONG TERM GOAL #2   Title Patient will improve on FOTO score to meet predicted outcomes to improve functional independence    Baseline 55% function    Time 6    Period Weeks    Status New    Target Date 07/24/20      PT LONG TERM GOAL #3   Title Patient will demo 3+/5 trunk strength to improve proximal stability    Baseline 2+/5, poor contraction palpated in lumbar erectors and multifidi    Time 6    Period Weeks    Status New    Target Date 07/24/20                 Plan - 07/05/20 1131    Clinical Impression Statement Began session with excursions to improve lumbar/hip mobility.  Added standing hip strengthening exercises with general cues for form but able to complete without difficulty.  Noted reduced ROM on Rt as compared to Lt due to weakness.  Vectors also added to improve glute strength/stability of LE's with cues for remaining upright during lateral and posterior movements.  Spasms have reduced and pt is overall improving.    Personal Factors and Comorbidities Time since onset of injury/illness/exacerbation;Profession;Fitness    Examination-Activity Limitations Bend;Caring for Others;Carry;Lift;Stand;Locomotion Level    Examination-Participation Restrictions Cleaning;Community Activity;Laundry;Yard Work;Occupation    Stability/Clinical Decision Making Stable/Uncomplicated    Rehab  Potential Good    PT Frequency 2x / week    PT Duration 6 weeks  PT Treatment/Interventions ADLs/Self Care Home Management;Aquatic Therapy;Cryotherapy;DME Instruction;Traction;Gait training;Stair training;Functional mobility training;Therapeutic activities;Therapeutic exercise;Balance training;Patient/family education;Neuromuscular re-education;Manual techniques;Passive range of motion;Taping;Energy conservation;Dry needling;Spinal Manipulations;Joint Manipulations    PT Next Visit Plan Progress to standing functional strengthening.  Begin lunges and postural strengthening.    PT Home Exercise Plan prone with support, ab set; 06/14/20 - prone lying, prone on elbows, prone press up, isometric abdominal set, pelvic tilt, log roll, body mechanics seated, sleeping; 06/19/20: bridge; 3/3: decompression 3/14: decompression with RTB    Consulted and Agree with Plan of Care Patient           Patient will benefit from skilled therapeutic intervention in order to improve the following deficits and impairments:  Decreased activity tolerance,Decreased endurance,Decreased range of motion,Decreased strength,Hypomobility,Difficulty walking,Impaired perceived functional ability,Postural dysfunction,Improper body mechanics,Pain  Visit Diagnosis: Chronic bilateral low back pain with right-sided sciatica  Muscle weakness (generalized)  Radiculopathy, lumbar region     Problem List Patient Active Problem List   Diagnosis Date Noted  . Encounter for screening fecal occult blood testing 01/02/2020  . Encounter for well woman exam with routine gynecological exam 01/02/2020  . Migraine without aura and without status migrainosus, not intractable 10/27/2017  . Encounter for menstrual regulation 10/27/2017  . Encounter for gynecological examination with Papanicolaou smear of cervix 10/27/2017  . Screening for colorectal cancer 10/27/2017  . Musculoskeletal pain 08/21/2015  . Superficial thrombophlebitis in  puerperium 08/06/2015  . Status post repeat low transverse cesarean section 07/30/2015  . Consultation for female sterilization 07/17/2015  . Left ovarian cyst 03/12/2015  . Rubella non-immune status, antepartum 11/08/2013  . Other and unspecified ovarian cyst 03/30/2013  . Internal hernia 03/15/2013  . Abdominal pain 03/14/2013  . Hx of migraines 10/26/2012   Teena Irani, PTA/CLT [phone]  Teena Irani 07/05/2020, 11:34 AM  [place] [facility] [address] [place], [place], [address] Phone: [phone]   Fax:  [phone]  Name: Kristen Dunn MRN: [id number] Date of Birth: 1975-12-13

## 2020-07-09 ENCOUNTER — Other Ambulatory Visit: Payer: Self-pay

## 2020-07-09 ENCOUNTER — Encounter (HOSPITAL_COMMUNITY): Payer: Self-pay

## 2020-07-09 ENCOUNTER — Ambulatory Visit (HOSPITAL_COMMUNITY): Payer: BC Managed Care – PPO

## 2020-07-09 DIAGNOSIS — M5441 Lumbago with sciatica, right side: Secondary | ICD-10-CM | POA: Diagnosis not present

## 2020-07-09 DIAGNOSIS — M5416 Radiculopathy, lumbar region: Secondary | ICD-10-CM | POA: Diagnosis not present

## 2020-07-09 DIAGNOSIS — G8929 Other chronic pain: Secondary | ICD-10-CM

## 2020-07-09 DIAGNOSIS — M6281 Muscle weakness (generalized): Secondary | ICD-10-CM | POA: Diagnosis not present

## 2020-07-09 NOTE — Therapy (Signed)
[place], [place], [address] Phone: [phone]   Fax:  [phone]  Physical Therapy Treatment  Patient Details  Name: Kristen Dunn MRN: [id number] Date of Birth: May 21, 1975 Referring Provider (PT): Doreatha Martin, MD   Encounter Date: 07/09/2020   PT End of Session - 07/09/20 1054    Visit Number 7    Number of Visits 12    Date for PT Re-Evaluation 07/24/20    Authorization Type [employer] Comm PPO, no auth, 30 VL    Authorization - Visit Number 7    Authorization - Number of Visits 30    Progress Note Due on Visit 10    PT Start Time 1057    PT Stop Time 1145    PT Time Calculation (min) 48 min    Activity Tolerance Patient tolerated treatment well;No increased pain;Patient limited by pain    Behavior During Therapy [facility] for tasks assessed/performed           Past Medical History:  Diagnosis Date  . AMA (advanced maternal age) multigravida 69+   . Anemia   . Asthma   . Degenerative lumbar disc   . Family history of adverse reaction to anesthesia    mother has nausea  . Gestational diabetes    glyburide  . Hx of migraines 10/26/2012  . Other and unspecified ovarian cyst 03/30/2013  . PONV (postoperative nausea and vomiting)     Past Surgical History:  Procedure Laterality Date  . CESAREAN SECTION  01/13/2012   Procedure: CESAREAN SECTION;  Surgeon: Mora Bellman, MD;  Location: [facility];  Service: Obstetrics;  Laterality: N/A;  . CESAREAN SECTION WITH BILATERAL TUBAL LIGATION Bilateral 07/30/2015   Procedure: REPEAT CESAREAN SECTION WITH BILATERAL TUBAL LIGATION, WIDE EXCISION OF CICHEIX;  Surgeon: Jonnie Kind, MD;  Location: [facility];  Service: Obstetrics;  Laterality: Bilateral;  . Gastric Bypass 2008    . INCISIONAL HERNIA REPAIR  03/31/2012   Procedure: LAPAROSCOPIC INCISIONAL HERNIA;  Surgeon: Edward Jolly, MD;  Location: [facility];  Service: General;  Laterality: N/A;  LAPAROSCOPY REPAIR  INTERNAL HERNIA  petersons defect  . LAPAROSCOPIC GASTRIC BANDING WITH HIATAL HERNIA REPAIR N/A 03/14/2013   Procedure: DIAGNOSTIC LAPAROSCOPY  WITH  INTERNAL HERNIA  REDUCTION WITH CLOSURE;  Surgeon: Shann Medal, MD;  Location: [facility];  Service: General;  Laterality: N/A;  . TONSILLECTOMY      There were no vitals filed for this visit.   Subjective Assessment - 07/09/20 1054    Subjective Patient states muscle in her left hip is what continues to bother her. Gets regular massages to help. Pain down R leg only 3-4 times per week now. Performs her HEP as able. McKenzie exercises on her back are more consistenly done for pain management.    Currently in Pain? Yes    Pain Score 3     Pain Location Hip    Pain Orientation Left;Posterior           [facility] Adult PT Treatment/Exercise - 07/09/20 0001      Lumbar Exercises: Standing   Other Standing Lumbar Exercises SLS w/ vectors w/ UE support    Other Standing Lumbar Exercises Paloff press GTB tandem stance x10 each      Lumbar Exercises: Supine   Dead Bug 10 reps;2 seconds    Bridge Compliant;10 reps;3 seconds    Bridge with March --    Single Leg Bridge Non-compliant;5 reps;3 seconds  Bridge with [employer] Limitations 2 sets      Lumbar Exercises: Sidelying   Clam 10 reps;3 seconds    Clam Limitations core activation RTB around thigh    Hip Abduction Both;5 reps;2 seconds    Hip Abduction Limitations 2 sets      Lumbar Exercises: Prone   Straight Leg Raise 10 reps;3 seconds            PT Education - 07/09/20 1143    Education Details Discussed purpose and technique of interventions throughout session. Advanced HEP.    Person(s) Educated Patient    Methods Explanation;Handout    Comprehension Verbalized understanding            PT Short Term Goals - 07/09/20 1056      PT SHORT TERM GOAL #1   Title Patient will report at least 25% improvement in symptoms for improved quality of life with centralization of RLE  symptoms    Baseline 07/09/20 - 50-60% better with RLE radic symptoms maybe 3-4 times per week.    Time 3    Period Weeks    Status Partially Met    Target Date 07/03/20      PT SHORT TERM GOAL #2   Title Patient will be independent with HEP in order to improve functional outcomes.    Time 3    Period Weeks    Status Achieved    Target Date 07/03/20      PT SHORT TERM GOAL #3   Title Patient will demonstrate hip extension strength of 3+/5 to improve lumbopelvic strength to facilitate improved body mechanics    Baseline 07/09/20 - Patient exhibited 3+/5 strength performing prone hip extension and sidelying hip abduction during therapeutic exercises today.    Time 3    Period Weeks    Status Achieved    Target Date 07/03/20             PT Long Term Goals - 07/09/20 1056      PT LONG TERM GOAL #1   Title Patient will report symptoms not extending past right buttock to manifest improved healing and decreased pain    Baseline L4-L5 dermatome outlined, 6/10 pain    Time 6    Period Weeks    Status On-going      PT LONG TERM GOAL #2   Title Patient will improve on FOTO score to meet predicted outcomes to improve functional independence    Baseline 55% function    Time 6    Period Weeks    Status On-going      PT LONG TERM GOAL #3   Title Patient will demo 3+/5 trunk strength to improve proximal stability    Baseline 2+/5, poor contraction palpated in lumbar erectors and multifidi    Time 6    Period Weeks    Status On-going             Plan - 07/09/20 1055    Clinical Impression Statement Began session with palpations of left glut/piriformis region. Tender to palpation over left piriformis muscle belly. Performed therapeutic exercises in supine, sidelying and prone. Added prone hip extension, sidelying abduction; advance supine bridge to perform on physioball and supine single leg bridge on solid surface. Standing exercises of SLS with vectors with UE support and Paloff  press in tandem stance with green theraband. Patient required verbal and tactile cues to maintain support spine with ab set prior to all activities and exercises for proper performance. Patient  reported fatigue at end of session but no pain.    Personal Factors and Comorbidities Time since onset of injury/illness/exacerbation;Profession;Fitness    Examination-Activity Limitations Bend;Caring for Others;Carry;Lift;Stand;Locomotion Level    Examination-Participation Restrictions Cleaning;Community Activity;Laundry;Yard Work;Occupation    Stability/Clinical Decision Making Stable/Uncomplicated    Rehab Potential Good    PT Frequency 2x / week    PT Duration 6 weeks    PT Treatment/Interventions ADLs/Self Care Home Management;Aquatic Therapy;Cryotherapy;DME Instruction;Traction;Gait training;Stair training;Functional mobility training;Therapeutic activities;Therapeutic exercise;Balance training;Patient/family education;Neuromuscular re-education;Manual techniques;Passive range of motion;Taping;Energy conservation;Dry needling;Spinal Manipulations;Joint Manipulations    PT Next Visit Plan Progress to standing functional strengthening.  Begin lunges and postural strengthening.    PT Home Exercise Plan prone with support, ab set; 06/14/20 - prone lying, prone on elbows, prone press up, isometric abdominal set, pelvic tilt, log roll, body mechanics seated, sleeping; 06/19/20: bridge; 3/3: decompression 3/14: decompression with RTB; 07/09/20 - prone hip extension, sidelying hip abd, prone heel squeeze    Consulted and Agree with Plan of Care Patient           Patient will benefit from skilled therapeutic intervention in order to improve the following deficits and impairments:  Decreased activity tolerance,Decreased endurance,Decreased range of motion,Decreased strength,Hypomobility,Difficulty walking,Impaired perceived functional ability,Postural dysfunction,Improper body mechanics,Pain  Visit  Diagnosis: Chronic bilateral low back pain with right-sided sciatica  Muscle weakness (generalized)  Radiculopathy, lumbar region     Problem List Patient Active Problem List   Diagnosis Date Noted  . Encounter for screening fecal occult blood testing 01/02/2020  . Encounter for well woman exam with routine gynecological exam 01/02/2020  . Migraine without aura and without status migrainosus, not intractable 10/27/2017  . Encounter for menstrual regulation 10/27/2017  . Encounter for gynecological examination with Papanicolaou smear of cervix 10/27/2017  . Screening for colorectal cancer 10/27/2017  . Musculoskeletal pain 08/21/2015  . Superficial thrombophlebitis in puerperium 08/06/2015  . Status post repeat low transverse cesarean section 07/30/2015  . Consultation for female sterilization 07/17/2015  . Left ovarian cyst 03/12/2015  . Rubella non-immune status, antepartum 11/08/2013  . Other and unspecified ovarian cyst 03/30/2013  . Internal hernia 03/15/2013  . Abdominal pain 03/14/2013  . Hx of migraines 10/26/2012   Floria Raveling. Hartnett-Rands, MS, PT Per [place] [phone]  Jeannie Done 07/09/2020, 12:02 PM  [facility] [address] [place], [place], [address] Phone: [phone]   Fax:  [phone]  Name: TERESEA DONLEY MRN: [id number] Date of Birth: 28-Oct-1975

## 2020-07-09 NOTE — Patient Instructions (Addendum)
Strengthening: Hip Abduction (Side-Lying)    Tighten muscles on front of left thigh, then lift leg 12 inches or more_ inches from surface, keeping knee locked.  Repeat _5-15_ times per set. Do _1-3_ sets per session. Do _1_ sessions per day.  http://orth.exer.us/623   Copyright  VHI. All rights reserved.   Hip Extension: 2-4 Inches    Pillow under pelvis. Toes down. Tighten gluteal muscle. Lift one leg _5-15_ times. Restabilize pelvis. Repeat with other leg. Keep pelvis still. Be sure pelvis does not rotate and back does not arch. Do 2-3_ sets, _1_ times per day.  http://ss.exer.us/63   Copyright  VHI. All rights reserved.   Heel Squeeze (Prone)    Abdomen supported, bend knees and gently squeeze heels together. Hold _3-5_ seconds. Repeat _10-15___ times per set. Do _1_ sets per session. Do _1_ sessions per day.  http://orth.exer.us/1081   Copyright  VHI. All rights reserved.

## 2020-07-12 ENCOUNTER — Other Ambulatory Visit: Payer: Self-pay

## 2020-07-12 ENCOUNTER — Ambulatory Visit (HOSPITAL_COMMUNITY): Payer: BC Managed Care – PPO

## 2020-07-12 DIAGNOSIS — M5416 Radiculopathy, lumbar region: Secondary | ICD-10-CM

## 2020-07-12 DIAGNOSIS — M6281 Muscle weakness (generalized): Secondary | ICD-10-CM | POA: Diagnosis not present

## 2020-07-12 DIAGNOSIS — G8929 Other chronic pain: Secondary | ICD-10-CM

## 2020-07-12 DIAGNOSIS — M5441 Lumbago with sciatica, right side: Secondary | ICD-10-CM

## 2020-07-12 NOTE — Therapy (Signed)
[place], [place], [address] Phone: [phone]   Fax:  [phone]  Physical Therapy Treatment  Patient Details  Name: Kristen Dunn MRN: [id number] Date of Birth: November 15, 1975 Referring Provider (PT): Doreatha Martin, MD   Encounter Date: 07/12/2020   PT End of Session - 07/12/20 1156    Visit Number 8    Number of Visits 12    Date for PT Re-Evaluation 07/24/20    Authorization Type BCBS Comm PPO, no auth, 30 VL    Authorization - Visit Number 8    Authorization - Number of Visits 30    Progress Note Due on Visit 10    PT Start Time 1100    PT Stop Time 1147    PT Time Calculation (min) 47 min    Activity Tolerance Patient tolerated treatment well;No increased pain;Patient limited by pain    Behavior During Therapy [facility] for tasks assessed/performed           Past Medical History:  Diagnosis Date  . AMA (advanced maternal age) multigravida 30+   . Anemia   . Asthma   . Degenerative lumbar disc   . Family history of adverse reaction to anesthesia    mother has nausea  . Gestational diabetes    glyburide  . Hx of migraines 10/26/2012  . Other and unspecified ovarian cyst 03/30/2013  . PONV (postoperative nausea and vomiting)     Past Surgical History:  Procedure Laterality Date  . CESAREAN SECTION  01/13/2012   Procedure: CESAREAN SECTION;  Surgeon: Mora Bellman, MD;  Location: [facility];  Service: Obstetrics;  Laterality: N/A;  . CESAREAN SECTION WITH BILATERAL TUBAL LIGATION Bilateral 07/30/2015   Procedure: REPEAT CESAREAN SECTION WITH BILATERAL TUBAL LIGATION, WIDE EXCISION OF CICHEIX;  Surgeon: Jonnie Kind, MD;  Location: [facility];  Service: Obstetrics;  Laterality: Bilateral;  . Gastric Bypass 2008    . INCISIONAL HERNIA REPAIR  03/31/2012   Procedure: LAPAROSCOPIC INCISIONAL HERNIA;  Surgeon: Edward Jolly, MD;  Location: [facility];  Service: General;  Laterality: N/A;  LAPAROSCOPY REPAIR  INTERNAL HERNIA  petersons defect  . LAPAROSCOPIC GASTRIC BANDING WITH HIATAL HERNIA REPAIR N/A 03/14/2013   Procedure: DIAGNOSTIC LAPAROSCOPY  WITH  INTERNAL HERNIA  REDUCTION WITH CLOSURE;  Surgeon: Shann Medal, MD;  Location: [facility];  Service: General;  Laterality: N/A;  . TONSILLECTOMY      There were no vitals filed for this visit.   Subjective Assessment - 07/12/20 1148    Subjective Patient reports compliant with HEP. Was sore after last session but completed some of her HEP exerciess the next day and felt better.    Currently in Pain? Yes    Pain Score 3     Pain Location Hip    Pain Orientation Left;Posterior;Lateral    Pain Descriptors / Indicators Burning            [facility] Adult PT Treatment/Exercise - 07/12/20 0001      Lumbar Exercises: Standing   Other Standing Lumbar Exercises SLS w/ vectors w/ UE support    Other Standing Lumbar Exercises Paloff press GTB tandem stance 2x10 each      Lumbar Exercises: Supine   Bridge Compliant;10 reps;3 seconds   65 cm physioball   Bridge Limitations 2 sets    Other Supine Lumbar Exercises opposite arm/leg raise x10 each      Lumbar Exercises: Sidelying   Hip Abduction Both;10 reps  Other Sidelying Lumbar Exercises plank on knees 15 sec x3 each      Lumbar Exercises: Quadruped   Single Arm Raise Right;Left;10 reps    Single Arm Raises Limitations 1 set    Plank on knees 30 sec x3             PT Education - 07/12/20 1142    Education Details Discussed purpose and technique of interventions throughout session. Advanced HEP.    Person(s) Educated Patient    Methods Explanation;Handout    Comprehension Verbalized understanding            PT Short Term Goals - 07/09/20 1056      PT SHORT TERM GOAL #1   Title Patient will report at least 25% improvement in symptoms for improved quality of life with centralization of RLE symptoms    Baseline 07/09/20 - 50-60% better with RLE radic symptoms maybe 3-4 times per  week.    Time 3    Period Weeks    Status Partially Met    Target Date 07/03/20      PT SHORT TERM GOAL #2   Title Patient will be independent with HEP in order to improve functional outcomes.    Time 3    Period Weeks    Status Achieved    Target Date 07/03/20      PT SHORT TERM GOAL #3   Title Patient will demonstrate hip extension strength of 3+/5 to improve lumbopelvic strength to facilitate improved body mechanics    Baseline 07/09/20 - Patient exhibited 3+/5 strength performing prone hip extension and sidelying hip abduction during therapeutic exercises today.    Time 3    Period Weeks    Status Achieved    Target Date 07/03/20             PT Long Term Goals - 07/09/20 1056      PT LONG TERM GOAL #1   Title Patient will report symptoms not extending past right buttock to manifest improved healing and decreased pain    Baseline L4-L5 dermatome outlined, 6/10 pain    Time 6    Period Weeks    Status On-going      PT LONG TERM GOAL #2   Title Patient will improve on FOTO score to meet predicted outcomes to improve functional independence    Baseline 55% function    Time 6    Period Weeks    Status On-going      PT LONG TERM GOAL #3   Title Patient will demo 3+/5 trunk strength to improve proximal stability    Baseline 2+/5, poor contraction palpated in lumbar erectors and multifidi    Time 6    Period Weeks    Status On-going             Plan - 07/12/20 1155    Clinical Impression Statement Session focused on core/trunk strengthening and therapeutic exercises. Added prone and side-lying on knees plank and quadraped arm raise to therapeutic exercises. Patient did state this position was uncomfortable for her wrists. Added prone and sidelying planks to HEP. Patient showed good carry-over for HEP and previously completed therapeutic exercises. Patient required cues for proper positioning for sidelying abduction. Patient required modeling and cues for neutral  spine for quadraped exercise. Patient reported a slight improvement in left hip pain at end of session and feeling a little looser. Discuss re-evaluation in 2 visits and potential discharge to a HEP.    Personal Factors  and Comorbidities Time since onset of injury/illness/exacerbation;Profession;Fitness    Examination-Activity Limitations Bend;Caring for Others;Carry;Lift;Stand;Locomotion Level    Examination-Participation Restrictions Cleaning;Community Activity;Laundry;Yard Work;Occupation    Stability/Clinical Decision Making Stable/Uncomplicated    Rehab Potential Good    PT Frequency 2x / week    PT Duration 6 weeks    PT Treatment/Interventions ADLs/Self Care Home Management;Aquatic Therapy;Cryotherapy;DME Instruction;Traction;Gait training;Stair training;Functional mobility training;Therapeutic activities;Therapeutic exercise;Balance training;Patient/family education;Neuromuscular re-education;Manual techniques;Passive range of motion;Taping;Energy conservation;Dry needling;Spinal Manipulations;Joint Manipulations    PT Next Visit Plan Progress to standing functional strengthening.  Begin lunges and postural strengthening.    PT Home Exercise Plan prone with support, ab set; 06/14/20 - prone lying, prone on elbows, prone press up, isometric abdominal set, pelvic tilt, log roll, body mechanics seated, sleeping; 06/19/20: bridge; 3/3: decompression 3/14: decompression with RTB; 07/09/20 - prone hip extension, sidelying hip abd, prone heel squeeze; 07/12/20 - prone and sidelying planks on knees    Consulted and Agree with Plan of Care Patient           Patient will benefit from skilled therapeutic intervention in order to improve the following deficits and impairments:  Decreased activity tolerance,Decreased endurance,Decreased range of motion,Decreased strength,Hypomobility,Difficulty walking,Impaired perceived functional ability,Postural dysfunction,Improper body mechanics,Pain  Visit  Diagnosis: Chronic bilateral low back pain with right-sided sciatica  Muscle weakness (generalized)  Radiculopathy, lumbar region   Problem List Patient Active Problem List   Diagnosis Date Noted  . Encounter for screening fecal occult blood testing 01/02/2020  . Encounter for well woman exam with routine gynecological exam 01/02/2020  . Migraine without aura and without status migrainosus, not intractable 10/27/2017  . Encounter for menstrual regulation 10/27/2017  . Encounter for gynecological examination with Papanicolaou smear of cervix 10/27/2017  . Screening for colorectal cancer 10/27/2017  . Musculoskeletal pain 08/21/2015  . Superficial thrombophlebitis in puerperium 08/06/2015  . Status post repeat low transverse cesarean section 07/30/2015  . Consultation for female sterilization 07/17/2015  . Left ovarian cyst 03/12/2015  . Rubella non-immune status, antepartum 11/08/2013  . Other and unspecified ovarian cyst 03/30/2013  . Internal hernia 03/15/2013  . Abdominal pain 03/14/2013  . Hx of migraines 10/26/2012   Floria Raveling. Hartnett-Rands, MS, PT Per [place] [phone]  Jeannie Done 07/12/2020, 11:59 AM  [place], [place], [address] Phone: [phone]   Fax:  [phone]  Name: SUTTYN CRYDER MRN: [id number] Date of Birth: 11-Jul-1975

## 2020-07-12 NOTE — Patient Instructions (Addendum)
Prone Plank (Eccentric)    On toes and elbows, pull abdomen in while stabilizing trunk. Slowly lower downward without arching back. 10-30 second hold, 2-3_ reps per set, _1 sets per day.  http://ecce.exer.us/243   Copyright  VHI. All rights reserved.    Side Plank    Side plank on knees hold for 10-30 seconds x2-3 sets on each side  ALL THIS IS FOR LATER!!!! From side, press up on one arm and side of foot. Extend other arm up. Hold for ____ breaths. Repeat on other side. ADVANCED: Grasp big toe of top leg, extend leg up.  Copyright  VHI. All rights reserved.

## 2020-07-16 ENCOUNTER — Ambulatory Visit (HOSPITAL_COMMUNITY): Payer: BC Managed Care – PPO

## 2020-07-16 ENCOUNTER — Other Ambulatory Visit: Payer: Self-pay

## 2020-07-16 DIAGNOSIS — M5416 Radiculopathy, lumbar region: Secondary | ICD-10-CM | POA: Diagnosis not present

## 2020-07-16 DIAGNOSIS — G8929 Other chronic pain: Secondary | ICD-10-CM | POA: Diagnosis not present

## 2020-07-16 DIAGNOSIS — M6281 Muscle weakness (generalized): Secondary | ICD-10-CM | POA: Diagnosis not present

## 2020-07-16 DIAGNOSIS — M5441 Lumbago with sciatica, right side: Secondary | ICD-10-CM

## 2020-07-16 NOTE — Therapy (Signed)
[place], [place], [address] Phone: [phone]   Fax:  [phone]  Physical Therapy Treatment and D/C Summary  Patient Details  Name: Kristen Dunn MRN: [id number] Date of Birth: April 19, 1976 Referring Provider (PT): Doreatha Martin, MD  PHYSICAL THERAPY DISCHARGE SUMMARY  Visits from Start of Care: 9  Current functional level related to goals / functional outcomes: Pt able to meet all STG/LTG   Remaining deficits: Residual pain in left buttocks but this has improved and is tolerable per pt report   Education / Equipment: Comprehensive HEP Plan: Patient agrees to discharge.  Patient goals were partially met. Patient is being discharged due to meeting the stated rehab goals.  ?????      Encounter Date: 07/16/2020   PT End of Session - 07/16/20 1114    Visit Number 9    Number of Visits 12    Date for PT Re-Evaluation 07/24/20    Authorization Type BCBS Comm PPO, no auth, 30 VL    Authorization - Visit Number 9    Authorization - Number of Visits 30    Progress Note Due on Visit 10    PT Start Time 1115    PT Stop Time 1200    PT Time Calculation (min) 45 min    Activity Tolerance Patient tolerated treatment well;No increased pain;Patient limited by pain    Behavior During Therapy [facility] for tasks assessed/performed           Past Medical History:  Diagnosis Date  . AMA (advanced maternal age) multigravida 47+   . Anemia   . Asthma   . Degenerative lumbar disc   . Family history of adverse reaction to anesthesia    mother has nausea  . Gestational diabetes    glyburide  . Hx of migraines 10/26/2012  . Other and unspecified ovarian cyst 03/30/2013  . PONV (postoperative nausea and vomiting)     Past Surgical History:  Procedure Laterality Date  . CESAREAN SECTION  01/13/2012   Procedure: CESAREAN SECTION;  Surgeon: Mora Bellman, MD;  Location: [facility];  Service: Obstetrics;  Laterality:  N/A;  . CESAREAN SECTION WITH BILATERAL TUBAL LIGATION Bilateral 07/30/2015   Procedure: REPEAT CESAREAN SECTION WITH BILATERAL TUBAL LIGATION, WIDE EXCISION OF CICHEIX;  Surgeon: Jonnie Kind, MD;  Location: [facility];  Service: Obstetrics;  Laterality: Bilateral;  . Gastric Bypass 2008    . INCISIONAL HERNIA REPAIR  03/31/2012   Procedure: LAPAROSCOPIC INCISIONAL HERNIA;  Surgeon: Edward Jolly, MD;  Location: [facility];  Service: General;  Laterality: N/A;  LAPAROSCOPY REPAIR INTERNAL HERNIA  petersons defect  . LAPAROSCOPIC GASTRIC BANDING WITH HIATAL HERNIA REPAIR N/A 03/14/2013   Procedure: DIAGNOSTIC LAPAROSCOPY  WITH  INTERNAL HERNIA  REDUCTION WITH CLOSURE;  Surgeon: Shann Medal, MD;  Location: [facility];  Service: General;  Laterality: N/A;  . TONSILLECTOMY      There were no vitals filed for this visit.   Subjective Assessment - 07/16/20 1125    Subjective Patient reports compliant with HEP and notes pain in LLE is largely isolated to the buttock and does not radiate into lower leg    Currently in Pain? Yes    Pain Score 2     Pain Location Hip    Pain Orientation Right;Posterior              [facility] PT Assessment - 07/16/20 0001      Assessment   Medical Diagnosis  lumbar radiculopathy    Referring Provider (PT) Doreatha Martin, MD                         [facility] Adult PT Treatment/Exercise - 07/16/20 0001      Lumbar Exercises: Standing   Other Standing Lumbar Exercises functional lifts with boxes 2x10 reps. Golfer's pickup 1x10      Lumbar Exercises: Prone   Other Prone Lumbar Exercises modified plank 3x5 sec                  PT Education - 07/16/20 1157    Education Details education and discussion regarding POC details and discussion regarding progress. Pt feels confident and capable of self-progression with HEP on her own at this time and is ready for D/C to HEP    Person(s) Educated Patient    Methods Explanation;Demonstration     Comprehension Verbalized understanding;Returned demonstration            PT Short Term Goals - 07/16/20 1144      PT SHORT TERM GOAL #1   Title Patient will report at least 25% improvement in symptoms for improved quality of life with centralization of RLE symptoms    Baseline Pt reports 75% improvemnt in LE pain    Time 3    Period Weeks    Status Achieved    Target Date 07/03/20      PT SHORT TERM GOAL #2   Title Patient will be independent with HEP in order to improve functional outcomes.    Time 3    Period Weeks    Status Achieved    Target Date 07/03/20      PT SHORT TERM GOAL #3   Title Patient will demonstrate hip extension strength of 3+/5 to improve lumbopelvic strength to facilitate improved body mechanics    Baseline 07/09/20 - Patient exhibited 3+/5 strength performing prone hip extension and sidelying hip abduction during therapeutic exercises today.    Time 3    Period Weeks    Status Achieved    Target Date 07/03/20             PT Long Term Goals - 07/16/20 1145      PT LONG TERM GOAL #1   Title Patient will report symptoms not extending past right buttock to manifest improved healing and decreased pain    Baseline pain is isolated to left buttock with c/o soreness rather than pain, 3/10    Time 6    Period Weeks    Status Achieved      PT LONG TERM GOAL #2   Title Patient will improve on FOTO score to meet predicted outcomes to improve functional independence    Baseline 74% function    Time 6    Period Weeks    Status Achieved      PT LONG TERM GOAL #3   Title Patient will demo 3+/5 trunk strength to improve proximal stability    Baseline 3+/5 as evidenced by exercise forms and isometric holds (planks, side planks, functional lifts)    Time 6    Period Weeks    Status Achieved                 Plan - 07/16/20 1158    Clinical Impression Statement Pt demonstrates good centralization of symptoms and improved return demonstration of  functional lifts.  Education and discussion in [place] details and meeting STG/LTG and demo improved strength/stability  and is obviously compliant with HEP.  Pt feels improved symptoms and confident in self-managment at this time    Personal Factors and Comorbidities Time since onset of injury/illness/exacerbation;Profession;Fitness    Examination-Activity Limitations Bend;Caring for Others;Carry;Lift;Stand;Locomotion Level    Examination-Participation Restrictions Cleaning;Community Activity;Laundry;Yard Work;Occupation    Stability/Clinical Decision Making Stable/Uncomplicated    Rehab Potential Good    PT Frequency 2x / week    PT Duration 6 weeks    PT Treatment/Interventions ADLs/Self Care Home Management;Aquatic Therapy;Cryotherapy;DME Instruction;Traction;Gait training;Stair training;Functional mobility training;Therapeutic activities;Therapeutic exercise;Balance training;Patient/family education;Neuromuscular re-education;Manual techniques;Passive range of motion;Taping;Energy conservation;Dry needling;Spinal Manipulations;Joint Manipulations    PT Next Visit Plan D/C to HEP    PT Home Exercise Plan prone with support, ab set; 06/14/20 - prone lying, prone on elbows, prone press up, isometric abdominal set, pelvic tilt, log roll, body mechanics seated, sleeping; 06/19/20: bridge; 3/3: decompression 3/14: decompression with RTB; 07/09/20 - prone hip extension, sidelying hip abd, prone heel squeeze; 07/12/20 - prone and sidelying planks on knees    Consulted and Agree with Plan of Care Patient           Patient will benefit from skilled therapeutic intervention in order to improve the following deficits and impairments:  Decreased activity tolerance,Decreased endurance,Decreased range of motion,Decreased strength,Hypomobility,Difficulty walking,Impaired perceived functional ability,Postural dysfunction,Improper body mechanics,Pain  Visit Diagnosis: Chronic bilateral low back pain with right-sided  sciatica  Muscle weakness (generalized)  Radiculopathy, lumbar region     Problem List Patient Active Problem List   Diagnosis Date Noted  . Encounter for screening fecal occult blood testing 01/02/2020  . Encounter for well woman exam with routine gynecological exam 01/02/2020  . Migraine without aura and without status migrainosus, not intractable 10/27/2017  . Encounter for menstrual regulation 10/27/2017  . Encounter for gynecological examination with Papanicolaou smear of cervix 10/27/2017  . Screening for colorectal cancer 10/27/2017  . Musculoskeletal pain 08/21/2015  . Superficial thrombophlebitis in puerperium 08/06/2015  . Status post repeat low transverse cesarean section 07/30/2015  . Consultation for female sterilization 07/17/2015  . Left ovarian cyst 03/12/2015  . Rubella non-immune status, antepartum 11/08/2013  . Other and unspecified ovarian cyst 03/30/2013  . Internal hernia 03/15/2013  . Abdominal pain 03/14/2013  . Hx of migraines 10/26/2012   12:02 PM, 07/16/20 M. Sherlyn Lees, PT, DPT Physical Therapist- [place] Office Number: [phone]  [place] [address] [place], [place], [address] Phone: [phone]   Fax:  [phone]  Name: Kristen Dunn MRN: [id number] Date of Birth: 1975-08-17

## 2020-07-19 ENCOUNTER — Encounter (HOSPITAL_COMMUNITY): Payer: BC Managed Care – PPO

## 2020-07-24 ENCOUNTER — Ambulatory Visit (HOSPITAL_COMMUNITY): Payer: BC Managed Care – PPO

## 2020-08-20 ENCOUNTER — Other Ambulatory Visit: Payer: Self-pay | Admitting: Adult Health

## 2020-10-15 ENCOUNTER — Telehealth: Payer: Self-pay

## 2020-10-15 NOTE — Telephone Encounter (Signed)
Patient called she states when she got her birth control refilled last time it took longer than expected and she will run out early she is asking if we possibly have samples or if we could call in more refills for her ph# [phone]

## 2020-10-16 MED ORDER — NORETHIN ACE-ETH ESTRAD-FE 1-20 MG-MCG PO TABS: 1.0000 | ORAL_TABLET | Freq: Every day | ORAL | 0 refills | Status: DC

## 2020-10-16 NOTE — Addendum Note (Signed)
Addended by: Derrek Monaco A on: 10/16/2020 12:54 PM   Modules accepted: Orders

## 2020-10-16 NOTE — Telephone Encounter (Signed)
X sent to [facility] for Jl 1/20 1 pack

## 2020-11-13 ENCOUNTER — Ambulatory Visit (INDEPENDENT_AMBULATORY_CARE_PROVIDER_SITE_OTHER): Payer: BC Managed Care – PPO | Admitting: Physician Assistant

## 2020-11-13 ENCOUNTER — Other Ambulatory Visit: Payer: Self-pay

## 2020-11-13 ENCOUNTER — Encounter: Payer: Self-pay | Admitting: Physician Assistant

## 2020-11-13 DIAGNOSIS — Z1283 Encounter for screening for malignant neoplasm of skin: Secondary | ICD-10-CM

## 2020-11-13 DIAGNOSIS — L821 Other seborrheic keratosis: Secondary | ICD-10-CM | POA: Diagnosis not present

## 2020-11-15 ENCOUNTER — Encounter: Payer: Self-pay | Admitting: Physician Assistant

## 2020-11-15 NOTE — Progress Notes (Signed)
   New Patient   Subjective  Kristen Dunn is a 45 y.o. female who presents for the following: Annual Exam (Left cheek lesion that comes and goes x months ).   The following portions of the chart were reviewed this encounter and updated as appropriate:  Tobacco  Allergies  Meds  Problems  Med Hx  Surg Hx  Fam Hx      Objective  Well appearing patient in no apparent distress; mood and affect are within normal limits.  A full examination was performed including scalp, head, eyes, ears, nose, lips, neck, chest, axillae, abdomen, back, buttocks, bilateral upper extremities, bilateral lower extremities, hands, feet, fingers, toes, fingernails, and toenails. All findings within normal limits unless otherwise noted below.  Head to toe No atypical nevi or signs of NMSC noted at the time of the visit.   Left Zygomatic Area Irritated papule  Assessment & Plan  Skin exam for malignant neoplasm Head to toe  Yearly skin check  Seborrheic keratosis Left Zygomatic Area  observe    I, Willie Loy, PA-C, have reviewed all documentation's for this visit.  The documentation on 11/15/20 for the exam, diagnosis, procedures and orders are all accurate and complete.

## 2020-12-25 ENCOUNTER — Other Ambulatory Visit: Payer: Self-pay

## 2020-12-25 ENCOUNTER — Ambulatory Visit
Admission: EM | Admit: 2020-12-25 | Discharge: 2020-12-25 | Disposition: A | Discharge status: Home / Self Care | Payer: BC Managed Care – PPO | Attending: Family Medicine | Admitting: Family Medicine

## 2020-12-25 ENCOUNTER — Encounter: Payer: Self-pay | Admitting: Emergency Medicine

## 2020-12-25 DIAGNOSIS — G43919 Migraine, unspecified, intractable, without status migrainosus: Secondary | ICD-10-CM

## 2020-12-25 MED ORDER — DIPHENHYDRAMINE HCL 50 MG/ML IJ SOLN
50.0000 mg | Freq: Once | INTRAMUSCULAR | Status: AC
  Administered 2020-12-25: 50 mg via INTRAMUSCULAR

## 2020-12-25 MED ORDER — SUMATRIPTAN SUCCINATE 6 MG/0.5ML ~~LOC~~ SOLN
6.0000 mg | Freq: Once | SUBCUTANEOUS | Status: AC
Start: 2020-12-25 — End: 2020-12-25
  Administered 2020-12-25: 6 mg via SUBCUTANEOUS

## 2020-12-25 MED ORDER — KETOROLAC TROMETHAMINE 30 MG/ML IJ SOLN
30.0000 mg | Freq: Once | INTRAMUSCULAR | Status: AC
  Administered 2020-12-25: 30 mg via INTRAMUSCULAR

## 2020-12-25 MED ORDER — DEXAMETHASONE SODIUM PHOSPHATE 10 MG/ML IJ SOLN
10.0000 mg | Freq: Once | INTRAMUSCULAR | Status: AC
  Administered 2020-12-25: 10 mg via INTRAMUSCULAR

## 2020-12-25 NOTE — ED Provider Notes (Signed)
[place]   [id number] 12/25/20 Arrival Time: [id number]  ASSESSMENT & PLAN:  1. Intractable migraine without status migrainosus, unspecified migraine type    Meds ordered this encounter  Medications   ketorolac (TORADOL) 30 MG/ML injection 30 mg   dexamethasone (DECADRON) injection 10 mg   SUMAtriptan (IMITREX) injection 6 mg   diphenhydrAMINE (BENADRYL) injection 50 mg   Normal neurological exam. Afebrile without nuchal rigidity. No indication for neurodiagnostic workup at this time. Discussed.  Recommend:  Follow-up Information     [facility] EMERGENCY DEPARTMENT.   Specialty: Emergency Medicine Why: If symptoms worsen in any way. Contact information: [address] [id number] Prudy Feeler [place] [id number] [phone]                 Reviewed expectations re: course of current medical issues. Questions answered. Outlined signs and symptoms indicating need for more acute intervention. Patient verbalized understanding. After Visit Summary given.   SUBJECTIVE: History from: Patient Patient is able to give a clear and coherent history.  Kristen Dunn is a 45 y.o. female who presents with complaint of a migraine/cluster generalized headache. Usu a couple of times per month; this episode past 3 weeks. Imitrex with mild relief but HA returns. Nausea without emesis. No vision changes. With light sensitivity. Denies dizziness, loss of balance, muscle weakness, numbness of extremities, and speech difficulties. No head injury reported. Ambulatory without difficulty.  OBJECTIVE:  Vitals:   12/25/20 1234 12/25/20 1235  BP: (!) 142/93   Pulse: 85   Resp: 18   Temp: 98.4 F (36.9 C)   TempSrc: Oral   SpO2: 98%   Weight:  81.6 kg  Height:  '5\' 5"'$  (1.651 m)    General appearance: alert; NAD but appears uncomfortable; prefers darkened room HENT: normocephalic; atraumatic Eyes: PERRLA; EOMI; conjunctivae normal Neck: supple with FROM Lungs:  unlabored respirations Extremities: no edema; symmetrical with no gross deformities Skin: warm and dry Neurologic: alert; speech is fluent and clear without dysarthria or aphasia; CN 2-12 grossly intact Psychological: alert and cooperative; normal mood and affect    Allergies  Allergen Reactions   Penicillins Anaphylaxis    Reaction occurred as a very small child; anaphylaxis likely but not completely certain Has patient had a PCN reaction causing immediate rash, facial/tongue/throat swelling, SOB or lightheadedness with hypotension: Yes Has patient had a PCN reaction causing severe rash involving mucus membranes or skin necrosis: No Has patient had a PCN reaction that required hospitalization No Has patient had a PCN reaction occurring within the last 10 years: No If all of the above answers are "NO", then may procee   Aspirin     Intolerance because of gastric bypass surgery Asthma flares up   Alupent [Metaproterenol] Palpitations and Other (See Comments)    hyperadrenergic tremor   Lactose Intolerance (Gi) Diarrhea   Latex Itching and Dermatitis    Past Medical History:  Diagnosis Date   AMA (advanced maternal age) multigravida 35+    Anemia    Asthma    Degenerative lumbar disc    Family history of adverse reaction to anesthesia    mother has nausea   Gestational diabetes    glyburide   Hx of migraines 10/26/2012   Other and unspecified ovarian cyst 03/30/2013   PONV (postoperative nausea and vomiting)    Social History   Socioeconomic History   Marital status: Married    Spouse name: Not on file   Number of children: Not on file  Years of education: Not on file   Highest education level: Not on file  Occupational History   Not on file  Tobacco Use   Smoking status: Never   Smokeless tobacco: Never  Vaping Use   Vaping Use: Never used  Substance and Sexual Activity   Alcohol use: Yes    Comment: rarely   Drug use: No   Sexual activity: Yes    Birth  control/protection: Surgical    Comment: tubal  Other Topics Concern   Not on file  Social History Narrative   Not on file   Social Determinants of Health   Financial Resource Strain: Low Risk    Difficulty of Paying Living Expenses: Not hard at all  Food Insecurity: No Food Insecurity   Worried About Charity fundraiser in the Last Year: Never true   Ran Out of Food in the Last Year: Never true  Transportation Needs: No Transportation Needs   Lack of Transportation (Medical): No   Lack of Transportation (Non-Medical): No  Physical Activity: Insufficiently Active   Days of Exercise per Week: 7 days   Minutes of Exercise per Session: 20 min  Stress: No Stress Concern Present   Feeling of Stress : Only a little  Social Connections: Moderately Integrated   Frequency of Communication with Friends and Family: More than three times a week   Frequency of Social Gatherings with Friends and Family: Twice a week   Attends Religious Services: More than 4 times per year   Active Member of [employer] or Organizations: No   Attends Archivist Meetings: Never   Marital Status: Married  Human resources officer Violence: Not At Risk   Fear of Current or Ex-Partner: No   Emotionally Abused: No   Physically Abused: No   Sexually Abused: No   Family History  Problem Relation Age of Onset   Cancer Mother    COPD Mother    Asthma Mother    Hypertension Father    Aneurysm Father    Asthma Sister    Cancer Maternal Aunt        breast   Breast cancer Maternal Aunt    Thyroid disease Maternal Grandmother    Stroke Maternal Grandmother    Von Willebrand disease Cousin    Past Surgical History:  Procedure Laterality Date   CESAREAN SECTION  01/13/2012   Procedure: CESAREAN SECTION;  Surgeon: Mora Bellman, MD;  Location: [facility];  Service: Obstetrics;  Laterality: N/A;   CESAREAN SECTION WITH BILATERAL TUBAL LIGATION Bilateral 07/30/2015   Procedure: REPEAT CESAREAN SECTION WITH BILATERAL  TUBAL LIGATION, WIDE EXCISION OF CICHEIX;  Surgeon: Jonnie Kind, MD;  Location: [facility];  Service: Obstetrics;  Laterality: Bilateral;   Gastric Bypass 2008     INCISIONAL HERNIA REPAIR  03/31/2012   Procedure: LAPAROSCOPIC INCISIONAL HERNIA;  Surgeon: Edward Jolly, MD;  Location: [facility];  Service: General;  Laterality: N/A;  LAPAROSCOPY REPAIR INTERNAL HERNIA  petersons defect   LAPAROSCOPIC GASTRIC BANDING WITH HIATAL HERNIA REPAIR N/A 03/14/2013   Procedure: DIAGNOSTIC LAPAROSCOPY  WITH  INTERNAL HERNIA  REDUCTION WITH CLOSURE;  Surgeon: Shann Medal, MD;  Location: [facility];  Service: General;  Laterality: N/A;   Evonnie Dawes, MD 12/25/20 1323

## 2020-12-25 NOTE — Discharge Instructions (Addendum)
Meds ordered this encounter  Medications   ketorolac (TORADOL) 30 MG/ML injection 30 mg   dexamethasone (DECADRON) injection 10 mg   SUMAtriptan (IMITREX) injection 6 mg   diphenhydrAMINE (BENADRYL) injection 50 mg

## 2020-12-25 NOTE — ED Triage Notes (Signed)
Patient c/o cluster migraine x 3 weeks.  Requesting injections.  Nausea, no vomiting.

## 2021-01-02 ENCOUNTER — Other Ambulatory Visit: Payer: Self-pay

## 2021-01-02 ENCOUNTER — Ambulatory Visit (INDEPENDENT_AMBULATORY_CARE_PROVIDER_SITE_OTHER): Payer: BC Managed Care – PPO | Admitting: Adult Health

## 2021-01-02 ENCOUNTER — Encounter: Payer: Self-pay | Admitting: Adult Health

## 2021-01-02 ENCOUNTER — Other Ambulatory Visit (HOSPITAL_COMMUNITY)
Admission: RE | Admit: 2021-01-02 | Discharge: 2021-01-02 | Disposition: A | Discharge status: Home / Self Care | Payer: BC Managed Care – PPO | Source: Ambulatory Visit | Attending: Adult Health | Admitting: Adult Health

## 2021-01-02 VITALS — BP 118/80 | HR 82 | Ht 65.0 in | Wt 186.0 lb

## 2021-01-02 DIAGNOSIS — Z01419 Encounter for gynecological examination (general) (routine) without abnormal findings: Secondary | ICD-10-CM | POA: Diagnosis not present

## 2021-01-02 DIAGNOSIS — Z7689 Persons encountering health services in other specified circumstances: Secondary | ICD-10-CM | POA: Diagnosis not present

## 2021-01-02 DIAGNOSIS — G43009 Migraine without aura, not intractable, without status migrainosus: Secondary | ICD-10-CM | POA: Diagnosis not present

## 2021-01-02 DIAGNOSIS — Z1212 Encounter for screening for malignant neoplasm of rectum: Secondary | ICD-10-CM

## 2021-01-02 DIAGNOSIS — Z1211 Encounter for screening for malignant neoplasm of colon: Secondary | ICD-10-CM

## 2021-01-02 LAB — HEMOCCULT GUIAC POC 1CARD (OFFICE): Fecal Occult Blood, POC: NEGATIVE

## 2021-01-02 MED ORDER — SUMATRIPTAN SUCCINATE 100 MG PO TABS: 100.0000 mg | ORAL_TABLET | ORAL | 3 refills | Status: DC | PRN

## 2021-01-02 MED ORDER — NORETHINDRONE ACET-ETHINYL EST 1-20 MG-MCG PO TABS: ORAL_TABLET | ORAL | 3 refills | Status: DC

## 2021-01-02 NOTE — Progress Notes (Signed)
Patient ID: Kristen Dunn, female   DOB: 1975-09-30, 45 y.o.   MRN: [id number] History of Present Illness: Kristen Dunn is a 45 year old white female,married, [id number], in for a well woman gyn exam and pap. PCP will be at [facility] in [place], appt in November   Current Medications, Allergies, Past Medical History, Past Surgical History, Family History and Social History were reviewed in [employer] record.     Review of Systems: Patient denies any  hearing loss, fatigue, blurred vision, shortness of breath, chest pain, abdominal pain, problems with bowel movements, urination, or intercourse. No joint pain or mood swings.  +migraines Uses OCs for menstrual regulation and helps with migraines  Physical Exam:BP 118/80 (BP Location: Left Arm, Patient Position: Sitting, Cuff Size: Normal)   Pulse 82   Ht '5\' 5"'$  (1.651 m)   Wt 186 lb (84.4 kg)   BMI 30.95 kg/m   General:  Well developed, well nourished, no acute distress Skin:  Warm and dry Neck:  Midline trachea, normal thyroid, good ROM, no lymphadenopathy Lungs; Clear to auscultation bilaterally Breast:  No dominant palpable mass, retraction, or nipple discharge Cardiovascular: Regular rate and rhythm Abdomen:  Soft, non tender, no hepatosplenomegaly Pelvic:  External genitalia is normal in appearance, no lesions.  The vagina is normal in appearance. Urethra has no lesions or masses. The cervix is bulbous.Pap with HR HPV genotyping performed.   Uterus is felt to be normal size, shape, and contour.  No adnexal masses or tenderness noted.Bladder is non tender, no masses felt. Rectal: Good sphincter tone, no polyps, or hemorrhoids felt.  Hemoccult negative. Extremities/musculoskeletal:  No swelling or varicosities noted, no clubbing or cyanosis Psych:  No mood changes, alert and cooperative,seems happy AA is 1 Fall risk is low Depression screen [facility] 2/9 01/02/2021 01/02/2020 10/27/2017  Decreased Interest 0 0 0  Down, Depressed,  Hopeless 0 0 0  PHQ - 2 Score 0 0 0  Altered sleeping 1 1 -  Tired, decreased energy 0 0 -  Change in appetite 0 0 -  Feeling bad or failure about yourself  0 0 -  Trouble concentrating 0 0 -  Moving slowly or fidgety/restless 0 0 -  Suicidal thoughts 0 0 -  PHQ-9 Score 1 1 -  Difficult doing work/chores - Not difficult at all -    GAD 7 : Generalized Anxiety Score 01/02/2021 01/02/2020  Nervous, Anxious, on Edge 1 1  Control/stop worrying 1 0  Worry too much - different things 0 1  Trouble relaxing 1 1  Restless 0 0  Easily annoyed or irritable 1 2  Afraid - awful might happen 1 0  Total GAD 7 Score 5 5  Anxiety Difficulty - Not difficult at all      Upstream - 01/02/21 1631       Pregnancy Intention Screening   Does the patient want to become pregnant in the next year? No    Does the patient's partner want to become pregnant in the next year? No    Would the patient like to discuss contraceptive options today? No      Contraception Wrap Up   Current Method Female Sterilization    End Method Female Sterilization    Contraception Counseling Provided No            Examination chaperoned by Levy Pupa LPN   Impression and plan:   1. Encounter for gynecological examination with Papanicolaou smear of cervix Pap sent Physical in 1  year Pap in 3 if normal Mammogram yearly  2. Screening for colorectal cancer Referred to [facility]  3. Encounter for menstrual regulation Continue OCs  Meds ordered this encounter  Medications   norethindrone-ethinyl estradiol (LOESTRIN) 1-20 MG-MCG tablet    Sig: Take 1 daily continuously, do not take placebo tabs    Dispense:  112 tablet    Refill:  3    Order Specific Question:   Supervising Provider    Answer:   Tania Ade H [[id number]]   SUMAtriptan (IMITREX) 100 MG tablet    Sig: Take 1 tablet (100 mg total) by mouth every 2 (two) hours as needed for migraine. May repeat in 2 hours if headache persists or recurs.    Dispense:  30  tablet    Refill:  3    Order Specific Question:   Supervising Provider    Answer:   Elonda Husky, LUTHER H [[id number]]     4. Migraine without aura and without status migrainosus, not intractable Refilled imitrex  5. Encounter for screening fecal occult blood testing

## 2021-01-07 LAB — CYTOLOGY - PAP
Adequacy: ABSENT
Comment: NEGATIVE
Diagnosis: NEGATIVE
High risk HPV: NEGATIVE

## 2021-01-08 ENCOUNTER — Encounter: Payer: Self-pay | Admitting: Internal Medicine

## 2021-02-11 ENCOUNTER — Ambulatory Visit: Payer: BC Managed Care – PPO

## 2021-02-22 ENCOUNTER — Encounter: Payer: Self-pay | Admitting: Registered Nurse

## 2021-02-22 ENCOUNTER — Ambulatory Visit (INDEPENDENT_AMBULATORY_CARE_PROVIDER_SITE_OTHER): Payer: BC Managed Care – PPO | Admitting: Registered Nurse

## 2021-02-22 ENCOUNTER — Other Ambulatory Visit: Payer: Self-pay

## 2021-02-22 VITALS — BP 129/97 | HR 86 | Temp 98.0°F | Resp 18 | Ht 65.0 in | Wt 183.8 lb

## 2021-02-22 DIAGNOSIS — Z130 Encounter for screening for diseases of the blood and blood-forming organs and certain disorders involving the immune mechanism: Secondary | ICD-10-CM

## 2021-02-22 DIAGNOSIS — J4520 Mild intermittent asthma, uncomplicated: Secondary | ICD-10-CM | POA: Diagnosis not present

## 2021-02-22 DIAGNOSIS — Z1159 Encounter for screening for other viral diseases: Secondary | ICD-10-CM | POA: Diagnosis not present

## 2021-02-22 DIAGNOSIS — Z1329 Encounter for screening for other suspected endocrine disorder: Secondary | ICD-10-CM

## 2021-02-22 DIAGNOSIS — Z8669 Personal history of other diseases of the nervous system and sense organs: Secondary | ICD-10-CM

## 2021-02-22 DIAGNOSIS — Z13228 Encounter for screening for other metabolic disorders: Secondary | ICD-10-CM

## 2021-02-22 DIAGNOSIS — Z13220 Encounter for screening for lipoid disorders: Secondary | ICD-10-CM

## 2021-02-22 LAB — COMPREHENSIVE METABOLIC PANEL
ALT: 9 U/L (ref 0–35)
AST: 18 U/L (ref 0–37)
Albumin: 4.3 g/dL (ref 3.5–5.2)
Alkaline Phosphatase: 54 U/L (ref 39–117)
BUN: 12 mg/dL (ref 6–23)
CO2: 28 mEq/L (ref 19–32)
Calcium: 9.7 mg/dL (ref 8.4–10.5)
Chloride: 104 mEq/L (ref 96–112)
Creatinine, Ser: 0.81 mg/dL (ref 0.40–1.20)
GFR: 86.98 mL/min (ref >60.00)
Glucose, Bld: 92 mg/dL (ref 70–99)
Potassium: 4.8 mEq/L (ref 3.5–5.1)
Sodium: 139 mEq/L (ref 135–145)
Total Bilirubin: 1.2 mg/dL (ref 0.2–1.2)
Total Protein: 7.4 g/dL (ref 6.0–8.3)

## 2021-02-22 LAB — CBC WITH DIFFERENTIAL/PLATELET
Basophils Absolute: 0.1 10*3/uL (ref 0.0–0.1)
Basophils Relative: 1.3 % (ref 0.0–3.0)
Eosinophils Absolute: 0.1 10*3/uL (ref 0.0–0.7)
Eosinophils Relative: 2.1 % (ref 0.0–5.0)
HCT: 41.8 % (ref 36.0–46.0)
Hemoglobin: 13.9 g/dL (ref 12.0–15.0)
Lymphocytes Relative: 27.1 % (ref 12.0–46.0)
Lymphs Abs: 1.3 10*3/uL (ref 0.7–4.0)
MCHC: 33.1 g/dL (ref 30.0–36.0)
MCV: 93.1 fl (ref 78.0–100.0)
Monocytes Absolute: 0.4 10*3/uL (ref 0.1–1.0)
Monocytes Relative: 7.6 % (ref 3.0–12.0)
Neutro Abs: 3 10*3/uL (ref 1.4–7.7)
Neutrophils Relative %: 61.9 % (ref 43.0–77.0)
Platelets: 371 10*3/uL (ref 150.0–400.0)
RBC: 4.5 Mil/uL (ref 3.87–5.11)
RDW: 13.8 % (ref 11.5–15.5)
WBC: 4.8 10*3/uL (ref 4.0–10.5)

## 2021-02-22 LAB — LIPID PANEL
Cholesterol: 211 mg/dL — ABNORMAL HIGH (ref 0–200)
HDL: 81.2 mg/dL (ref >39.00)
LDL Cholesterol: 113 mg/dL — ABNORMAL HIGH (ref 0–99)
NonHDL: 129.43
Total CHOL/HDL Ratio: 3
Triglycerides: 83 mg/dL (ref 0.0–149.0)
VLDL: 16.6 mg/dL (ref 0.0–40.0)

## 2021-02-22 LAB — HEMOGLOBIN A1C: Hgb A1c MFr Bld: 5.7 % (ref 4.6–6.5)

## 2021-02-22 LAB — TSH: TSH: 1.01 u[IU]/mL (ref 0.35–5.50)

## 2021-02-22 MED ORDER — ALBUTEROL SULFATE HFA 108 (90 BASE) MCG/ACT IN AERS: 1.0000 | INHALATION_SPRAY | Freq: Four times a day (QID) | RESPIRATORY_TRACT | 0 refills | Status: DC | PRN

## 2021-02-22 MED ORDER — RIZATRIPTAN BENZOATE 10 MG PO TABS
10.0000 mg | ORAL_TABLET | ORAL | 0 refills | Status: DC | PRN
Start: 2021-02-22 — End: 2021-09-23

## 2021-02-22 NOTE — Patient Instructions (Addendum)
Ms. Yazhini Mcaulay to meet you!  Try the rizatriptan - use the same as sumatriptan Can consider neuro referral  Labs will be back this afternoon. I'll call with any concerns   See you in 2023 for a physical and lab work  Thank you!  Rich     If you have lab work done today you will be contacted with your lab results within the next 2 weeks.  If you have not heard from [place] then please contact us. The fastest way to get your results is to register for My Chart.   IF you received an x-ray today, you will receive an invoice from [facility]. Please contact [facility] at [phone] with questions or concerns regarding your invoice.   IF you received labwork today, you will receive an invoice from [facility]. Please contact [facility] at [phone] with questions or concerns regarding your invoice.   Our billing staff will not be able to assist you with questions regarding bills from these companies.  You will be contacted with the lab results as soon as they are available. The fastest way to get your results is to activate your My Chart account. Instructions are located on the last page of this paperwork. If you have not heard from [place] regarding the results in 2 weeks, please contact this office.

## 2021-02-22 NOTE — Progress Notes (Signed)
New Patient Office Visit  Subjective:  Patient ID: Kristen Dunn, female    DOB: 1976/03/03  Age: 45 y.o. MRN: [id number]  CC:  Chief Complaint  Patient presents with   New Patient (Initial Visit)    Patient states she is here to establish care and discuss migraine for about 3 months    HPI Kristen Dunn presents for new patient  Histories reviewed and updated with patient.   Migraines On COC through OBGYN, sumatriptan 100mg  po qd prn Had been on topamax for many years - didn't help much. Interested in other options if available.  Past Medical History:  Diagnosis Date   AMA (advanced maternal age) multigravida 35+    Anemia    Asthma    Degenerative lumbar disc    Family history of adverse reaction to anesthesia    mother has nausea   Gestational diabetes    glyburide   Hx of migraines 10/26/2012   Other and unspecified ovarian cyst 03/30/2013   PONV (postoperative nausea and vomiting)     Past Surgical History:  Procedure Laterality Date   CESAREAN SECTION  01/13/2012   Procedure: CESAREAN SECTION;  Surgeon: Mora Bellman, MD;  Location: [facility];  Service: Obstetrics;  Laterality: N/A;   CESAREAN SECTION WITH BILATERAL TUBAL LIGATION Bilateral 07/30/2015   Procedure: REPEAT CESAREAN SECTION WITH BILATERAL TUBAL LIGATION, WIDE EXCISION OF CICHEIX;  Surgeon: Jonnie Kind, MD;  Location: [place] [facility];  Service: Obstetrics;  Laterality: Bilateral;   Gastric Bypass 2008     HERNIA REPAIR     INCISIONAL HERNIA REPAIR  03/31/2012   Procedure: LAPAROSCOPIC INCISIONAL HERNIA;  Surgeon: Edward Jolly, MD;  Location: [facility];  Service: General;  Laterality: N/A;  LAPAROSCOPY REPAIR INTERNAL HERNIA  petersons defect   LAPAROSCOPIC GASTRIC BANDING WITH HIATAL HERNIA REPAIR N/A 03/14/2013   Procedure: DIAGNOSTIC LAPAROSCOPY  WITH  INTERNAL HERNIA  REDUCTION WITH CLOSURE;  Surgeon: Shann Medal, MD;  Location: [facility];  Service: General;  Laterality: N/A;   TONSILLECTOMY       Family History  Problem Relation Age of Onset   COPD Mother    Asthma Mother    Skin cancer Mother    Hypertension Father    Aneurysm Father    Asthma Sister    Thyroid disease Maternal Grandmother    Stroke Maternal Grandmother    Cancer Maternal Aunt        breast   Breast cancer Maternal Aunt    Von Willebrand disease Cousin     Social History   Socioeconomic History   Marital status: Married    Spouse name: Not on file   Number of children: 2   Years of education: Not on file   Highest education level: Not on file  Occupational History   Occupation: [employer]  Tobacco Use   Smoking status: Never   Smokeless tobacco: Never  Vaping Use   Vaping Use: Never used  Substance and Sexual Activity   Alcohol use: Yes    Comment: rarely   Drug use: No   Sexual activity: Yes    Birth control/protection: Surgical    Comment: tubal  Other Topics Concern   Not on file  Social History Narrative   2022 - 8yo and 45yo   Social Determinants of Health   Financial Resource Strain: Low Risk    Difficulty of Paying Living Expenses: Not hard at all  Food Insecurity: No Food Insecurity   Worried About Running  Out of Food in the Last Year: Never true   Ran Out of Food in the Last Year: Never true  Transportation Needs: No Transportation Needs   Lack of Transportation (Medical): No   Lack of Transportation (Non-Medical): No  Physical Activity: Insufficiently Active   Days of Exercise per Week: 2 days   Minutes of Exercise per Session: 30 min  Stress: Stress Concern Present   Feeling of Stress : To some extent  Social Connections: Socially Integrated   Frequency of Communication with Friends and Family: Three times a week   Frequency of Social Gatherings with Friends and Family: Once a week   Attends Religious Services: More than 4 times per year   Active Member of [employer] or Organizations: Yes   Attends Archivist Meetings: 1 to 4 times per year   Marital Status:  Married  Human resources officer Violence: Not At Risk   Fear of Current or Ex-Partner: No   Emotionally Abused: No   Physically Abused: No   Sexually Abused: No    ROS Review of Systems  Constitutional: Negative.   HENT: Negative.    Eyes: Negative.   Respiratory: Negative.    Cardiovascular: Negative.   Gastrointestinal: Negative.   Genitourinary: Negative.   Musculoskeletal: Negative.   Skin: Negative.   Neurological: Negative.   Psychiatric/Behavioral: Negative.    All other systems reviewed and are negative.  Objective:   Today's Vitals: BP (!) 129/97   Pulse 86   Temp 98 F (36.7 C)   Resp 18   Ht 5\' 5"  (1.651 m)   Wt 183 lb 12.8 oz (83.4 kg)   SpO2 100%   BMI 30.59 kg/m   Physical Exam Vitals and nursing note reviewed.  Constitutional:      General: She is not in acute distress.    Appearance: Normal appearance. She is not ill-appearing, toxic-appearing or diaphoretic.  Cardiovascular:     Rate and Rhythm: Normal rate and regular rhythm.     Pulses: Normal pulses.     Heart sounds: Normal heart sounds. No murmur heard.   No friction rub. No gallop.  Pulmonary:     Effort: Pulmonary effort is normal. No respiratory distress.     Breath sounds: Normal breath sounds. No stridor. No wheezing, rhonchi or rales.  Chest:     Chest wall: No tenderness.  Skin:    General: Skin is warm and dry.     Capillary Refill: Capillary refill takes less than 2 seconds.  Neurological:     General: No focal deficit present.     Mental Status: She is alert and oriented to person, place, and time. Mental status is at baseline.  Psychiatric:        Mood and Affect: Mood normal.        Behavior: Behavior normal.        Thought Content: Thought content normal.        Judgment: Judgment normal.    Assessment & Plan:   Problem List Items Addressed This Visit       Other   Hx of migraines   Other Visit Diagnoses     Screening for endocrine, metabolic and immunity disorder     -  Primary   Encounter for screening for other viral diseases       Screening, lipid       Mild intermittent asthma without complication           Outpatient Encounter Medications as of 02/22/2021  Medication Sig   acetaminophen (TYLENOL) 325 MG tablet Take 650 mg by mouth every 6 (six) hours as needed.   Aspirin-Acetaminophen-Caffeine (EXCEDRIN MIGRAINE PO) Take by mouth.   gabapentin (NEURONTIN) 300 MG capsule Take 300 mg by mouth daily.   IRON PO Take by mouth daily.   Multiple Vitamin (MULTIVITAMIN) tablet Take 1 tablet by mouth daily.   norethindrone-ethinyl estradiol (LOESTRIN) 1-20 MG-MCG tablet Take 1 daily continuously, do not take placebo tabs   SUMAtriptan (IMITREX) 100 MG tablet Take 1 tablet (100 mg total) by mouth every 2 (two) hours as needed for migraine. May repeat in 2 hours if headache persists or recurs.   [DISCONTINUED] ALBUTEROL IN Inhale into the lungs as needed.   No facility-administered encounter medications on file as of 02/22/2021.    Follow-up: Return in about 6 months (around 08/22/2021) for CPE and labs - at some point in 2023.   PLAN Rizatriptan 10mg  po qd, repeat in 2 hours on day one of migraine if needed, max 2 doses daily for migraine. Reviewed r/b/se with patient who voices understanding. Labs collected. Will follow up with the patient as warranted. Return in 2023 for CPE and labs Patient encouraged to call clinic with any questions, comments, or concerns.  Maximiano Coss, NP

## 2021-02-25 LAB — HEPATITIS C ANTIBODY
Hepatitis C Ab: NONREACTIVE
SIGNAL TO CUT-OFF: 0.06 (ref <1.00)

## 2021-02-26 ENCOUNTER — Ambulatory Visit (INDEPENDENT_AMBULATORY_CARE_PROVIDER_SITE_OTHER): Payer: Self-pay | Admitting: *Deleted

## 2021-02-26 ENCOUNTER — Other Ambulatory Visit: Payer: Self-pay

## 2021-02-26 ENCOUNTER — Encounter: Payer: Self-pay | Admitting: *Deleted

## 2021-02-26 VITALS — Ht 65.0 in | Wt 183.0 lb

## 2021-02-26 DIAGNOSIS — Z1211 Encounter for screening for malignant neoplasm of colon: Secondary | ICD-10-CM

## 2021-02-26 MED ORDER — NA SULFATE-K SULFATE-MG SULF 17.5-3.13-1.6 GM/177ML PO SOLN: 1.0000 | Freq: Once | ORAL | 0 refills | Status: AC

## 2021-02-26 NOTE — Progress Notes (Addendum)
Gastroenterology Pre-Procedure Review  Request Date: 02/26/2021 Requesting Physician: Derrek Monaco, NP @ [facility], no previous TCS  PATIENT REVIEW QUESTIONS: The patient responded to the following health history questions as indicated:    1. Diabetes Melitis: no 2. Joint replacements in the past 12 months: no 3. Major health problems in the past 3 months: no 4. Has an artificial valve or MVP: no 5. Has a defibrillator: no 6. Has been advised in past to take antibiotics in advance of a procedure like teeth cleaning: no 7. Family history of colon cancer: no 8. Alcohol Use: yes, 1 drink a week 9. Illicit drug Use: no 10. History of sleep apnea: no  11. History of coronary artery or other vascular stents placed within the last 12 months: no 12. History of any prior anesthesia complications: yes, nausea and passing out-2017 c-section 13. Body mass index is 30.45 kg/m.    MEDICATIONS & ALLERGIES:    Patient reports the following regarding taking any blood thinners:   Plavix? no Aspirin? no Coumadin? no Brilinta? no Xarelto? no Eliquis? no Pradaxa? no Savaysa? no Effient? no  Patient confirms/reports the following medications:  Current Outpatient Medications  Medication Sig Dispense Refill   acetaminophen (TYLENOL) 325 MG tablet Take 650 mg by mouth as needed.     albuterol (VENTOLIN HFA) 108 (90 Base) MCG/ACT inhaler Inhale 1-2 puffs into the lungs every 6 (six) hours as needed for wheezing or shortness of breath. 8 g 0   Aspirin-Acetaminophen-Caffeine (EXCEDRIN MIGRAINE PO) Take by mouth as needed.     gabapentin (NEURONTIN) 300 MG capsule Take 300 mg by mouth daily.     Multiple Vitamin (MULTIVITAMIN) tablet Take 1 tablet by mouth daily.     norethindrone-ethinyl estradiol (LOESTRIN) 1-20 MG-MCG tablet Take 1 daily continuously, do not take placebo tabs 112 tablet 3   rizatriptan (MAXALT) 10 MG tablet Take 1 tablet (10 mg total) by mouth as needed for migraine. May  repeat in 2 hours if needed 10 tablet 0   SUMAtriptan (IMITREX) 100 MG tablet Take 1 tablet (100 mg total) by mouth every 2 (two) hours as needed for migraine. May repeat in 2 hours if headache persists or recurs. 30 tablet 3   No current facility-administered medications for this visit.    Patient confirms/reports the following allergies:  Allergies  Allergen Reactions   Penicillins Anaphylaxis    Reaction occurred as a very small child; anaphylaxis likely but not completely certain Has patient had a PCN reaction causing immediate rash, facial/tongue/throat swelling, SOB or lightheadedness with hypotension: Yes Has patient had a PCN reaction causing severe rash involving mucus membranes or skin necrosis: No Has patient had a PCN reaction that required hospitalization No Has patient had a PCN reaction occurring within the last 10 years: No If all of the above answers are "NO", then may procee   Aspirin     Intolerance because of gastric bypass surgery Asthma flares up   Alupent [Metaproterenol] Palpitations and Other (See Comments)    hyperadrenergic tremor   Lactose Intolerance (Gi) Diarrhea   Latex Itching and Dermatitis    No orders of the defined types were placed in this encounter.   AUTHORIZATION INFORMATION Primary Insurance: [place],  [place] #: [id number],  Group #: [id number] Pre-Cert / Josem Kaufmann required: No, not required per Tilden Fossa / Auth #: Ref#: [id number]  SCHEDULE INFORMATION: Procedure has been scheduled as follows:  Date: 03/22/2021, Time: 9:30  Location: [facility] with Dr. Abbey Chatters  This Gastroenterology  Pre-Precedure Review Form is being routed to the following provider(s): Aliene Altes, PA-C

## 2021-02-27 ENCOUNTER — Other Ambulatory Visit: Payer: Self-pay | Admitting: *Deleted

## 2021-02-27 DIAGNOSIS — Z1211 Encounter for screening for malignant neoplasm of colon: Secondary | ICD-10-CM

## 2021-02-27 NOTE — Progress Notes (Signed)
OK to schedule. ASA II.   Needs urine pregnancy test.

## 2021-02-28 ENCOUNTER — Other Ambulatory Visit: Payer: Self-pay | Admitting: *Deleted

## 2021-02-28 NOTE — Progress Notes (Signed)
Called and spoke to [place] at [place].  He informed me that colonoscopies are covered at age 45.  REF#:  [id number]

## 2021-03-20 ENCOUNTER — Other Ambulatory Visit (HOSPITAL_COMMUNITY)
Admission: RE | Admit: 2021-03-20 | Discharge: 2021-03-20 | Disposition: A | Discharge status: Home / Self Care | Payer: BC Managed Care – PPO | Source: Ambulatory Visit | Attending: Internal Medicine | Admitting: Internal Medicine

## 2021-03-20 DIAGNOSIS — J45909 Unspecified asthma, uncomplicated: Secondary | ICD-10-CM | POA: Diagnosis not present

## 2021-03-20 DIAGNOSIS — K648 Other hemorrhoids: Secondary | ICD-10-CM | POA: Diagnosis not present

## 2021-03-20 DIAGNOSIS — Z1211 Encounter for screening for malignant neoplasm of colon: Secondary | ICD-10-CM

## 2021-03-20 DIAGNOSIS — K6389 Other specified diseases of intestine: Secondary | ICD-10-CM | POA: Diagnosis not present

## 2021-03-20 DIAGNOSIS — K5730 Diverticulosis of large intestine without perforation or abscess without bleeding: Secondary | ICD-10-CM | POA: Diagnosis not present

## 2021-03-20 DIAGNOSIS — D123 Benign neoplasm of transverse colon: Secondary | ICD-10-CM | POA: Diagnosis not present

## 2021-03-20 LAB — PREGNANCY, URINE: Preg Test, Ur: NEGATIVE

## 2021-03-22 ENCOUNTER — Encounter (HOSPITAL_COMMUNITY)
Admission: RE | Disposition: A | Discharge status: Home / Self Care | Payer: Self-pay | Source: Home / Self Care | Attending: Internal Medicine

## 2021-03-22 ENCOUNTER — Ambulatory Visit (HOSPITAL_COMMUNITY): Payer: BC Managed Care – PPO | Admitting: Anesthesiology

## 2021-03-22 ENCOUNTER — Ambulatory Visit (HOSPITAL_COMMUNITY)
Admission: RE | Admit: 2021-03-22 | Discharge: 2021-03-22 | Disposition: A | Discharge status: Home / Self Care | Payer: BC Managed Care – PPO | Attending: Internal Medicine | Admitting: Internal Medicine

## 2021-03-22 ENCOUNTER — Encounter (HOSPITAL_COMMUNITY): Payer: Self-pay

## 2021-03-22 ENCOUNTER — Other Ambulatory Visit: Payer: Self-pay

## 2021-03-22 DIAGNOSIS — Z1211 Encounter for screening for malignant neoplasm of colon: Secondary | ICD-10-CM | POA: Diagnosis not present

## 2021-03-22 DIAGNOSIS — K635 Polyp of colon: Secondary | ICD-10-CM | POA: Diagnosis not present

## 2021-03-22 DIAGNOSIS — K6389 Other specified diseases of intestine: Secondary | ICD-10-CM | POA: Diagnosis not present

## 2021-03-22 DIAGNOSIS — K648 Other hemorrhoids: Secondary | ICD-10-CM | POA: Diagnosis not present

## 2021-03-22 DIAGNOSIS — K5730 Diverticulosis of large intestine without perforation or abscess without bleeding: Secondary | ICD-10-CM | POA: Insufficient documentation

## 2021-03-22 DIAGNOSIS — J45909 Unspecified asthma, uncomplicated: Secondary | ICD-10-CM | POA: Insufficient documentation

## 2021-03-22 DIAGNOSIS — D123 Benign neoplasm of transverse colon: Secondary | ICD-10-CM | POA: Insufficient documentation

## 2021-03-22 DIAGNOSIS — Z139 Encounter for screening, unspecified: Secondary | ICD-10-CM | POA: Diagnosis not present

## 2021-03-22 HISTORY — PX: COLONOSCOPY WITH PROPOFOL: SHX5780

## 2021-03-22 HISTORY — PX: POLYPECTOMY: SHX5525

## 2021-03-22 SURGERY — COLONOSCOPY WITH PROPOFOL
Anesthesia: General

## 2021-03-22 MED ORDER — LACTATED RINGERS IV SOLN
INTRAVENOUS | Status: DC
  Administered 2021-03-22: 1000 mL via INTRAVENOUS

## 2021-03-22 MED ORDER — PROPOFOL 10 MG/ML IV BOLUS
INTRAVENOUS | Status: DC | PRN
  Administered 2021-03-22: 50 mg via INTRAVENOUS
  Administered 2021-03-22: 30 mg via INTRAVENOUS
  Administered 2021-03-22: 50 mg via INTRAVENOUS
  Administered 2021-03-22: 100 mg via INTRAVENOUS

## 2021-03-22 NOTE — Transfer of Care (Signed)
Immediate Anesthesia Transfer of Care Note  Patient: Kristen Dunn  Procedure(s) Performed: COLONOSCOPY WITH PROPOFOL POLYPECTOMY  Patient Location: Endoscopy Unit  Anesthesia Type:General  Level of Consciousness: awake, alert  and oriented  Airway & Oxygen Therapy: Patient Spontanous Breathing  Post-op Assessment: Report given to RN and Post -op Vital signs reviewed and stable  Post vital signs: Reviewed and stable  Last Vitals:  Vitals Value Taken Time  BP    Temp    Pulse    Resp    SpO2      Last Pain:  Vitals:   03/22/21 0945  TempSrc:   PainSc: 5       Patients Stated Pain Goal: 4 (67/20/94 7096)  Complications: No notable events documented.

## 2021-03-22 NOTE — H&P (Signed)
Primary Care Physician:  Maximiano Coss, NP Primary Gastroenterologist:  Dr. Abbey Chatters  Pre-Procedure History & Physical: HPI:  Kristen Dunn is a 45 y.o. female is here for a colonoscopy for colon cancer screening purposes.  Patient denies any family history of colorectal cancer.  No melena or hematochezia.  No abdominal pain or unintentional weight loss.  No change in bowel habits.  Overall feels well from a GI standpoint.  Past Medical History:  Diagnosis Date   AMA (advanced maternal age) multigravida 35+    Anemia    Asthma    Degenerative lumbar disc    Family history of adverse reaction to anesthesia    mother has nausea   Gestational diabetes    glyburide   Hx of migraines 10/26/2012   Other and unspecified ovarian cyst 03/30/2013   PONV (postoperative nausea and vomiting)     Past Surgical History:  Procedure Laterality Date   CESAREAN SECTION  01/13/2012   Procedure: CESAREAN SECTION;  Surgeon: Mora Bellman, MD;  Location: [facility];  Service: Obstetrics;  Laterality: N/A;   CESAREAN SECTION WITH BILATERAL TUBAL LIGATION Bilateral 07/30/2015   Procedure: REPEAT CESAREAN SECTION WITH BILATERAL TUBAL LIGATION, WIDE EXCISION OF CICHEIX;  Surgeon: Jonnie Kind, MD;  Location: [facility];  Service: Obstetrics;  Laterality: Bilateral;   Gastric Bypass 2008     HERNIA REPAIR     INCISIONAL HERNIA REPAIR  03/31/2012   Procedure: LAPAROSCOPIC INCISIONAL HERNIA;  Surgeon: Edward Jolly, MD;  Location: [facility];  Service: General;  Laterality: N/A;  LAPAROSCOPY REPAIR INTERNAL HERNIA  petersons defect   LAPAROSCOPIC GASTRIC BANDING WITH HIATAL HERNIA REPAIR N/A 03/14/2013   Procedure: DIAGNOSTIC LAPAROSCOPY  WITH  INTERNAL HERNIA  REDUCTION WITH CLOSURE;  Surgeon: Shann Medal, MD;  Location: [facility];  Service: General;  Laterality: N/A;   TONSILLECTOMY      Prior to Admission medications   Medication Sig Start Date End Date Taking? Authorizing Provider  acetaminophen (TYLENOL)  325 MG tablet Take 650 mg by mouth as needed.   Yes [provider]  acetaminophen (TYLENOL) 500 MG tablet Take 1,000 mg by mouth every 6 (six) hours as needed (pain.).   Yes [provider]  BLISOVI FE 1/20 1-20 MG-MCG tablet Take 1 tablet by mouth in the morning. 01/14/21  Yes [provider]  gabapentin (NEURONTIN) 100 MG capsule Take 100 mg by mouth 3 (three) times daily as needed (pain.).   Yes [provider]  Multiple Vitamin (MULTIVITAMIN WITH MINERALS) TABS tablet Take 1 tablet by mouth in the morning.   Yes [provider]  Na Sulfate-K Sulfate-Mg Sulf 17.5-3.13-1.6 GM/177ML SOLN Take 354 mLs by mouth as directed. 02/26/21  Yes [provider]  rizatriptan (MAXALT) 10 MG tablet Take 1 tablet (10 mg total) by mouth as needed for migraine. May repeat in 2 hours if needed 02/22/21  Yes Maximiano Coss, NP  albuterol (VENTOLIN HFA) 108 (90 Base) MCG/ACT inhaler Inhale 1-2 puffs into the lungs every 6 (six) hours as needed for wheezing or shortness of breath. 02/22/21   Maximiano Coss, NP  norethindrone-ethinyl estradiol (LOESTRIN) 1-20 MG-MCG tablet Take 1 daily continuously, do not take placebo tabs Patient not taking: Reported on 03/18/2021 01/02/21   Estill Dooms, NP  SUMAtriptan (IMITREX) 100 MG tablet Take 1 tablet (100 mg total) by mouth every 2 (two) hours as needed for migraine. May repeat in 2 hours if headache persists or recurs. Patient not taking: Reported on  03/18/2021 01/02/21   Estill Dooms, NP    Allergies as of 02/27/2021 - Review Complete 02/26/2021  Allergen Reaction Noted   Penicillins Anaphylaxis 11/20/2011   Aspirin  03/24/2012   Alupent [metaproterenol] Palpitations and Other (See Comments) 11/20/2011   Lactose intolerance (gi) Diarrhea 11/20/2011   Latex Itching and Dermatitis 01/09/2012    Family History  Problem Relation Age of Onset   COPD Mother    Asthma Mother    Skin cancer Mother     Hypertension Father    Aneurysm Father    Asthma Sister    Thyroid disease Maternal Grandmother    Stroke Maternal Grandmother    Cancer Maternal Aunt        breast   Breast cancer Maternal Aunt    Von Willebrand disease Cousin     Social History   Socioeconomic History   Marital status: Married    Spouse name: Not on file   Number of children: 2   Years of education: Not on file   Highest education level: Not on file  Occupational History   Occupation: [employer]  Tobacco Use   Smoking status: Never   Smokeless tobacco: Never  Vaping Use   Vaping Use: Never used  Substance and Sexual Activity   Alcohol use: Yes    Comment: rarely   Drug use: No   Sexual activity: Yes    Birth control/protection: Surgical    Comment: tubal  Other Topics Concern   Not on file  Social History Narrative   2022 - 8yo and 45yo   Social Determinants of Health   Financial Resource Strain: Low Risk    Difficulty of Paying Living Expenses: Not hard at all  Food Insecurity: No Food Insecurity   Worried About Charity fundraiser in the Last Year: Never true   Ran Out of Food in the Last Year: Never true  Transportation Needs: No Transportation Needs   Lack of Transportation (Medical): No   Lack of Transportation (Non-Medical): No  Physical Activity: Insufficiently Active   Days of Exercise per Week: 2 days   Minutes of Exercise per Session: 30 min  Stress: Stress Concern Present   Feeling of Stress : To some extent  Social Connections: Socially Integrated   Frequency of Communication with Friends and Family: Three times a week   Frequency of Social Gatherings with Friends and Family: Once a week   Attends Religious Services: More than 4 times per year   Active Member of Clubs or Organizations: Yes   Attends Archivist Meetings: 1 to 4 times per year   Marital Status: Married  Human resources officer Violence: Not At Risk   Fear of Current or Ex-Partner: No   Emotionally Abused: No    Physically Abused: No   Sexually Abused: No    Review of Systems: See HPI, otherwise negative ROS  Physical Exam: Vital signs in last 24 hours: Temp:  [98 F (36.7 C)] 98 F (36.7 C) (12/02 0834) Pulse Rate:  [72] 72 (12/02 0834) Resp:  [17] 17 (12/02 0834) BP: (121)/(82) 121/82 (12/02 0834) SpO2:  [100 %] 100 % (12/02 0834) Weight:  [81.6 kg] 81.6 kg (12/02 0834)   General:   Alert,  Well-developed, well-nourished, pleasant and cooperative in NAD Head:  Normocephalic and atraumatic. Eyes:  Sclera clear, no icterus.   Conjunctiva pink. Ears:  Normal auditory acuity. Nose:  No deformity, discharge,  or lesions. Mouth:  No deformity or lesions, dentition normal. Neck:  Supple; no masses or thyromegaly. Lungs:  Clear throughout to auscultation.   No wheezes, crackles, or rhonchi. No acute distress. Heart:  Regular rate and rhythm; no murmurs, clicks, rubs,  or gallops. Abdomen:  Soft, nontender and nondistended. No masses, hepatosplenomegaly or hernias noted. Normal bowel sounds, without guarding, and without rebound.   Msk:  Symmetrical without gross deformities. Normal posture. Extremities:  Without clubbing or edema. Neurologic:  Alert and  oriented x4;  grossly normal neurologically. Skin:  Intact without significant lesions or rashes. Cervical Nodes:  No significant cervical adenopathy. Psych:  Alert and cooperative. Normal mood and affect.  Impression/Plan: Kristen Dunn is here for a colonoscopy to be performed for colon cancer screening purposes.  The risks of the procedure including infection, bleed, or perforation as well as benefits, limitations, alternatives and imponderables have been reviewed with the patient. Questions have been answered. All parties agreeable.

## 2021-03-22 NOTE — Op Note (Signed)
[facility] Patient Name: Kristen Dunn Procedure Date: 03/22/2021 9:35 AM MRN: [id number] Date of Birth: 12/24/1975 Attending MD: Elon Alas. Edgar Frisk CSN: [id number] Age: 45 Admit Type: Outpatient Procedure:                Colonoscopy Indications:              Screening for colorectal malignant neoplasm Providers:                Elon Alas. Abbey Chatters, DO, Jessica Boudreaux, Randa Spike, Technician Referring MD:              Medicines:                See the Anesthesia note for documentation of the                            administered medications Complications:            No immediate complications. Estimated Blood Loss:     Estimated blood loss was minimal. Procedure:                Pre-Anesthesia Assessment:                           - The anesthesia plan was to use monitored                            anesthesia care (MAC).                           After obtaining informed consent, the colonoscope                            was passed under direct vision. Throughout the                            procedure, the patient's blood pressure, pulse, and                            oxygen saturations were monitored continuously. The                            [id number]-HQ190L ([id number]) scope was introduced through                            the anus and advanced to the the cecum, identified                            by appendiceal orifice and ileocecal valve. The                            colonoscopy was performed without difficulty. The                            patient tolerated the procedure well. The quality  of the bowel preparation was evaluated using the                            BBPS [facility] Bowel Preparation Scale) with scores                            of: Right Colon = 3, Transverse Colon = 3 and Left                            Colon = 3 (entire mucosa seen well with no residual                            staining,  small fragments of stool or opaque                            liquid). The total BBPS score equals 9. Scope In: 9:49:21 AM Scope Out: 10:07:32 AM Scope Withdrawal Time: 0 hours 10 minutes 10 seconds  Total Procedure Duration: 0 hours 18 minutes 11 seconds  Findings:      The perianal and digital rectal examinations were normal.      Non-bleeding internal hemorrhoids were found during endoscopy.      Multiple small-mouthed diverticula were found in the sigmoid colon.      A 2 mm polyp was found in the transverse colon. The polyp was sessile.       The polyp was removed with a cold biopsy forceps. Resection and       retrieval were complete.      A segmental area of melanosis was found in the descending colon and in       the transverse colon.      The exam was otherwise without abnormality. Impression:               - Non-bleeding internal hemorrhoids.                           - Diverticulosis in the sigmoid colon.                           - One 2 mm polyp in the transverse colon, removed                            with a cold biopsy forceps. Resected and retrieved.                           - Melanosis in the colon.                           - The examination was otherwise normal. Moderate Sedation:      Per Anesthesia Care Recommendation:           - Patient has a contact number available for                            emergencies. The signs and symptoms of potential  delayed complications were discussed with the                            patient. Return to normal activities tomorrow.                            Written discharge instructions were provided to the                            patient.                           - Resume previous diet.                           - Continue present medications.                           - Await pathology results.                           - Repeat colonoscopy in 5-10 years for surveillance.                            - Return to GI clinic PRN. Procedure Code(s):        --- Professional ---                           [phone], Colonoscopy, flexible; with biopsy, single                            or multiple Diagnosis Code(s):        --- Professional ---                           Z12.11, Encounter for screening for malignant                            neoplasm of colon                           K64.8, Other hemorrhoids                           K63.5, Polyp of colon                           K57.30, Diverticulosis of large intestine without                            perforation or abscess without bleeding CPT copyright 2019 American Medical Association. All rights reserved. The codes documented in this report are preliminary and upon coder review may  be revised to meet current compliance requirements. Elon Alas. Abbey Chatters, DO [place] Abbey Chatters, DO 03/22/2021 10:11:14 AM This report has been signed electronically. Number of Addenda: 0

## 2021-03-22 NOTE — Anesthesia Preprocedure Evaluation (Signed)
Anesthesia Evaluation  Patient identified by MRN, date of birth, ID band Patient awake    Reviewed: Allergy & Precautions, H&P , NPO status , Patient's Chart, lab work & pertinent test results, reviewed documented beta blocker date and time   History of Anesthesia Complications (+) PONV and history of anesthetic complications  Airway Mallampati: II  TM Distance: >3 FB Neck ROM: full    Dental no notable dental hx.    Pulmonary neg pulmonary ROS, asthma ,    Pulmonary exam normal breath sounds clear to auscultation       Cardiovascular Exercise Tolerance: Good negative cardio ROS   Rhythm:regular Rate:Normal     Neuro/Psych  Headaches, negative neurological ROS  negative psych ROS   GI/Hepatic negative GI ROS, Neg liver ROS,   Endo/Other  negative endocrine ROSdiabetes, Type 2  Renal/GU negative Renal ROS  negative genitourinary   Musculoskeletal  (+) Arthritis ,   Abdominal   Peds  Hematology negative hematology ROS (+) Blood dyscrasia, anemia ,   Anesthesia Other Findings   Reproductive/Obstetrics negative OB ROS                             Anesthesia Physical Anesthesia Plan  ASA: 2  Anesthesia Plan: General   Post-op Pain Management:    Induction:   PONV Risk Score and Plan: Propofol infusion  Airway Management Planned:   Additional Equipment:   Intra-op Plan:   Post-operative Plan:   Informed Consent: I have reviewed the patients History and Physical, chart, labs and discussed the procedure including the risks, benefits and alternatives for the proposed anesthesia with the patient or authorized representative who has indicated his/her understanding and acceptance.     Dental Advisory Given  Plan Discussed with: CRNA  Anesthesia Plan Comments:         Anesthesia Quick Evaluation

## 2021-03-22 NOTE — Discharge Instructions (Addendum)
  Colonoscopy Discharge Instructions  Read the instructions outlined below and refer to this sheet in the next few weeks. These discharge instructions provide you with general information on caring for yourself after you leave the hospital. Your doctor may also give you specific instructions. While your treatment has been planned according to the most current medical practices available, unavoidable complications occasionally occur.   ACTIVITY You may resume your regular activity, but move at a slower pace for the next 24 hours.  Take frequent rest periods for the next 24 hours.  Walking will help get rid of the air and reduce the bloated feeling in your belly (abdomen).  No driving for 24 hours (because of the medicine (anesthesia) used during the test).   Do not sign any important legal documents or operate any machinery for 24 hours (because of the anesthesia used during the test).  NUTRITION Drink plenty of fluids.  You may resume your normal diet as instructed by your doctor.  Begin with a light meal and progress to your normal diet. Heavy or fried foods are harder to digest and may make you feel sick to your stomach (nauseated).  Avoid alcoholic beverages for 24 hours or as instructed.  MEDICATIONS You may resume your normal medications unless your doctor tells you otherwise.  WHAT YOU CAN EXPECT TODAY Some feelings of bloating in the abdomen.  Passage of more gas than usual.  Spotting of blood in your stool or on the toilet paper.  IF YOU HAD POLYPS REMOVED DURING THE COLONOSCOPY: No aspirin products for 7 days or as instructed.  No alcohol for 7 days or as instructed.  Eat a soft diet for the next 24 hours.  FINDING OUT THE RESULTS OF YOUR TEST Not all test results are available during your visit. If your test results are not back during the visit, make an appointment with your caregiver to find out the results. Do not assume everything is normal if you have not heard from your  caregiver or the medical facility. It is important for you to follow up on all of your test results.  SEEK IMMEDIATE MEDICAL ATTENTION IF: You have more than a spotting of blood in your stool.  Your belly is swollen (abdominal distention).  You are nauseated or vomiting.  You have a temperature over 101.  You have abdominal pain or discomfort that is severe or gets worse throughout the day.   Your colonoscopy revealed 1 polyp(s) which I removed successfully. Await pathology results, my office will contact you. I recommend repeating colonoscopy in 5-10 years for surveillance purposes depending on pathology. You also have diverticulosis and internal hemorrhoids. I would recommend increasing fiber in your diet or adding OTC Benefiber/Metamucil. Be sure to drink at least 4 to 6 glasses of water daily. Follow-up with GI as needed.    I hope you have a great rest of your week!  Elon Alas. Abbey Chatters, D.O. Gastroenterology and Hepatology [facility]

## 2021-03-22 NOTE — Anesthesia Postprocedure Evaluation (Signed)
Anesthesia Post Note  Patient: Kristen Dunn  Procedure(s) Performed: COLONOSCOPY WITH PROPOFOL POLYPECTOMY  Patient location during evaluation: Phase II Anesthesia Type: General Level of consciousness: awake Pain management: pain level controlled Vital Signs Assessment: post-procedure vital signs reviewed and stable Respiratory status: spontaneous breathing and respiratory function stable Cardiovascular status: blood pressure returned to baseline and stable Postop Assessment: no headache and no apparent nausea or vomiting Anesthetic complications: no Comments: Late entry   No notable events documented.   Last Vitals:  Vitals:   03/22/21 0834 03/22/21 1010  BP: 121/82 (!) 102/52  Pulse: 72 71  Resp: 17 15  Temp: 36.7 C 36.5 C  SpO2: 100% 100%    Last Pain:  Vitals:   03/22/21 1010  TempSrc: Oral  PainSc: 0-No pain                 Louann Sjogren

## 2021-03-25 LAB — SURGICAL PATHOLOGY

## 2021-03-27 ENCOUNTER — Encounter (HOSPITAL_COMMUNITY): Payer: Self-pay | Admitting: Internal Medicine

## 2021-03-29 ENCOUNTER — Encounter: Payer: Self-pay | Admitting: Registered Nurse

## 2021-06-04 ENCOUNTER — Encounter: Payer: Self-pay | Admitting: Registered Nurse

## 2021-08-21 ENCOUNTER — Ambulatory Visit (INDEPENDENT_AMBULATORY_CARE_PROVIDER_SITE_OTHER): Payer: BC Managed Care – PPO | Admitting: Registered Nurse

## 2021-08-21 ENCOUNTER — Encounter: Payer: Self-pay | Admitting: Registered Nurse

## 2021-08-21 VITALS — BP 128/79 | HR 74 | Resp 18 | Ht 65.0 in

## 2021-08-21 DIAGNOSIS — G2581 Restless legs syndrome: Secondary | ICD-10-CM | POA: Diagnosis not present

## 2021-08-21 DIAGNOSIS — Z0000 Encounter for general adult medical examination without abnormal findings: Secondary | ICD-10-CM

## 2021-08-21 DIAGNOSIS — Z1329 Encounter for screening for other suspected endocrine disorder: Secondary | ICD-10-CM | POA: Diagnosis not present

## 2021-08-21 DIAGNOSIS — Z13228 Encounter for screening for other metabolic disorders: Secondary | ICD-10-CM

## 2021-08-21 DIAGNOSIS — Z130 Encounter for screening for diseases of the blood and blood-forming organs and certain disorders involving the immune mechanism: Secondary | ICD-10-CM

## 2021-08-21 DIAGNOSIS — Z13220 Encounter for screening for lipoid disorders: Secondary | ICD-10-CM

## 2021-08-21 DIAGNOSIS — M25531 Pain in right wrist: Secondary | ICD-10-CM

## 2021-08-21 DIAGNOSIS — M25532 Pain in left wrist: Secondary | ICD-10-CM

## 2021-08-21 MED ORDER — GABAPENTIN 100 MG PO CAPS: 100.0000 mg | ORAL_CAPSULE | Freq: Every day | ORAL | 3 refills | Status: DC

## 2021-08-21 NOTE — Patient Instructions (Signed)
Ms. Mount -  ? ?Great to see you! ? ?Glad headaches are going well. ? ?Call if concerns arise, otherwise, if labs are good, see you in a year for a physical and labs! ? ?Thanks, ? ?Rich  ?

## 2021-08-21 NOTE — Progress Notes (Signed)
? ?Complete physical exam ? ?Patient: Kristen Dunn   DOB: June 08, 1975   46 y.o. Female  MRN: [id number] ?Visit Date: 08/21/2021 ? ?Subjective:  ?  ?Chief Complaint  ?Patient presents with  ? Annual Exam  ?  Patient states she is here for CPE  ? ? ?Kristen Dunn is a 46 y.o. female who presents today for a complete physical exam. She reports consuming a general diet.     She generally feels well. She reports sleeping well. She does have additional problems to discuss today.  ? ?Vision:Within the last year ?Dental:Within Last 6 months ?STD Screen:No ? ?Abdominal pain ?Ongoing concern for gallbladder pain. ?Has had RYGB. Did develop internal hernia that was repaired in 2014. ?Since then, has had multiple CT and an MRI of abd/pel suggesting some CBD dilation though not warranting concern. MRCP failed to show any filling defect or stones  ? ?Headaches ?Worsened around christmas. ?Had adjusted her diet - cut out artificial sweeteners. Had been up around 10 artificial sweeteners a day.  ?Headaches much improved, best in years.  ? ?Joint pain ?Mostly wrists. Onset this weekend without injury or trauma ?Fam hx of thyroid disease and RA. ?Pt states she had positive RF in her 6s, has not been worked up since.  ? ?Most recent fall risk assessment: ? ?  08/21/2021  ?  4:02 PM  ?Fall Risk   ?Falls in the past year? 0  ?Number falls in past yr: 0  ?Injury with Fall? 0  ?Risk for fall due to : No Fall Risks  ?Follow up Falls evaluation completed  ? ?  ?Most recent depression screenings: ? ?  08/21/2021  ?  4:03 PM 01/02/2021  ?  3:58 PM  ?PHQ 2/9 Scores  ?PHQ - 2 Score 0 0  ?PHQ- 9 Score 1 1  ? ? ? ?Patient Active Problem List  ? Diagnosis Date Noted  ? Encounter for screening fecal occult blood testing 01/02/2020  ? Encounter for well woman exam with routine gynecological exam 01/02/2020  ? Migraine without aura and without status migrainosus, not intractable 10/27/2017  ? Encounter for menstrual regulation 10/27/2017  ? Encounter for  gynecological examination with Papanicolaou smear of cervix 10/27/2017  ? Screening for colorectal cancer 10/27/2017  ? Musculoskeletal pain 08/21/2015  ? Superficial thrombophlebitis in puerperium 08/06/2015  ? Status post repeat low transverse cesarean section 07/30/2015  ? Consultation for female sterilization 07/17/2015  ? Left ovarian cyst 03/12/2015  ? Rubella non-immune status, antepartum 11/08/2013  ? Other and unspecified ovarian cyst 03/30/2013  ? Internal hernia 03/15/2013  ? Abdominal pain 03/14/2013  ? Hx of migraines 10/26/2012  ? ?Past Medical History:  ?Diagnosis Date  ? AMA (advanced maternal age) multigravida 60+   ? Anemia   ? Asthma   ? Degenerative lumbar disc   ? Family history of adverse reaction to anesthesia   ? mother has nausea  ? Gestational diabetes   ? glyburide  ? Hx of migraines 10/26/2012  ? Other and unspecified ovarian cyst 03/30/2013  ? PONV (postoperative nausea and vomiting)   ? ?Past Surgical History:  ?Procedure Laterality Date  ? CESAREAN SECTION  01/13/2012  ? Procedure: CESAREAN SECTION;  Surgeon: Mora Bellman, MD;  Location: [facility];  Service: Obstetrics;  Laterality: N/A;  ? CESAREAN SECTION WITH BILATERAL TUBAL LIGATION Bilateral 07/30/2015  ? Procedure: REPEAT CESAREAN SECTION WITH BILATERAL TUBAL LIGATION, WIDE EXCISION OF CICHEIX;  Surgeon: Jonnie Kind, MD;  Location: [facility];  Service: Obstetrics;  Laterality: Bilateral;  ? COLONOSCOPY WITH PROPOFOL N/A 03/22/2021  ? Procedure: COLONOSCOPY WITH PROPOFOL;  Surgeon: Eloise Harman, DO;  [facility];  Service: Endoscopy;  Laterality: N/A;  9:30 / ASA II  ? Gastric Bypass 2008    ? HERNIA REPAIR    ? INCISIONAL HERNIA REPAIR  03/31/2012  ? Procedure: LAPAROSCOPIC INCISIONAL HERNIA;  Surgeon: Edward Jolly, MD;  Location: [facility];  Service: General;  Laterality: N/A;  LAPAROSCOPY REPAIR INTERNAL HERNIA  petersons defect  ? LAPAROSCOPIC GASTRIC BANDING WITH HIATAL HERNIA REPAIR N/A 03/14/2013  ?  Procedure: DIAGNOSTIC LAPAROSCOPY  WITH  INTERNAL HERNIA  REDUCTION WITH CLOSURE;  Surgeon: Shann Medal, MD;  Location: [facility];  Service: General;  Laterality: N/A;  ? POLYPECTOMY  03/22/2021  ? Procedure: POLYPECTOMY;  Surgeon: Eloise Harman, DO;  [facility];  Service: Endoscopy;;  ? TONSILLECTOMY    ? ?Social History  ? ?Tobacco Use  ? Smoking status: Never  ? Smokeless tobacco: Never  ?Vaping Use  ? Vaping Use: Never used  ?Substance Use Topics  ? Alcohol use: Yes  ?  Comment: rarely  ? Drug use: No  ? ?Family Status  ?Relation Name Status  ? Mother  Alive  ? Father  Deceased  ? Sister  Alive  ? Brother  Alive  ? MGM  Deceased  ? MGF  Deceased  ? PGM  Deceased  ? PGF  Deceased  ? Daughter  Alive  ? Son jt Alive  ? [place]  ? Cousin  (Not Specified)  ? ?Family History  ?Problem Relation Age of Onset  ? COPD Mother   ? Asthma Mother   ? Skin cancer Mother   ? Hypertension Father   ? Aneurysm Father   ? Asthma Sister   ? Thyroid disease Maternal Grandmother   ? Stroke Maternal Grandmother   ? Cancer Maternal Aunt   ?     breast  ? Breast cancer Maternal Aunt   ? Von Willebrand disease Cousin   ? ?Allergies  ?Allergen Reactions  ? Penicillins Anaphylaxis  ?  Reaction occurred as a very small child; anaphylaxis likely but not completely certain ?Has patient had a PCN reaction causing immediate rash, facial/tongue/throat swelling, SOB or lightheadedness with hypotension: Yes ?Has patient had a PCN reaction causing severe rash involving mucus membranes or skin necrosis: No ?Has patient had a PCN reaction that required hospitalization No ?Has patient had a PCN reaction occurring within the last 10 years: No ?If all of the above answers are "NO", then may procee  ? Aspirin   ?  Intolerance because of gastric bypass surgery ?Asthma flares up  ? Alupent [Metaproterenol] Palpitations and Other (See Comments)  ?  hyperadrenergic tremor  ? Lactose Intolerance (Gi) Diarrhea  ? Latex Itching and  Dermatitis  ? ?  ?Patient Care Team: ?Maximiano Coss, NP as PCP - General (Adult Health Nurse Practitioner) ?Starlyn Skeans as Librarian, academic (Dermatology)  ? ?Medications: ?Outpatient Medications Prior to Visit  ?Medication Sig  ? acetaminophen (TYLENOL) 325 MG tablet Take 650 mg by mouth as needed.  ? acetaminophen (TYLENOL) 500 MG tablet Take 1,000 mg by mouth every 6 (six) hours as needed (pain.).  ? albuterol (VENTOLIN HFA) 108 (90 Base) MCG/ACT inhaler Inhale 1-2 puffs into the lungs every 6 (six) hours as needed for wheezing or shortness of breath.  ? BLISOVI FE 1/20 1-20 MG-MCG tablet Take  1 tablet by mouth in the morning.  ? Multiple Vitamin (MULTIVITAMIN WITH MINERALS) TABS tablet Take 1 tablet by mouth in the morning.  ? Na Sulfate-K Sulfate-Mg Sulf 17.5-3.13-1.6 GM/177ML SOLN Take 354 mLs by mouth as directed.  ? rizatriptan (MAXALT) 10 MG tablet Take 1 tablet (10 mg total) by mouth as needed for migraine. May repeat in 2 hours if needed  ? [DISCONTINUED] gabapentin (NEURONTIN) 100 MG capsule Take 100 mg by mouth 3 (three) times daily as needed (pain.).  ? ?No facility-administered medications prior to visit.  ? ? ?Review of Systems  ?Constitutional: Negative.   ?HENT: Negative.    ?Eyes: Negative.   ?Respiratory: Negative.    ?Cardiovascular: Negative.   ?Gastrointestinal: Negative.   ?Genitourinary: Negative.   ?Musculoskeletal: Negative.   ?Skin: Negative.   ?Neurological: Negative.   ?Psychiatric/Behavioral: Negative.    ?All other systems reviewed and are negative. ? ?Last CBC ?Lab Results  ?Component Value Date  ? WBC 4.8 02/22/2021  ? HGB 13.9 02/22/2021  ? HCT 41.8 02/22/2021  ? MCV 93.1 02/22/2021  ? MCH 28.5 07/31/2015  ? RDW 13.8 02/22/2021  ? PLT 371.0 02/22/2021  ? ?Last metabolic panel ?Lab Results  ?Component Value Date  ? GLUCOSE 92 02/22/2021  ? NA 139 02/22/2021  ? K 4.8 02/22/2021  ? CL 104 02/22/2021  ? CO2 28 02/22/2021  ? BUN 12 02/22/2021  ? CREATININE 0.81  02/22/2021  ? GFRNONAA >60 04/28/2015  ? CALCIUM 9.7 02/22/2021  ? PROT 7.4 02/22/2021  ? ALBUMIN 4.3 02/22/2021  ? BILITOT 1.2 02/22/2021  ? ALKPHOS 54 02/22/2021  ? AST 18 02/22/2021  ? ALT 9 02/22/2021  ? ANIONGAP 7 0

## 2021-08-23 ENCOUNTER — Ambulatory Visit: Payer: BC Managed Care – PPO | Admitting: Registered Nurse

## 2021-09-05 ENCOUNTER — Other Ambulatory Visit (INDEPENDENT_AMBULATORY_CARE_PROVIDER_SITE_OTHER): Payer: BC Managed Care – PPO

## 2021-09-05 DIAGNOSIS — M25531 Pain in right wrist: Secondary | ICD-10-CM

## 2021-09-05 DIAGNOSIS — M25532 Pain in left wrist: Secondary | ICD-10-CM | POA: Diagnosis not present

## 2021-09-05 DIAGNOSIS — Z13220 Encounter for screening for lipoid disorders: Secondary | ICD-10-CM | POA: Diagnosis not present

## 2021-09-05 DIAGNOSIS — Z1329 Encounter for screening for other suspected endocrine disorder: Secondary | ICD-10-CM | POA: Diagnosis not present

## 2021-09-05 DIAGNOSIS — Z130 Encounter for screening for diseases of the blood and blood-forming organs and certain disorders involving the immune mechanism: Secondary | ICD-10-CM | POA: Diagnosis not present

## 2021-09-05 DIAGNOSIS — Z13228 Encounter for screening for other metabolic disorders: Secondary | ICD-10-CM | POA: Diagnosis not present

## 2021-09-05 LAB — COMPREHENSIVE METABOLIC PANEL
ALT: 9 U/L (ref 0–35)
AST: 15 U/L (ref 0–37)
Albumin: 3.9 g/dL (ref 3.5–5.2)
Alkaline Phosphatase: 52 U/L (ref 39–117)
BUN: 9 mg/dL (ref 6–23)
CO2: 26 mEq/L (ref 19–32)
Calcium: 9 mg/dL (ref 8.4–10.5)
Chloride: 106 mEq/L (ref 96–112)
Creatinine, Ser: 0.83 mg/dL (ref 0.40–1.20)
GFR: 84.16 mL/min (ref >60.00)
Glucose, Bld: 90 mg/dL (ref 70–99)
Potassium: 4.2 mEq/L (ref 3.5–5.1)
Sodium: 138 mEq/L (ref 135–145)
Total Bilirubin: 1.2 mg/dL (ref 0.2–1.2)
Total Protein: 6.7 g/dL (ref 6.0–8.3)

## 2021-09-05 LAB — CBC WITH DIFFERENTIAL/PLATELET
Basophils Absolute: 0 10*3/uL (ref 0.0–0.1)
Basophils Relative: 0.4 % (ref 0.0–3.0)
Eosinophils Absolute: 0.1 10*3/uL (ref 0.0–0.7)
Eosinophils Relative: 3.4 % (ref 0.0–5.0)
HCT: 38.1 % (ref 36.0–46.0)
Hemoglobin: 12.6 g/dL (ref 12.0–15.0)
Lymphocytes Relative: 37.4 % (ref 12.0–46.0)
Lymphs Abs: 1.4 10*3/uL (ref 0.7–4.0)
MCHC: 33 g/dL (ref 30.0–36.0)
MCV: 91.9 fl (ref 78.0–100.0)
Monocytes Absolute: 0.3 10*3/uL (ref 0.1–1.0)
Monocytes Relative: 6.6 % (ref 3.0–12.0)
Neutro Abs: 2 10*3/uL (ref 1.4–7.7)
Neutrophils Relative %: 52.2 % (ref 43.0–77.0)
Platelets: 341 10*3/uL (ref 150.0–400.0)
RBC: 4.14 Mil/uL (ref 3.87–5.11)
RDW: 13.7 % (ref 11.5–15.5)
WBC: 3.8 10*3/uL — ABNORMAL LOW (ref 4.0–10.5)

## 2021-09-05 LAB — TSH: TSH: 1.24 u[IU]/mL (ref 0.35–5.50)

## 2021-09-05 LAB — HEMOGLOBIN A1C: Hgb A1c MFr Bld: 5.7 % (ref 4.6–6.5)

## 2021-09-05 LAB — LIPID PANEL
Cholesterol: 195 mg/dL (ref 0–200)
HDL: 68.1 mg/dL (ref >39.00)
LDL Cholesterol: 112 mg/dL — ABNORMAL HIGH (ref 0–99)
NonHDL: 127.26
Total CHOL/HDL Ratio: 3
Triglycerides: 75 mg/dL (ref 0.0–149.0)
VLDL: 15 mg/dL (ref 0.0–40.0)

## 2021-09-06 NOTE — Progress Notes (Signed)
Overall very reassuring results. Borderline sugars and cholesterol - stay active and eat healthy. We'll recheck annually.  Negative Rheumatoid Factor, but still possible to have rheumatoid arthritis. Keep an eye on things and we can consider further work up.

## 2021-09-09 LAB — ANA: Anti Nuclear Antibody (ANA): NEGATIVE

## 2021-09-09 LAB — RHEUMATOID FACTOR: Rheumatoid fact SerPl-aCnc: <14 IU/mL (ref <14)

## 2021-09-23 ENCOUNTER — Other Ambulatory Visit: Payer: Self-pay | Admitting: Registered Nurse

## 2021-09-23 DIAGNOSIS — Z8669 Personal history of other diseases of the nervous system and sense organs: Secondary | ICD-10-CM

## 2021-09-24 MED ORDER — RIZATRIPTAN BENZOATE 10 MG PO TABS: 10.0000 mg | ORAL_TABLET | ORAL | 0 refills | Status: DC | PRN

## 2021-10-11 ENCOUNTER — Encounter: Payer: Self-pay | Admitting: Registered Nurse

## 2021-10-14 ENCOUNTER — Other Ambulatory Visit: Payer: Self-pay

## 2021-10-14 DIAGNOSIS — J4520 Mild intermittent asthma, uncomplicated: Secondary | ICD-10-CM

## 2021-10-14 MED ORDER — ALBUTEROL SULFATE HFA 108 (90 BASE) MCG/ACT IN AERS: 1.0000 | INHALATION_SPRAY | Freq: Four times a day (QID) | RESPIRATORY_TRACT | 0 refills | Status: AC | PRN

## 2021-11-13 ENCOUNTER — Ambulatory Visit: Payer: BC Managed Care – PPO | Admitting: Physician Assistant

## 2021-12-19 ENCOUNTER — Ambulatory Visit (INDEPENDENT_AMBULATORY_CARE_PROVIDER_SITE_OTHER): Payer: BC Managed Care – PPO | Admitting: Family Medicine

## 2021-12-19 ENCOUNTER — Encounter: Payer: Self-pay | Admitting: Family Medicine

## 2021-12-19 ENCOUNTER — Encounter: Payer: Self-pay | Admitting: Family

## 2021-12-19 VITALS — BP 132/88 | HR 79 | Temp 97.6°F | Ht 65.0 in | Wt 192.1 lb

## 2021-12-19 DIAGNOSIS — R300 Dysuria: Secondary | ICD-10-CM | POA: Insufficient documentation

## 2021-12-19 LAB — POC URINALSYSI DIPSTICK (AUTOMATED)
Bilirubin, UA: NEGATIVE
Blood, UA: NEGATIVE
Glucose, UA: NEGATIVE
Ketones, UA: NEGATIVE
Nitrite, UA: NEGATIVE
Protein, UA: NEGATIVE
Spec Grav, UA: 1.01 (ref 1.010–1.025)
Urobilinogen, UA: 0.2 E.U./dL
pH, UA: 6 (ref 5.0–8.0)

## 2021-12-19 LAB — POCT UA - MICROSCOPIC ONLY: RBC, Urine, Miroscopic: 0 (ref 0–2)

## 2021-12-19 MED ORDER — SULFAMETHOXAZOLE-TRIMETHOPRIM 800-160 MG PO TABS: 1.0000 | ORAL_TABLET | Freq: Two times a day (BID) | ORAL | 0 refills | Status: DC

## 2021-12-19 NOTE — Patient Instructions (Addendum)
Drink lots of water  Take the bactrim as directed   If your symptoms suddenly worsen let us know  If really sick-go to the ER   We will alert you when the culture returns

## 2021-12-19 NOTE — Progress Notes (Signed)
Subjective:    Patient ID: Kristen Dunn, female    DOB: 1975/08/17, 46 y.o.   MRN: [id number]  HPI 46 yo pt of Dr Orland Mustard presents for urinary symptoms   Wt Readings from Last 3 Encounters:  12/19/21 192 lb 2 oz (87.1 kg)  03/22/21 180 lb (81.6 kg)  02/26/21 183 lb (83 kg)   31.97 kg/m  Started yesterday  Burning and frequency and urgency  No blood in urine   She increased her water  Subsided a bit  No new flank pain   A little low back pain  No bladder pain    Stopped oral contraception last week    Results for orders placed or performed in visit on 12/19/21  POCT Urinalysis Dipstick (Automated)  Result Value Ref Range   Color, UA Light Yellow    Clarity, UA Clear    Glucose, UA Negative Negative   Bilirubin, UA Negative    Ketones, UA Negative    Spec Grav, UA 1.010 1.010 - 1.025   Blood, UA Negative    pH, UA 6.0 5.0 - 8.0   Protein, UA Negative Negative   Urobilinogen, UA 0.2 0.2 or 1.0 E.U./dL   Nitrite, UA Negative    Leukocytes, UA Trace (A) Negative  POCT UA - Microscopic Only  Result Value Ref Range   WBC, Ur, HPF, POC 1-4 0 - 5   RBC, Urine, Miroscopic 0 0 - 2   Bacteria, U Microscopic few None - Trace   Mucus, UA few    Epithelial cells, urine per micros few    Crystals, Ur, HPF, POC none    Casts, Ur, LPF, POC none    Yeast, UA none      Patient Active Problem List   Diagnosis Date Noted   Dysuria 12/19/2021   Encounter for screening fecal occult blood testing 01/02/2020   Encounter for well woman exam with routine gynecological exam 01/02/2020   Migraine without aura and without status migrainosus, not intractable 10/27/2017   Encounter for menstrual regulation 10/27/2017   Encounter for gynecological examination with Papanicolaou smear of cervix 10/27/2017   Screening for colorectal cancer 10/27/2017   Musculoskeletal pain 08/21/2015   Superficial thrombophlebitis in puerperium 08/06/2015   Status post repeat low transverse cesarean  section 07/30/2015   Consultation for female sterilization 07/17/2015   Left ovarian cyst 03/12/2015   Rubella non-immune status, antepartum 11/08/2013   Other and unspecified ovarian cyst 03/30/2013   Internal hernia 03/15/2013   Abdominal pain 03/14/2013   Hx of migraines 10/26/2012   Past Medical History:  Diagnosis Date   AMA (advanced maternal age) multigravida 35+    Anemia    Asthma    Degenerative lumbar disc    Family history of adverse reaction to anesthesia    mother has nausea   Gestational diabetes    glyburide   Hx of migraines 10/26/2012   Other and unspecified ovarian cyst 03/30/2013   PONV (postoperative nausea and vomiting)    Past Surgical History:  Procedure Laterality Date   CESAREAN SECTION  01/13/2012   Procedure: CESAREAN SECTION;  Surgeon: Mora Bellman, MD;  Location: [facility];  Service: Obstetrics;  Laterality: N/A;   CESAREAN SECTION WITH BILATERAL TUBAL LIGATION Bilateral 07/30/2015   Procedure: REPEAT CESAREAN SECTION WITH BILATERAL TUBAL LIGATION, WIDE EXCISION OF CICHEIX;  Surgeon: Jonnie Kind, MD;  Location: [facility];  Service: Obstetrics;  Laterality: Bilateral;   COLONOSCOPY WITH PROPOFOL N/A 03/22/2021   Procedure:  COLONOSCOPY WITH PROPOFOL;  Surgeon: Eloise Harman, DO;  [facility];  Service: Endoscopy;  Laterality: N/A;  9:30 / ASA II   Gastric Bypass 2008     HERNIA REPAIR     INCISIONAL HERNIA REPAIR  03/31/2012   Procedure: LAPAROSCOPIC INCISIONAL HERNIA;  Surgeon: Edward Jolly, MD;  Location: [facility];  Service: General;  Laterality: N/A;  LAPAROSCOPY REPAIR INTERNAL HERNIA  petersons defect   LAPAROSCOPIC GASTRIC BANDING WITH HIATAL HERNIA REPAIR N/A 03/14/2013   Procedure: DIAGNOSTIC LAPAROSCOPY  WITH  INTERNAL HERNIA  REDUCTION WITH CLOSURE;  Surgeon: Shann Medal, MD;  Location: [facility];  Service: General;  Laterality: N/A;   POLYPECTOMY  03/22/2021   Procedure: POLYPECTOMY;  Surgeon: Eloise Harman, DO;   [facility];  Service: Endoscopy;;   TONSILLECTOMY     Social History   Tobacco Use   Smoking status: Never   Smokeless tobacco: Never  Vaping Use   Vaping Use: Never used  Substance Use Topics   Alcohol use: Yes    Comment: rarely   Drug use: No   Family History  Problem Relation Age of Onset   COPD Mother    Asthma Mother    Skin cancer Mother    Hypertension Father    Aneurysm Father    Asthma Sister    Thyroid disease Maternal Grandmother    Stroke Maternal Grandmother    Cancer Maternal Aunt        breast   Breast cancer Maternal Aunt    Von Willebrand disease Cousin    Allergies  Allergen Reactions   Penicillins Anaphylaxis    Reaction occurred as a very small child; anaphylaxis likely but not completely certain Has patient had a PCN reaction causing immediate rash, facial/tongue/throat swelling, SOB or lightheadedness with hypotension: Yes Has patient had a PCN reaction causing severe rash involving mucus membranes or skin necrosis: No Has patient had a PCN reaction that required hospitalization No Has patient had a PCN reaction occurring within the last 10 years: No If all of the above answers are "NO", then may procee   Aspirin     Intolerance because of gastric bypass surgery Asthma flares up   Alupent [Metaproterenol] Palpitations and Other (See Comments)    hyperadrenergic tremor   Lactose Intolerance (Gi) Diarrhea   Latex Itching and Dermatitis   Current Outpatient Medications on File Prior to Visit  Medication Sig Dispense Refill   acetaminophen (TYLENOL) 325 MG tablet Take 650 mg by mouth as needed.     acetaminophen (TYLENOL) 500 MG tablet Take 1,000 mg by mouth every 6 (six) hours as needed (pain.).     albuterol (VENTOLIN HFA) 108 (90 Base) MCG/ACT inhaler Inhale 1-2 puffs into the lungs every 6 (six) hours as needed for wheezing or shortness of breath. 8 g 0   gabapentin (NEURONTIN) 100 MG capsule Take 1 capsule (100 mg total) by  mouth at bedtime. 90 capsule 3   Multiple Vitamin (MULTIVITAMIN WITH MINERALS) TABS tablet Take 1 tablet by mouth in the morning.     rizatriptan (MAXALT) 10 MG tablet Take 1 tablet (10 mg total) by mouth as needed for migraine. May repeat in 2 hours if needed 10 tablet 0   BLISOVI FE 1/20 1-20 MG-MCG tablet Take 1 tablet by mouth in the morning. (Patient not taking: Reported on 12/19/2021)     No current facility-administered medications on file prior to visit.     Review of  Systems  Constitutional:  Positive for fatigue. Negative for activity change, appetite change and fever.  HENT:  Negative for congestion and sore throat.   Eyes:  Negative for itching and visual disturbance.  Respiratory:  Negative for cough and shortness of breath.   Cardiovascular:  Negative for leg swelling.  Gastrointestinal:  Negative for abdominal distention, abdominal pain, constipation, diarrhea and nausea.  Endocrine: Negative for cold intolerance and polydipsia.  Genitourinary:  Positive for dysuria, frequency and urgency. Negative for difficulty urinating, flank pain and hematuria.  Musculoskeletal:  Negative for myalgias.  Skin:  Negative for rash.  Allergic/Immunologic: Negative for immunocompromised state.  Neurological:  Negative for dizziness and weakness.  Hematological:  Negative for adenopathy.       Objective:   Physical Exam Constitutional:      General: She is not in acute distress.    Appearance: She is well-developed.  HENT:     Head: Normocephalic and atraumatic.  Eyes:     Conjunctiva/sclera: Conjunctivae normal.     Pupils: Pupils are equal, round, and reactive to light.  Cardiovascular:     Rate and Rhythm: Normal rate and regular rhythm.     Heart sounds: Normal heart sounds.  Pulmonary:     Effort: Pulmonary effort is normal.     Breath sounds: Normal breath sounds.  Abdominal:     General: Bowel sounds are normal. There is no distension.     Palpations: Abdomen is soft.      Tenderness: There is abdominal tenderness. There is no rebound.     Comments: No cva tenderness  Mild suprapubic tenderness  Musculoskeletal:     Cervical back: Normal range of motion and neck supple.  Lymphadenopathy:     Cervical: No cervical adenopathy.  Skin:    Findings: No rash.  Neurological:     Mental Status: She is alert.           Assessment & Plan:   Problem List Items Addressed This Visit       Other   Dysuria - Primary    Fairly classic uti symptoms improved after water intake  ua is borderline tx with 5 d of bactrim cx pending  Enc fluids  inst to call if worse  Handout given      Relevant Orders   POCT Urinalysis Dipstick (Automated) (Completed)   Urine Culture   POCT UA - Microscopic Only (Completed)

## 2021-12-19 NOTE — Assessment & Plan Note (Signed)
Fairly classic uti symptoms improved after water intake  ua is borderline tx with 5 d of bactrim cx pending  Enc fluids  inst to call if worse  Handout given

## 2021-12-20 LAB — URINE CULTURE
MICRO NUMBER:: 13858618
SPECIMEN QUALITY:: ADEQUATE

## 2022-01-10 ENCOUNTER — Encounter: Payer: Self-pay | Admitting: Adult Health

## 2022-01-10 ENCOUNTER — Ambulatory Visit (INDEPENDENT_AMBULATORY_CARE_PROVIDER_SITE_OTHER): Payer: BC Managed Care – PPO | Admitting: Adult Health

## 2022-01-10 VITALS — BP 156/97 | HR 68 | Ht 65.0 in | Wt 188.2 lb

## 2022-01-10 DIAGNOSIS — N951 Menopausal and female climacteric states: Secondary | ICD-10-CM

## 2022-01-10 DIAGNOSIS — Z01419 Encounter for gynecological examination (general) (routine) without abnormal findings: Secondary | ICD-10-CM

## 2022-01-10 DIAGNOSIS — G43009 Migraine without aura, not intractable, without status migrainosus: Secondary | ICD-10-CM | POA: Diagnosis not present

## 2022-01-10 NOTE — Progress Notes (Signed)
Patient ID: Kristen Dunn, female   DOB: 1975/06/14, 46 y.o.   MRN: [id number] History of Present Illness: Kristen Dunn is a 46 year old white female, married, [id number] in for a well woman gyn exam. She has felt sluggish and has night sweats, she stopped OCs, and seems to have more migraines now. Will wake up at night, too. Has job stress, on new project.  Last pap was 01/02/21 was negative malignancy and HPV.  PCP is [facility].   Current Medications, Allergies, Past Medical History, Past Surgical History, Family History and Social History were reviewed in [employer] record.     Review of Systems: Patient denies any hearing loss, fatigue, blurred vision, shortness of breath, chest pain, abdominal pain, problems with bowel movements, urination, or intercourse. No joint pain or mood swings.  See HPI for positives.   Physical Exam:BP (!) 156/97 (BP Location: Right Arm, Patient Position: Sitting, Cuff Size: Normal)   Pulse 68   Ht '5\' 5"'$  (1.651 m)   Wt 188 lb 4 oz (85.4 kg)   BMI 31.33 kg/m   General:  Well developed, well nourished, no acute distress Skin:  Warm and dry Neck:  Midline trachea, normal thyroid, good ROM, no lymphadenopathy Lungs; Clear to auscultation bilaterally Breast:  No dominant palpable mass, retraction, or nipple discharge Cardiovascular: Regular rate and rhythm Abdomen:  Soft, non tender, no hepatosplenomegaly Pelvic:  External genitalia is normal in appearance, no lesions.  The vagina is normal in appearance. Urethra has no lesions or masses. The cervix is bulbous.  Uterus is felt to be normal size, shape, and contour.  No adnexal masses or tenderness noted.Bladder is non tender, no masses felt. Rectal: Deferred Extremities/musculoskeletal:  No swelling or varicosities noted, no clubbing or cyanosis Psych:  No mood changes, alert and cooperative,seems happy AA is 2 Fall risk is low    01/10/2022    9:44 AM 08/21/2021    4:03 PM 01/02/2021     3:58 PM  Depression screen PHQ 2/9  Decreased Interest 0 0 0  Down, Depressed, Hopeless 0 0 0  PHQ - 2 Score 0 0 0  Altered sleeping 1 0 1  Tired, decreased energy 1 1 0  Change in appetite 0 0 0  Feeling bad or failure about yourself  0 0 0  Trouble concentrating 0 0 0  Moving slowly or fidgety/restless 0 0 0  Suicidal thoughts 0 0 0  PHQ-9 Score '2 1 1  '$ Difficult doing work/chores  Not difficult at all        01/10/2022    9:44 AM 01/02/2021    4:01 PM 01/02/2020    3:33 PM  GAD 7 : Generalized Anxiety Score  Nervous, Anxious, on Edge '1 1 1  '$ Control/stop worrying 1 1 0  Worry too much - different things 1 0 1  Trouble relaxing '1 1 1  '$ Restless 0 0 0  Easily annoyed or irritable '1 1 2  '$ Afraid - awful might happen 0 1 0  Total GAD 7 Score '5 5 5  '$ Anxiety Difficulty   Not difficult at all      Upstream - 01/10/22 0943       Pregnancy Intention Screening   Does the patient want to become pregnant in the next year? No    Does the patient's partner want to become pregnant in the next year? No    Would the patient like to discuss contraceptive options today? No  Contraception Wrap Up   Current Method Female Sterilization   tubal   End Method Female Sterilization   tubal           Examination chaperoned by Andrez Grime RN  Impression and Plan:  1. Encounter for well woman exam with routine gynecological exam Pap in 2025 Physical in 1 year Had normal mammogram 03/05/20 Had colonoscopy 03/22/21  2. Perimenopause Review handout   3. Migraine without aura and without status migrainosus, not intractable

## 2022-01-17 ENCOUNTER — Other Ambulatory Visit: Payer: Self-pay | Admitting: Family

## 2022-01-17 ENCOUNTER — Ambulatory Visit (INDEPENDENT_AMBULATORY_CARE_PROVIDER_SITE_OTHER): Payer: BC Managed Care – PPO | Admitting: Family

## 2022-01-17 ENCOUNTER — Encounter: Payer: Self-pay | Admitting: Family

## 2022-01-17 VITALS — BP 130/82 | HR 78 | Temp 98.6°F | Resp 16 | Ht 65.0 in | Wt 192.2 lb

## 2022-01-17 DIAGNOSIS — G43009 Migraine without aura, not intractable, without status migrainosus: Secondary | ICD-10-CM

## 2022-01-17 DIAGNOSIS — N951 Menopausal and female climacteric states: Secondary | ICD-10-CM

## 2022-01-17 DIAGNOSIS — G44209 Tension-type headache, unspecified, not intractable: Secondary | ICD-10-CM | POA: Diagnosis not present

## 2022-01-17 DIAGNOSIS — D500 Iron deficiency anemia secondary to blood loss (chronic): Secondary | ICD-10-CM | POA: Diagnosis not present

## 2022-01-17 DIAGNOSIS — G2581 Restless legs syndrome: Secondary | ICD-10-CM

## 2022-01-17 DIAGNOSIS — Z23 Encounter for immunization: Secondary | ICD-10-CM

## 2022-01-17 DIAGNOSIS — E7800 Pure hypercholesterolemia, unspecified: Secondary | ICD-10-CM

## 2022-01-17 DIAGNOSIS — Z8719 Personal history of other diseases of the digestive system: Secondary | ICD-10-CM

## 2022-01-17 DIAGNOSIS — D72819 Decreased white blood cell count, unspecified: Secondary | ICD-10-CM | POA: Diagnosis not present

## 2022-01-17 DIAGNOSIS — R1901 Right upper quadrant abdominal swelling, mass and lump: Secondary | ICD-10-CM

## 2022-01-17 DIAGNOSIS — Z1231 Encounter for screening mammogram for malignant neoplasm of breast: Secondary | ICD-10-CM

## 2022-01-17 DIAGNOSIS — E538 Deficiency of other specified B group vitamins: Secondary | ICD-10-CM

## 2022-01-17 DIAGNOSIS — M62838 Other muscle spasm: Secondary | ICD-10-CM

## 2022-01-17 DIAGNOSIS — K5900 Constipation, unspecified: Secondary | ICD-10-CM

## 2022-01-17 DIAGNOSIS — N83202 Unspecified ovarian cyst, left side: Secondary | ICD-10-CM

## 2022-01-17 LAB — CBC WITH DIFFERENTIAL/PLATELET
Basophils Absolute: 0.1 10*3/uL (ref 0.0–0.1)
Basophils Relative: 3.2 % — ABNORMAL HIGH (ref 0.0–3.0)
Eosinophils Absolute: 0.2 10*3/uL (ref 0.0–0.7)
Eosinophils Relative: 5 % (ref 0.0–5.0)
HCT: 38.7 % (ref 36.0–46.0)
Hemoglobin: 12.7 g/dL (ref 12.0–15.0)
Lymphocytes Relative: 36.4 % (ref 12.0–46.0)
Lymphs Abs: 1.4 10*3/uL (ref 0.7–4.0)
MCHC: 32.8 g/dL (ref 30.0–36.0)
MCV: 92.1 fl (ref 78.0–100.0)
Monocytes Absolute: 0.4 10*3/uL (ref 0.1–1.0)
Monocytes Relative: 9.3 % (ref 3.0–12.0)
Neutro Abs: 1.8 10*3/uL (ref 1.4–7.7)
Neutrophils Relative %: 46.1 % (ref 43.0–77.0)
Platelets: 313 10*3/uL (ref 150.0–400.0)
RBC: 4.21 Mil/uL (ref 3.87–5.11)
RDW: 13.6 % (ref 11.5–15.5)
WBC: 4 10*3/uL (ref 4.0–10.5)

## 2022-01-17 LAB — SEDIMENTATION RATE: Sed Rate: 8 mm/hr (ref 0–20)

## 2022-01-17 LAB — VITAMIN B12: Vitamin B-12: 247 pg/mL (ref 211–911)

## 2022-01-17 MED ORDER — BUTALBITAL-APAP-CAFFEINE 50-325-40 MG PO TABS: 1.0000 | ORAL_TABLET | Freq: Four times a day (QID) | ORAL | 0 refills | Status: DC | PRN

## 2022-01-17 MED ORDER — METAXALONE 800 MG PO TABS: ORAL_TABLET | ORAL | 0 refills | Status: DC

## 2022-01-17 NOTE — Patient Instructions (Addendum)
 ------------------------------------    Your imaging for ultrasound abdomen and ultrasound pelvis  Has been ordered at the following location.  Please call to schedule a time and date that would work for you.    [address] [place] Phone [phone],  8-430 pm   ------------------------------------  Trial Fioricet for your headaches, take this for abruption and see if this helps.    ------------------------------------  I have sent an electronic order over to your preferred location for the following:   '[]'$   2D Mammogram  '[x]'$   3D Mammogram  '[]'$   Bone Density   Please give this center a call to get scheduled at your convenience.   '[x]'$   [facility]       [place], [place]         Make sure to wear two piece  clothing  No lotions powders or deodorants the day of the appointment Make sure to bring picture ID and insurance card.  Bring list of medications you are currently taking including any supplements.    ------------------------------------  Welcome to our clinic, I am happy to have you as my new patient. I am excited to continue on this healthcare journey with you.  Stop by the lab prior to leaving today. I will notify you of your results once received.   Please keep in mind Any my chart messages you send have up to a three business day turnaround for a response.  Phone calls may take up to a one full business day turnaround for a  response.   If you need a medication refill I recommend you request it through the pharmacy as this is easiest for [place] rather than sending a message and or phone call.   Due to recent changes in healthcare laws, you may see results of your imaging and/or laboratory studies on MyChart before I have had a chance to review them.  I understand that in some cases there may be results that are confusing or concerning to you. Please understand that not all results  are received at the same time and often I may need to interpret multiple results in order to provide you with the best plan of care or course of treatment. Therefore, I ask that you please give me 2 business days to thoroughly review all your results before contacting my office for clarification. Should we see a critical lab result, you will be contacted sooner.   It was a pleasure seeing you today! Please do not hesitate to reach out with any questions and or concerns.  Regards,   Eugenia Pancoast FNP-C

## 2022-01-17 NOTE — Assessment & Plan Note (Signed)
Ordering transab ultrasound as palpable tenderness on exam Ovarian cyst vs distal colon with possible constipation Have d/w pt restarting birth control to help alleviate some symptoms

## 2022-01-17 NOTE — Progress Notes (Unsigned)
Established Patient Office Visit  Subjective:  Patient ID: Kristen Dunn, female    DOB: 1975-10-01  Age: 46 y.o. MRN: [id number]  CC:  Chief Complaint  Patient presents with   Transitions Of Care    HPI Kristen Dunn is here for a transition of care visit.  Prior provider was: Pt is without acute concerns.   Pap 03/22/21 Mammogram: [place]   chronic concerns:  Migraines: since teenager, did see neurology maybe in her 20's or something, saw headache specialist several times as well. Tried to stop birth control to see if improvement but instead headaches worsened. Headaches have been everyday for the last few weeks. Has been having to take rizatriptan more often, and taking tylenol/ibuprofen together as needed almost daily. She has a migraine currently today as well. No auras. She does have neck pain and tension prior as well. Almost always on the left side of her neck.    Has tried topamax in the past , but if they increased the dose she would have   Hallucinations.   Has tried to increase her water intake in the last few weeks. Stopped drinking sodas as well , now drinking soda water more often.   Past Medical History:  Diagnosis Date   AMA (advanced maternal age) multigravida 35+    Anemia    Asthma    Degenerative lumbar disc    Family history of adverse reaction to anesthesia    mother has nausea   Gestational diabetes    glyburide   Hx of migraines 10/26/2012   Other and unspecified ovarian cyst 03/30/2013   PONV (postoperative nausea and vomiting)     Past Surgical History:  Procedure Laterality Date   CESAREAN SECTION  01/13/2012   Procedure: CESAREAN SECTION;  Surgeon: Mora Bellman, MD;  Location: [facility];  Service: Obstetrics;  Laterality: N/A;   CESAREAN SECTION WITH BILATERAL TUBAL LIGATION Bilateral 07/30/2015   Procedure: REPEAT CESAREAN SECTION WITH BILATERAL TUBAL LIGATION, WIDE EXCISION OF CICHEIX;  Surgeon: Jonnie Kind, MD;  Location: [facility];   Service: Obstetrics;  Laterality: Bilateral;   COLONOSCOPY WITH PROPOFOL N/A 03/22/2021   Procedure: COLONOSCOPY WITH PROPOFOL;  Surgeon: Eloise Harman, DO;  [facility];  Service: Endoscopy;  Laterality: N/A;  9:30 / ASA II   Gastric Bypass 2008     HERNIA REPAIR     INCISIONAL HERNIA REPAIR  03/31/2012   Procedure: LAPAROSCOPIC INCISIONAL HERNIA;  Surgeon: Edward Jolly, MD;  Location: [facility];  Service: General;  Laterality: N/A;  LAPAROSCOPY REPAIR INTERNAL HERNIA  petersons defect   LAPAROSCOPIC GASTRIC BANDING WITH HIATAL HERNIA REPAIR N/A 03/14/2013   Procedure: DIAGNOSTIC LAPAROSCOPY  WITH  INTERNAL HERNIA  REDUCTION WITH CLOSURE;  Surgeon: Shann Medal, MD;  Location: [facility];  Service: General;  Laterality: N/A;   POLYPECTOMY  03/22/2021   Procedure: POLYPECTOMY;  Surgeon: Eloise Harman, DO;  [facility];  Service: Endoscopy;;   TONSILLECTOMY      Family History  Problem Relation Age of Onset   COPD Mother    Asthma Mother    Skin cancer Mother    Hypertension Father    Aneurysm Father        died age 12   Asthma Sister    Thyroid disease Maternal Grandmother    Stroke Maternal Grandmother    Breast cancer Maternal Aunt    Von Willebrand disease Cousin     Social History  Socioeconomic History   Marital status: Married    Spouse name: Not on file   Number of children: 2   Years of education: Not on file   Highest education level: Not on file  Occupational History   Occupation: [employer]  Tobacco Use   Smoking status: Never   Smokeless tobacco: Never  Vaping Use   Vaping Use: Never used  Substance and Sexual Activity   Alcohol use: Yes    Comment: rarely   Drug use: No   Sexual activity: Yes    Partners: Male    Birth control/protection: Surgical    Comment: tubal  Other Topics Concern   Not on file  Social History Narrative   2022 - 8yo and 46yo   Social Determinants of Health   Financial Resource Strain: Low Risk   (01/10/2022)   Overall Financial Resource Strain (CARDIA)    Difficulty of Paying Living Expenses: Not hard at all  Food Insecurity: No Food Insecurity (01/02/2021)   Hunger Vital Sign    Worried About Running Out of Food in the Last Year: Never true    Ran Out of Food in the Last Year: Never true  Transportation Needs: No Transportation Needs (01/10/2022)   PRAPARE - Hydrologist (Medical): No    Lack of Transportation (Non-Medical): No  Physical Activity: Insufficiently Active (01/10/2022)   Exercise Vital Sign    Days of Exercise per Week: 3 days    Minutes of Exercise per Session: 20 min  Stress: Stress Concern Present (01/10/2022)   [place]    Feeling of Stress : To some extent  Social Connections: Socially Integrated (01/10/2022)   Social Connection and Isolation Panel [NHANES]    Frequency of Communication with Friends and Family: More than three times a week    Frequency of Social Gatherings with Friends and Family: Once a week    Attends Religious Services: More than 4 times per year    Active Member of [employer] or Organizations: Yes    Attends Music therapist: More than 4 times per year    Marital Status: Married  Human resources officer Violence: Not At Risk (01/02/2021)   Humiliation, Afraid, Rape, and Kick questionnaire    Fear of Current or Ex-Partner: No    Emotionally Abused: No    Physically Abused: No    Sexually Abused: No    Outpatient Medications Prior to Visit  Medication Sig Dispense Refill   acetaminophen (TYLENOL) 500 MG tablet Take 1,000 mg by mouth every 6 (six) hours as needed (pain.).     albuterol (VENTOLIN HFA) 108 (90 Base) MCG/ACT inhaler Inhale 1-2 puffs into the lungs every 6 (six) hours as needed for wheezing or shortness of breath. 8 g 0   BLISOVI FE 1/20 1-20 MG-MCG tablet Take 1 tablet by mouth in the morning.     gabapentin (NEURONTIN) 100 MG  capsule Take 1 capsule (100 mg total) by mouth at bedtime. 90 capsule 3   Multiple Vitamin (MULTIVITAMIN WITH MINERALS) TABS tablet Take 1 tablet by mouth in the morning.     rizatriptan (MAXALT) 10 MG tablet Take 1 tablet (10 mg total) by mouth as needed for migraine. May repeat in 2 hours if needed 10 tablet 0   acetaminophen (TYLENOL) 325 MG tablet Take 650 mg by mouth as needed.     No facility-administered medications prior to visit.    Allergies  Allergen Reactions  Penicillins Anaphylaxis    Reaction occurred as a very small child; anaphylaxis likely but not completely certain Has patient had a PCN reaction causing immediate rash, facial/tongue/throat swelling, SOB or lightheadedness with hypotension: Yes Has patient had a PCN reaction causing severe rash involving mucus membranes or skin necrosis: No Has patient had a PCN reaction that required hospitalization No Has patient had a PCN reaction occurring within the last 10 years: No If all of the above answers are "NO", then may procee   Aspirin     Intolerance because of gastric bypass surgery Asthma flares up   Alupent [Metaproterenol] Palpitations and Other (See Comments)    hyperadrenergic tremor   Lactose Intolerance (Gi) Diarrhea   Latex Itching and Dermatitis        Objective:    Physical Exam Vitals reviewed.  Constitutional:      General: She is not in acute distress.    Appearance: Normal appearance. She is not ill-appearing or toxic-appearing.  HENT:     Head: Normocephalic.     Right Ear: Tympanic membrane normal.     Left Ear: Tympanic membrane normal.     Nose: Nasal deformity (left nares slightly smaller than right, since birth) and congestion present. No nasal tenderness.     Right Turbinates: Not swollen.     Left Turbinates: Not swollen.     Right Sinus: No frontal sinus tenderness.     Left Sinus: No frontal sinus tenderness.     Mouth/Throat:     Mouth: Mucous membranes are moist.      Pharynx: Posterior oropharyngeal erythema (mild cobblestoning) present. No pharyngeal swelling.     Tonsils: No tonsillar exudate.  Eyes:     Extraocular Movements: Extraocular movements intact.     Conjunctiva/sclera: Conjunctivae normal.     Pupils: Pupils are equal, round, and reactive to light.  Neck:     Thyroid: No thyroid mass.  Cardiovascular:     Rate and Rhythm: Normal rate and regular rhythm.  Pulmonary:     Effort: Pulmonary effort is normal.     Breath sounds: Normal breath sounds.  Abdominal:     General: Abdomen is flat. Bowel sounds are normal.     Palpations: Abdomen is soft.     Tenderness: There is abdominal tenderness (ruq mild tenderness on palpation with densities palpated with tenderness) in the suprapubic area and left lower quadrant.     Hernia: No hernia is present.    Musculoskeletal:        General: Normal range of motion.  Lymphadenopathy:     Cervical:     Right cervical: No superficial cervical adenopathy.    Left cervical: No superficial cervical adenopathy.  Skin:    General: Skin is warm.     Capillary Refill: Capillary refill takes less than 2 seconds.  Neurological:     General: No focal deficit present.     Mental Status: She is alert and oriented to person, place, and time.     Cranial Nerves: No cranial nerve deficit or facial asymmetry.     Sensory: Sensation is intact.     Coordination: Coordination is intact.     Gait: Gait is intact.  Psychiatric:        Mood and Affect: Mood normal.        Behavior: Behavior normal.        Thought Content: Thought content normal.        Judgment: Judgment normal.  BP 130/82   Pulse 78   Temp 98.6 F (37 C)   Resp 16   Ht '5\' 5"'$  (1.651 m)   Wt 192 lb 4 oz (87.2 kg)   SpO2 99%   BMI 31.99 kg/m  Wt Readings from Last 3 Encounters:  01/17/22 192 lb 4 oz (87.2 kg)  01/10/22 188 lb 4 oz (85.4 kg)  12/19/21 192 lb 2 oz (87.1 kg)     There are no preventive care reminders to display  for this patient.   There are no preventive care reminders to display for this patient.  Lab Results  Component Value Date   TSH 1.24 09/05/2021   Lab Results  Component Value Date   WBC 3.8 (L) 09/05/2021   HGB 12.6 09/05/2021   HCT 38.1 09/05/2021   MCV 91.9 09/05/2021   PLT 341.0 09/05/2021   Lab Results  Component Value Date   NA 138 09/05/2021   K 4.2 09/05/2021   CO2 26 09/05/2021   GLUCOSE 90 09/05/2021   BUN 9 09/05/2021   CREATININE 0.83 09/05/2021   BILITOT 1.2 09/05/2021   ALKPHOS 52 09/05/2021   AST 15 09/05/2021   ALT 9 09/05/2021   PROT 6.7 09/05/2021   ALBUMIN 3.9 09/05/2021   CALCIUM 9.0 09/05/2021   ANIONGAP 7 04/28/2015   GFR 84.16 09/05/2021   Lab Results  Component Value Date   CHOL 195 09/05/2021   Lab Results  Component Value Date   HDL 68.10 09/05/2021   Lab Results  Component Value Date   LDLCALC 112 (H) 09/05/2021   Lab Results  Component Value Date   TRIG 75.0 09/05/2021   Lab Results  Component Value Date   CHOLHDL 3 09/05/2021   Lab Results  Component Value Date   HGBA1C 5.7 09/05/2021      Assessment & Plan:   Problem List Items Addressed This Visit       Cardiovascular and Mediastinum   Migraine without aura and without status migrainosus, not intractable    Trial fioricet today RX sent in        Relevant Medications   butalbital-acetaminophen-caffeine (FIORICET) 50-325-40 MG tablet   metaxalone (SKELAXIN) 800 MG tablet     Endocrine   Left ovarian cyst    Ordering transab ultrasound as palpable tenderness on exam Ovarian cyst vs distal colon with possible constipation Have d/w pt restarting birth control to help alleviate some symptoms      Relevant Orders   US PELVIS (TRANSABDOMINAL ONLY)     Other   Perimenopause    Report hot flashes, considering to restart ocp  Advised pt I think this would be reasonable as prior d/c did not improve migraines, actually worsened.        Restless leg syndrome  - Primary    Checking b12 today pending results.  Continue gabapentin 100 mg qhs prn as prescribed.       Relevant Orders   Vitamin B12   Encounter for vaccination    Td  vaccine administered in office Pt tolerated procedure well  Verbal consent obtained prior to administration  Handout given in regards to vaccination.        Relevant Orders   Td : Tetanus/diphtheria >7yo Preservative  free (Completed)   Right upper quadrant abdominal mass    Palpable density on exam, suspected scar tissue from gastric bypass however will order u/s abdomen complete as also with some ruq abdominal tenderness and left lower quadrant abd tenderness.  Relevant Orders   US Abdomen Complete   Constipation    Pt advised of following:  Add fiber supplement once daily.  Add a probiotic (such as Florastor) daily. Drink 64 oz of water a day. Eat lots of fresh fruit and veggies. Ensure regular exercise.    If you are not able to have regular BM's with the above regimen, you may add miralax 1 tablespoon daily.  Increase or decrease amount/frequency as needed to ensure 1 soft BM/day.   Advised pt we can also consider linzess in the future if necessary.       History of gastric polyp   Tension headache    Trial fioriciet rx sent in  Muscle relaxer prn neck pain Heat to neck as needed for spasm  Neck exercises daily       Relevant Medications   butalbital-acetaminophen-caffeine (FIORICET) 50-325-40 MG tablet   metaxalone (SKELAXIN) 800 MG tablet   Other Relevant Orders   Sedimentation rate   Iron deficiency anemia due to chronic blood loss    Repeat cbc pending results      Leukopenia   Relevant Orders   CBC with Differential   Elevated LDL cholesterol level   Muscle spasm    rx skelaxin prn muscle spasm       Relevant Medications   metaxalone (SKELAXIN) 800 MG tablet   Other Visit Diagnoses     Encounter for screening mammogram for malignant neoplasm of breast       Relevant  Orders   MM 3D SCREEN BREAST BILATERAL       Meds ordered this encounter  Medications   butalbital-acetaminophen-caffeine (FIORICET) 50-325-40 MG tablet    Sig: Take 1 tablet by mouth every 6 (six) hours as needed for headache.    Dispense:  14 tablet    Refill:  0    Order Specific Question:   Supervising Provider    Answer:   BEDSOLE, AMY E [[id number]]   metaxalone (SKELAXIN) 800 MG tablet    Sig: Take one po qhs prn muscle spasm or tension headache. Do not take with headache medication fioricet    Dispense:  30 tablet    Refill:  0    Order Specific Question:   Supervising Provider    Answer:   Diona Browner, AMY E [[id number]]    Follow-up: Return in about 6 months (around 07/18/2022) for f/u migraine.    Eugenia Pancoast, FNP

## 2022-01-17 NOTE — Assessment & Plan Note (Signed)
Palpable density on exam, suspected scar tissue from gastric bypass however will order u/s abdomen complete as also with some ruq abdominal tenderness and left lower quadrant abd tenderness.

## 2022-01-17 NOTE — Assessment & Plan Note (Signed)
Report hot flashes, considering to restart ocp  Advised pt I think this would be reasonable as prior d/c did not improve migraines, actually worsened.

## 2022-01-17 NOTE — Assessment & Plan Note (Signed)
rx skelaxin prn muscle spasm

## 2022-01-17 NOTE — Assessment & Plan Note (Signed)
Trial fioricet today RX sent in

## 2022-01-17 NOTE — Assessment & Plan Note (Signed)
Trial fioriciet rx sent in  Muscle relaxer prn neck pain Heat to neck as needed for spasm  Neck exercises daily

## 2022-01-17 NOTE — Assessment & Plan Note (Signed)
Repeat cbc pending results 

## 2022-01-17 NOTE — Assessment & Plan Note (Signed)
Checking b12 today pending results.  Continue gabapentin 100 mg qhs prn as prescribed.

## 2022-01-17 NOTE — Assessment & Plan Note (Signed)
Pt advised of following:  Add fiber supplement once daily.  Add a probiotic (such as Florastor) daily. Drink 64 oz of water a day. Eat lots of fresh fruit and veggies. Ensure regular exercise.    If you are not able to have regular BM's with the above regimen, you may add miralax 1 tablespoon daily.  Increase or decrease amount/frequency as needed to ensure 1 soft BM/day.   Advised pt we can also consider linzess in the future if necessary.

## 2022-01-17 NOTE — Assessment & Plan Note (Signed)
Td  vaccine administered in office Pt tolerated procedure well  Verbal consent obtained prior to administration  Handout given in regards to vaccination.

## 2022-01-20 ENCOUNTER — Encounter: Payer: Self-pay | Admitting: Family

## 2022-01-20 ENCOUNTER — Other Ambulatory Visit: Payer: Self-pay | Admitting: Family

## 2022-01-20 DIAGNOSIS — G43009 Migraine without aura, not intractable, without status migrainosus: Secondary | ICD-10-CM

## 2022-01-20 DIAGNOSIS — E538 Deficiency of other specified B group vitamins: Secondary | ICD-10-CM

## 2022-01-20 DIAGNOSIS — M62838 Other muscle spasm: Secondary | ICD-10-CM

## 2022-01-20 MED ORDER — PROMETHAZINE HCL 12.5 MG PO TABS: ORAL_TABLET | ORAL | 0 refills | Status: DC

## 2022-01-20 MED ORDER — CYCLOBENZAPRINE HCL 10 MG PO TABS: ORAL_TABLET | ORAL | 0 refills | Status: DC

## 2022-01-20 MED ORDER — PREDNISONE 20 MG PO TABS: ORAL_TABLET | ORAL | 0 refills | Status: DC

## 2022-01-20 MED ORDER — NURTEC 75 MG PO TBDP: 75.0000 mg | ORAL_TABLET | ORAL | 2 refills | Status: DC

## 2022-01-20 MED ORDER — CYANOCOBALAMIN 1000 MCG/ML IJ SOLN: INTRAMUSCULAR | 2 refills | Status: DC

## 2022-01-21 ENCOUNTER — Encounter: Payer: Self-pay | Admitting: Neurology

## 2022-01-26 ENCOUNTER — Encounter (HOSPITAL_COMMUNITY): Payer: Self-pay | Admitting: Emergency Medicine

## 2022-01-26 ENCOUNTER — Other Ambulatory Visit: Payer: Self-pay

## 2022-01-26 ENCOUNTER — Emergency Department (HOSPITAL_COMMUNITY)
Admission: EM | Admit: 2022-01-26 | Discharge: 2022-01-26 | Disposition: A | Discharge status: Home / Self Care | Payer: BC Managed Care – PPO | Attending: Emergency Medicine | Admitting: Emergency Medicine

## 2022-01-26 DIAGNOSIS — Z91040 Latex allergy status: Secondary | ICD-10-CM | POA: Insufficient documentation

## 2022-01-26 DIAGNOSIS — Z79899 Other long term (current) drug therapy: Secondary | ICD-10-CM | POA: Diagnosis not present

## 2022-01-26 DIAGNOSIS — K5900 Constipation, unspecified: Secondary | ICD-10-CM | POA: Diagnosis not present

## 2022-01-26 MED ORDER — GOLYTELY 236 G PO SOLR: ORAL | 0 refills | Status: DC

## 2022-01-26 NOTE — ED Provider Notes (Signed)
[facility] EMERGENCY DEPARTMENT Provider Note   CSN: [id number] Arrival date & time: 01/26/22  6010     History {Add pertinent medical, surgical, social history, OB history to HPI:1} Chief Complaint  Patient presents with   Constipation    Kristen Dunn is a 46 y.o. female.  Pt is a 46 yo female with pmh of gastric bypass in 2008 presenting for constipation. Pt admits to struggling with constipation intermittently since gastric bpyass. Recent episode has been for 6 days. Pt admits to passing fluctuance** and small hard bowel movements with straining after Miralax x several days, Milk of Mag 60 ml once a day for two days, and glycerin suppository x 3 days. Denies nausea, vomiting, or abdominal pain.   The history is provided by the patient. No language interpreter was used.  Constipation Associated symptoms: no abdominal pain, no back pain, no dysuria, no fever and no vomiting        Home Medications Prior to Admission medications   Medication Sig Start Date End Date Taking? Authorizing Provider  acetaminophen (TYLENOL) 500 MG tablet Take 1,000 mg by mouth every 6 (six) hours as needed (pain.).    [provider]  albuterol (VENTOLIN HFA) 108 (90 Base) MCG/ACT inhaler Inhale 1-2 puffs into the lungs every 6 (six) hours as needed for wheezing or shortness of breath. 10/14/21   Maximiano Coss, NP  BLISOVI FE 1/20 1-20 MG-MCG tablet Take 1 tablet by mouth in the morning. 01/14/21   [provider]  cyanocobalamin (VITAMIN B12) 1000 MCG/ML injection Inject 1 ml 1000 mcg total into muscle once a month for three months 01/20/22   Eugenia Pancoast, FNP  cyclobenzaprine (FLEXERIL) 10 MG tablet Take 1/2 to one tablet tid prn muscle spasm 01/20/22   Eugenia Pancoast, FNP  gabapentin (NEURONTIN) 100 MG capsule Take 1 capsule (100 mg total) by mouth at bedtime. 08/21/21   Maximiano Coss, NP  Multiple Vitamin (MULTIVITAMIN WITH MINERALS) TABS tablet Take 1 tablet by mouth in the  morning.    [provider]  predniSONE (DELTASONE) 20 MG tablet Take two tablets po qd for four days then one tablet once daily for four days 01/20/22   Eugenia Pancoast, FNP  promethazine (PHENERGAN) 12.5 MG tablet Take one po tid prn nausea with migraine 01/20/22   Eugenia Pancoast, FNP  Rimegepant Sulfate (NURTEC) 75 MG TBDP Take 75 mg by mouth every other day. 01/20/22   Eugenia Pancoast, FNP      Allergies    Penicillins, Aspirin, Fioricet [butalbital-apap-caffeine], Alupent [metaproterenol], Lactose intolerance (gi), and Latex    Review of Systems   Review of Systems  Constitutional:  Negative for chills and fever.  HENT:  Negative for ear pain and sore throat.   Eyes:  Negative for pain and visual disturbance.  Respiratory:  Negative for cough and shortness of breath.   Cardiovascular:  Negative for chest pain and palpitations.  Gastrointestinal:  Positive for constipation. Negative for abdominal pain and vomiting.  Genitourinary:  Negative for dysuria and hematuria.  Musculoskeletal:  Negative for arthralgias and back pain.  Skin:  Negative for color change and rash.  Neurological:  Negative for seizures and syncope.  All other systems reviewed and are negative.   Physical Exam Updated Vital Signs BP (!) 146/80   Temp 98.1 F (36.7 C)   Ht '5\' 5"'$  (1.651 m)   Wt 83.9 kg   SpO2 100%   BMI 30.79 kg/m  Physical Exam Vitals and nursing note reviewed.  Constitutional:      General: She is not in acute distress.    Appearance: She is well-developed.  HENT:     Head: Normocephalic and atraumatic.  Eyes:     Conjunctiva/sclera: Conjunctivae normal.  Cardiovascular:     Rate and Rhythm: Normal rate and regular rhythm.     Heart sounds: No murmur heard. Pulmonary:     Effort: Pulmonary effort is normal. No respiratory distress.     Breath sounds: Normal breath sounds.  Abdominal:     Palpations: Abdomen is soft.     Tenderness: There is no abdominal tenderness.   Musculoskeletal:        General: No swelling.     Cervical back: Neck supple.  Skin:    General: Skin is warm and dry.     Capillary Refill: Capillary refill takes less than 2 seconds.  Neurological:     Mental Status: She is alert.  Psychiatric:        Mood and Affect: Mood normal.     ED Results / Procedures / Treatments   Labs (all labs ordered are listed, but only abnormal results are displayed) Labs Reviewed - No data to display  EKG None  Radiology No results found.  Procedures Procedures  {Document cardiac monitor, telemetry assessment procedure when appropriate:1}  Medications Ordered in ED Medications - No data to display  ED Course/ Medical Decision Making/ A&P                           Medical Decision Making  8:36 AM 46 yo female with pmh of gastric bypass in 2008 presenting for constipation.  {Document critical care time when appropriate:1} {Document review of labs and clinical decision tools ie heart score, Chads2Vasc2 etc:1}  {Document your independent review of radiology images, and any outside records:1} {Document your discussion with family members, caretakers, and with consultants:1} {Document social determinants of health affecting pt's care:1} {Document your decision making why or why not admission, treatments were needed:1} Final Clinical Impression(s) / ED Diagnoses Final diagnoses:  None    Rx / DC Orders ED Discharge Orders     None

## 2022-01-26 NOTE — ED Triage Notes (Signed)
Pt ambulatory to ER with c/o "severe" constipation.  States she has been on muscle relaxers "which always constipate me".  Pt reports last "good" BM was Monday.  States she has had "little ones".  Pt states she has tried, MOM, miralax, and suppositories.  States she is passing gas, but no stool.

## 2022-01-27 ENCOUNTER — Ambulatory Visit
Admission: RE | Admit: 2022-01-27 | Discharge: 2022-01-27 | Disposition: A | Discharge status: Home / Self Care | Payer: BC Managed Care – PPO | Source: Ambulatory Visit | Attending: Family | Admitting: Family

## 2022-01-27 ENCOUNTER — Other Ambulatory Visit: Payer: Self-pay | Admitting: Family

## 2022-01-27 ENCOUNTER — Telehealth: Payer: Self-pay

## 2022-01-27 DIAGNOSIS — N83202 Unspecified ovarian cyst, left side: Secondary | ICD-10-CM

## 2022-01-27 DIAGNOSIS — K5900 Constipation, unspecified: Secondary | ICD-10-CM | POA: Diagnosis not present

## 2022-01-27 DIAGNOSIS — R1011 Right upper quadrant pain: Secondary | ICD-10-CM | POA: Diagnosis not present

## 2022-01-27 DIAGNOSIS — R10814 Left lower quadrant abdominal tenderness: Secondary | ICD-10-CM | POA: Diagnosis not present

## 2022-01-27 DIAGNOSIS — R1901 Right upper quadrant abdominal swelling, mass and lump: Secondary | ICD-10-CM

## 2022-01-27 NOTE — Telephone Encounter (Signed)
[facility] called and stated that the orders for [place] are not put in correctly. There needs to be a separate order put in for the mass in abdomen. Please put in an abdomen limited order so imaging can schedule pt for that as it can't be done the same day as the pelvic US. Pt is aware.

## 2022-01-27 NOTE — Telephone Encounter (Signed)
Prior auth started and approved for Nurtec '75MG'$  dispersible tablets. Kristen [place] Case ID: [id number] - Rx #: [id number] We're pleased to let you know that we've approved your or your doctor's request for coverage for Nurtec '75MG'$  OR TBDP. You can now fill your prescription, and it will be covered according to your plan. As long as you remain covered by your prescription drug plan and there are no changes to your plan benefits, this request is approved from 01/27/2022 to 01/27/2023.  Approval letter sent to scanning.  Patient notified via mychart.

## 2022-01-27 NOTE — Telephone Encounter (Signed)
Great news. Approved. Pt aware.

## 2022-01-28 ENCOUNTER — Other Ambulatory Visit: Payer: Self-pay | Admitting: Family

## 2022-01-28 ENCOUNTER — Other Ambulatory Visit: Payer: Self-pay | Admitting: *Deleted

## 2022-01-28 DIAGNOSIS — R1901 Right upper quadrant abdominal swelling, mass and lump: Secondary | ICD-10-CM

## 2022-01-28 MED ORDER — BLISOVI FE 1/20 1-20 MG-MCG PO TABS: 1.0000 | ORAL_TABLET | Freq: Every morning | ORAL | 3 refills | Status: DC

## 2022-01-28 NOTE — Progress Notes (Signed)
Soft

## 2022-01-28 NOTE — Progress Notes (Signed)
Called and spoke with pt in detail about u/s. R/o cyst vs neoplasm. Pt does have GYN and will call to schedule f/u.also have sent a staff message via epic ot her gyn Derrek Monaco, NP.

## 2022-01-29 ENCOUNTER — Other Ambulatory Visit: Payer: Self-pay | Admitting: Family

## 2022-01-29 DIAGNOSIS — G43009 Migraine without aura, not intractable, without status migrainosus: Secondary | ICD-10-CM

## 2022-01-29 DIAGNOSIS — K5904 Chronic idiopathic constipation: Secondary | ICD-10-CM

## 2022-01-29 MED ORDER — LINACLOTIDE 145 MCG PO CAPS: 145.0000 ug | ORAL_CAPSULE | Freq: Every day | ORAL | 1 refills | Status: DC

## 2022-01-29 MED ORDER — AMITRIPTYLINE HCL 25 MG PO TABS: 25.0000 mg | ORAL_TABLET | Freq: Every day | ORAL | 2 refills | Status: DC

## 2022-01-30 ENCOUNTER — Encounter: Payer: Self-pay | Admitting: Adult Health

## 2022-01-30 ENCOUNTER — Ambulatory Visit
Admission: RE | Admit: 2022-01-30 | Discharge: 2022-01-30 | Disposition: A | Discharge status: Home / Self Care | Payer: BC Managed Care – PPO | Source: Ambulatory Visit | Attending: Family | Admitting: Family

## 2022-01-30 ENCOUNTER — Ambulatory Visit (INDEPENDENT_AMBULATORY_CARE_PROVIDER_SITE_OTHER): Payer: BC Managed Care – PPO | Admitting: Adult Health

## 2022-01-30 VITALS — BP 122/78 | HR 88 | Ht 65.0 in | Wt 188.0 lb

## 2022-01-30 DIAGNOSIS — R1901 Right upper quadrant abdominal swelling, mass and lump: Secondary | ICD-10-CM

## 2022-01-30 DIAGNOSIS — K5900 Constipation, unspecified: Secondary | ICD-10-CM

## 2022-01-30 DIAGNOSIS — N83202 Unspecified ovarian cyst, left side: Secondary | ICD-10-CM | POA: Diagnosis not present

## 2022-01-30 DIAGNOSIS — R102 Pelvic and perineal pain: Secondary | ICD-10-CM | POA: Insufficient documentation

## 2022-01-30 DIAGNOSIS — Z0389 Encounter for observation for other suspected diseases and conditions ruled out: Secondary | ICD-10-CM | POA: Diagnosis not present

## 2022-01-30 NOTE — Progress Notes (Signed)
  Subjective:     Patient ID: Kristen Dunn, female   DOB: [birth year], 46 y.o.   MRN: [id number]  HPI Iridessa is a 46 year old white female,married, G3P2012 in for review of US done 01/27/22, has pelvic pain and has left ovarian cyst that has increased in size.   Last pap was negative for malignancy and HPV 01/02/21.  PCP is Eugenia Pancoast NP  Review of Systems +Pelvic pain   +constipation Reviewed past medical,surgical, social and family history. Reviewed medications and allergies.  Objective:   Physical Exam BP 122/78 (BP Location: Left Arm, Patient Position: Sitting, Cuff Size: Normal)   Pulse 88   Ht '5\' 5"'$  (1.651 m)   Wt 188 lb (85.3 kg)   BMI 31.28 kg/m     Skin warm and dry. Lungs: clear to ausculation bilaterally. Cardiovascular: regular rate and rhythm.   DU:[id number]: 1. Continued indolent increase in size of a LEFT adnexal cystic lesion. It now measures up to 6.1 cm. This could reflect a benign simple cyst or a lymphocele but low-grade cystic neoplasm remains in the differential. Recommend gynecology consultation for consideration of excision versus follow-up ultrasound in 1 year.  fall risk is low  Upstream - 01/30/22 1502       Pregnancy Intention Screening   Does the patient want to become pregnant in the next year? No    Does the patient's partner want to become pregnant in the next year? No    Would the patient like to discuss contraceptive options today? No      Contraception Wrap Up   Current Method Female Sterilization    End Method Female Sterilization    Contraception Counseling Provided No             Assessment:     1. Cyst of left ovary Has 6.1 cm left ovarian cyst   2. Pelvic pain Has pain left side   3. Constipation, unspecified constipation type Taking Linzess now    Plan:     Return to see Dr Elonda Husky for pre op 02/10/22

## 2022-02-05 ENCOUNTER — Ambulatory Visit
Admission: RE | Admit: 2022-02-05 | Discharge: 2022-02-05 | Disposition: A | Discharge status: Home / Self Care | Payer: BC Managed Care – PPO | Source: Ambulatory Visit | Attending: Family | Admitting: Family

## 2022-02-05 DIAGNOSIS — Z1231 Encounter for screening mammogram for malignant neoplasm of breast: Secondary | ICD-10-CM

## 2022-02-10 ENCOUNTER — Encounter: Payer: Self-pay | Admitting: Obstetrics & Gynecology

## 2022-02-10 ENCOUNTER — Ambulatory Visit (INDEPENDENT_AMBULATORY_CARE_PROVIDER_SITE_OTHER): Payer: BC Managed Care – PPO | Admitting: Obstetrics & Gynecology

## 2022-02-10 VITALS — BP 128/81 | HR 78 | Ht 65.0 in | Wt 192.0 lb

## 2022-02-10 DIAGNOSIS — N83202 Unspecified ovarian cyst, left side: Secondary | ICD-10-CM

## 2022-02-10 NOTE — Progress Notes (Signed)
Follow up appointment for results: Left ovarian cyst  Chief Complaint  Patient presents with   Pre-op Exam    Cyst on left ovary    Blood pressure 128/81, pulse 78, height '5\' 5"'$  (1.651 m), weight 192 lb (87.1 kg).  MM 3D SCREEN BREAST BILATERAL  Result Date: 02/06/2022 CLINICAL DATA:  Screening. EXAM: DIGITAL SCREENING BILATERAL MAMMOGRAM WITH TOMOSYNTHESIS AND CAD TECHNIQUE: Bilateral screening digital craniocaudal and mediolateral oblique mammograms were obtained. Bilateral screening digital breast tomosynthesis was performed. The images were evaluated with computer-aided detection. COMPARISON:  Previous exam(s). ACR Breast Density Category c: The breast tissue is heterogeneously dense, which may obscure small masses. FINDINGS: There are no findings suspicious for malignancy. IMPRESSION: No mammographic evidence of malignancy. A result letter of this screening mammogram will be mailed directly to the patient. RECOMMENDATION: Screening mammogram in one year. (Code:SM-B-01Y) BI-RADS CATEGORY  1: Negative. Electronically Signed   By: Lovey Newcomer M.D.   On: 02/06/2022 15:57   US Abdomen Limited  Result Date: 01/30/2022 CLINICAL DATA:  Palpable area of concern within the mid anterior abdominal wall. EXAM: ULTRASOUND ABDOMEN LIMITED COMPARISON:  None Available. FINDINGS: Images at the palpable area of concern within the mid right abdomen were obtained. No definite solid or cystic mass identified. IMPRESSION: No definite solid or cystic mass identified at the palpable area of concern within the mid right abdomen. Electronically Signed   By: Lovey Newcomer M.D.   On: 01/30/2022 09:53   US PELVIS (TRANSABDOMINAL ONLY)  Result Date: 01/27/2022 CLINICAL DATA:  left lower quadrant tenderness, left ovarian cyst, constipation EXAM: TRANSABDOMINAL ULTRASOUND OF PELVIS TECHNIQUE: Transabdominal ultrasound examination of the pelvis was performed including evaluation of the uterus, ovaries, adnexal regions,  and pelvic cul-de-sac. COMPARISON:  October 11, 2018, December 27, 2015 FINDINGS: Uterus Measurements: 9.4 x 4.0 x 5.1 cm = volume: 99 mL. No fibroids or other mass visualized in the limitations of the exam. Endometrium Thickness: Approximately 10 mm. No focal abnormality visualized but evaluation is markedly limited due to transabdominal technique. Right ovary Not visualized transabdominally. Left ovary Normal ovarian tissue is not definitively visualized. There is a LEFT adnexal cyst which measures 6.1 x 3.8 x 5.6 cm. This previously measured approximately 4.7 x 5.4 x 3.3 cm. It measured 4.4 x 3.1 by 4.1 cm in similar transabdominal technique in 2017. Other findings:  No abnormal free fluid. IMPRESSION: 1. Continued indolent increase in size of a LEFT adnexal cystic lesion. It now measures up to 6.1 cm. This could reflect a benign simple cyst or a lymphocele but low-grade cystic neoplasm remains in the differential. Recommend gynecology consultation for consideration of excision versus follow-up ultrasound in 1 year. Electronically Signed   By: Valentino Saxon M.D.   On: 01/27/2022 16:18   US Abdomen Complete  Result Date: 01/27/2022 CLINICAL DATA:  Right upper quadrant pain EXAM: ABDOMEN ULTRASOUND COMPLETE COMPARISON:  None Available. FINDINGS: Gallbladder: No gallstones or wall thickening visualized. No sonographic Murphy sign noted by sonographer. Common bile duct: Diameter: 0.5 cm within normal limits Liver: No focal lesion identified. Within normal limits in parenchymal echogenicity. Portal vein is patent on color Doppler imaging with normal direction of blood flow towards the liver. IVC: No abnormality visualized. Pancreas: Visualized portion unremarkable. Spleen: Size and appearance within normal limits. Right Kidney: Length: 11.4 cm. Echogenicity within normal limits. No mass or hydronephrosis visualized. Left Kidney: Length: 10.7 cm. Echogenicity within normal limits. No mass or hydronephrosis  visualized. Abdominal aorta: No aneurysm visualized. Other findings:  None. IMPRESSION: No sonographic finding to explain the patient's abdominal pain. Electronically Signed   By: Audie Pinto M.D.   On: 01/27/2022 10:32       MEDS ordered this encounter: No orders of the defined types were placed in this encounter.   Orders for this encounter: Orders Placed This Encounter  Procedures   Ovarian Malignancy Risk-ROMA    Impression + Management Plan   ICD-10-CM   1. Cyst of left ovary  N83.202 Ovarian Malignancy Risk-ROMA   Likely mucinous or serous cystadenoma, ROMA pending, laparoscopic removal of left ovary 03/12/22      Follow Up: Return in about 5 weeks (around 03/20/2022) for [place], with Dr Elonda Husky.     All questions were answered.  Past Medical History:  Diagnosis Date   AMA (advanced maternal age) multigravida 35+    Anemia    Asthma    Degenerative lumbar disc    Family history of adverse reaction to anesthesia    mother has nausea   Gestational diabetes    glyburide   Hx of migraines 10/26/2012   Other and unspecified ovarian cyst 03/30/2013   PONV (postoperative nausea and vomiting)     Past Surgical History:  Procedure Laterality Date   CESAREAN SECTION  01/13/2012   Procedure: CESAREAN SECTION;  Surgeon: Mora Bellman, MD;  Location: [facility];  Service: Obstetrics;  Laterality: N/A;   CESAREAN SECTION WITH BILATERAL TUBAL LIGATION Bilateral 07/30/2015   Procedure: REPEAT CESAREAN SECTION WITH BILATERAL TUBAL LIGATION, WIDE EXCISION OF CICHEIX;  Surgeon: Jonnie Kind, MD;  Location: [facility];  Service: Obstetrics;  Laterality: Bilateral;   COLONOSCOPY WITH PROPOFOL N/A 03/22/2021   Procedure: COLONOSCOPY WITH PROPOFOL;  Surgeon: Eloise Harman, DO;  Location: [facility];  Service: Endoscopy;  Laterality: N/A;  9:30 / ASA II   Gastric Bypass 2008     HERNIA REPAIR     INCISIONAL HERNIA REPAIR  03/31/2012   Procedure: LAPAROSCOPIC INCISIONAL HERNIA;   Surgeon: Edward Jolly, MD;  Location: [facility];  Service: General;  Laterality: N/A;  LAPAROSCOPY REPAIR INTERNAL HERNIA  petersons defect   LAPAROSCOPIC GASTRIC BANDING WITH HIATAL HERNIA REPAIR N/A 03/14/2013   Procedure: DIAGNOSTIC LAPAROSCOPY  WITH  INTERNAL HERNIA  REDUCTION WITH CLOSURE;  Surgeon: Shann Medal, MD;  Location: [facility];  Service: General;  Laterality: N/A;   POLYPECTOMY  03/22/2021   Procedure: POLYPECTOMY;  Surgeon: Eloise Harman, DO;  Location: [facility];  Service: Endoscopy;;   TONSILLECTOMY      OB History     Gravida  3   Para  2   Term  2   Preterm  0   AB  1   Living  2      SAB  1   IAB  0   Ectopic  0   Multiple  0   Live Births  2           Allergies  Allergen Reactions   Penicillins Anaphylaxis    Reaction occurred as a very small child; anaphylaxis likely but not completely certain Has patient had a PCN reaction causing immediate rash, facial/tongue/throat swelling, SOB or lightheadedness with hypotension: Yes Has patient had a PCN reaction causing severe rash involving mucus membranes or skin necrosis: No Has patient had a PCN reaction that required hospitalization No Has patient had a PCN reaction occurring within the last 10 years: No If all of the above answers are "NO", then may  procee   Aspirin     Intolerance because of gastric bypass surgery Asthma flares up   Fioricet [Butalbital-Apap-Caffeine]    Alupent [Metaproterenol] Palpitations and Other (See Comments)    hyperadrenergic tremor   Lactose Intolerance (Gi) Diarrhea   Latex Itching and Dermatitis    Social History   Socioeconomic History   Marital status: Married    Spouse name: Not on file   Number of children: 2   Years of education: Not on file   Highest education level: Not on file  Occupational History   Occupation: [employer]  Tobacco Use   Smoking status: Never   Smokeless tobacco: Never  Vaping Use   Vaping Use: Never used   Substance and Sexual Activity   Alcohol use: Yes    Comment: rarely   Drug use: No   Sexual activity: Yes    Partners: Male    Birth control/protection: Surgical    Comment: tubal  Other Topics Concern   Not on file  Social History Narrative   2022 - 8yo and 46yo   Social Determinants of Health   Financial Resource Strain: Low Risk  (01/10/2022)   Overall Financial Resource Strain (CARDIA)    Difficulty of Paying Living Expenses: Not hard at all  Food Insecurity: No Food Insecurity (01/02/2021)   Hunger Vital Sign    Worried About Running Out of Food in the Last Year: Never true    Ran Out of Food in the Last Year: Never true  Transportation Needs: No Transportation Needs (01/10/2022)   PRAPARE - Hydrologist (Medical): No    Lack of Transportation (Non-Medical): No  Physical Activity: Insufficiently Active (01/10/2022)   Exercise Vital Sign    Days of Exercise per Week: 3 days    Minutes of Exercise per Session: 20 min  Stress: Stress Concern Present (01/10/2022)   [place]    Feeling of Stress : To some extent  Social Connections: Socially Integrated (01/10/2022)   Social Connection and Isolation Panel [NHANES]    Frequency of Communication with Friends and Family: More than three times a week    Frequency of Social Gatherings with Friends and Family: Once a week    Attends Religious Services: More than 4 times per year    Active Member of [employer] or Organizations: Yes    Attends Music therapist: More than 4 times per year    Marital Status: Married    Family History  Problem Relation Age of Onset   COPD Mother    Asthma Mother    Skin cancer Mother    Hypertension Father    Aneurysm Father        died age 65   Asthma Sister    Thyroid disease Maternal Grandmother    Stroke Maternal Grandmother    Breast cancer Maternal Aunt    Von Willebrand disease Cousin

## 2022-02-11 LAB — OVARIAN MALIGNANCY RISK-ROMA
Cancer Antigen (CA) 125: 11.1 U/mL (ref 0.0–38.1)
HE4: 42.5 pmol/L (ref 0.0–63.6)
Postmenopausal ROMA: 0.81
Premenopausal ROMA: 0.51

## 2022-02-11 LAB — PREMENOPAUSAL INTERP: LOW

## 2022-02-11 LAB — POSTMENOPAUSAL INTERP: LOW

## 2022-03-06 NOTE — Patient Instructions (Signed)
Kristen Dunn  03/06/2022     '@[email or web address]'$ @   Your procedure is scheduled on  03/12/2022.   Report to [facility] at  0700  A.M.   Call this number if you have problems the morning of surgery:  [phone]  If you experience any cold or flu symptoms such as cough, fever, chills, shortness of breath, etc. between now and your scheduled surgery, please notify us at the above number.   Remember:  Do not eat after midnight.    You may drink clear liquids until  0430 AM on 03/12/2022.         Clear liquids allowed are:                    Water, Juice (No red color; non-citric and without pulp; diabetics please choose diet or no sugar options), Carbonated beverages (diabetics please choose diet or no sugar options), Clear Tea (No creamer, milk, or cream, including half & half and powdered creamer), Black Coffee Only (No creamer, milk or cream, including half & half and powdered creamer), Plain Jell-O Only (No red color; diabetics please choose no sugar options), Clear Sports drink (No red color; diabetics please choose diet or no sugar options), and Plain Popsicles Only (No red color; diabetics please choose no sugar options)        At 0430 AM on 03/12/2022 drink your carb drink. You can have nothing else to drink after this.    Take these medicines the morning of surgery with A SIP OF WATER                                           None    Do not wear jewelry, make-up or nail polish.  Do not wear lotions, powders, or perfumes, or deodorant.  Do not shave 48 hours prior to surgery.  Men may shave face and neck.  Do not bring valuables to the hospital.  [facility] is not responsible for any belongings or valuables.  Contacts, dentures or bridgework may not be worn into surgery.  Leave your suitcase in the car.  After surgery it may be brought to your room.  For patients admitted to the hospital, discharge time will be determined by your treatment  team.  Patients discharged the day of surgery will not be allowed to drive home and must have someone with them for 24 hours.    Special instructions:   DO NOT smoke tobacco or vape for 24 hours before your procedure.  Please read over the following fact sheets that you were given. Pain Booklet, Coughing and Deep Breathing, Surgical Site Infection Prevention, Anesthesia Post-op Instructions, and Care and Recovery After Surgery      Ovarian Cystectomy, Care After This sheet gives you information about how to care for yourself after your procedure. Your health care provider may also give you more specific instructions. If you have problems or questions, contact your health care provider. What can I expect after the procedure? After the procedure, it is common to have: Pain in the abdomen, especially at the incision areas. You will be given pain medicine to control the pain. Tiredness. This is a normal part of the recovery process. Your energy level will return to normal over the next several weeks. Follow these instructions at home: Medicines Take over-the-counter and  prescription medicines only as told by your health care provider. If you were prescribed an antibiotic medicine, use it as told by your health care provider. Do not stop using the antibiotic even if you start to feel better. Do not take aspirin because it can cause bleeding. Ask your health care provider if the medicine prescribed to you: Requires you to avoid driving or using heavy machinery. Can cause constipation. You may need to take these actions to prevent or treat constipation: Drink enough fluid to keep your urine pale yellow. Take over-the-counter or prescription medicines. Eat foods that are high in fiber, such as beans, whole grains, and fresh fruits and vegetables. Limit foods that are high in fat and processed sugars, such as fried or sweet foods. Incision care  Follow instructions from your health care  provider about how to take care of your incisions. Make sure you: Wash your hands with soap and water for at least 20 seconds before and after you change your bandage (dressing). If soap and water are not available, use hand sanitizer. Change your dressing as told by your health care provider. Leave stitches (sutures), skin glue, or adhesive strips in place. These skin closures may need to stay in place for 2 weeks or longer. If adhesive strip edges start to loosen and curl up, you may trim the loose edges. Do not remove adhesive strips completely unless your health care provider tells you to do that. Check your incision areas every day for signs of infection. Check for: More redness, swelling, or pain. Fluid or blood. Warmth. Pus or a bad smell. Do not take baths, swim, or use a hot tub until your health care provider approves. Ask your health care provider if you may take showers. You may only be allowed to take sponge baths. Activity Rest as told by your health care provider. Avoid sitting for a long time without moving. Get up to take short walks every 1-2 hours. This is important to improve blood flow and breathing. Ask for help if you feel weak or unsteady. Do not lift anything that is heavier than 10 lb (4.5 kg), or the limit that you are told, until your health care provider says that it is safe. Return to your normal activities and diet as told by your health care provider. Ask your health care provider what activities are safe for you. General instructions Do not douche, use tampons, or have sex until your health care provider says it is okay. Do not use any products that contain nicotine or tobacco, such as cigarettes, e-cigarettes, and chewing tobacco. These can delay incision healing after surgery. If you need help quitting, ask your health care provider. Keep all follow-up visits. This is important. Contact a health care provider if: You have a fever or chills. You feel nauseous or  you vomit. You have pain when you urinate or have blood in your urine. You have a rash on your body. You have pain or redness where the IV was inserted. You have pain that is not relieved with medicine. You have any of these signs of infection: More redness, swelling, or pain around an incision. Fluid or blood coming from an incision. Warmth coming from an incision. Pus or a bad smell coming from an incision. Get help right away if: You have chest pain or shortness of breath. You feel dizzy or light-headed. You have heavy bleeding. You have increasing abdominal pain that is not relieved with medicine. You have pain, swelling, or redness  in your leg. Your incision is opening and the edges are not staying together. Summary After the procedure, it is common to have some pain in your abdomen. You will be given pain medicine to control the pain. Follow instructions from your health care provider about how to take care of your incisions. Do not douche, use tampons, or have sex until your health care provider says it okay. Keep all follow-up visits. This is important. This information is not intended to replace advice given to you by your health care provider. Make sure you discuss any questions you have with your health care provider. Document Revised: 09/15/2019 Document Reviewed: 09/15/2019 Elsevier Patient Education  [place] Anesthesia, Adult, Care After The following information offers guidance on how to care for yourself after your procedure. Your health care provider may also give you more specific instructions. If you have problems or questions, contact your health care provider. What can I expect after the procedure? After the procedure, it is common for people to: Have pain or discomfort at the IV site. Have nausea or vomiting. Have a sore throat or hoarseness. Have trouble concentrating. Feel cold or chills. Feel weak, sleepy, or tired (fatigue). Have  soreness and body aches. These can affect parts of the body that were not involved in surgery. Follow these instructions at home: For the time period you were told by your health care provider:  Rest. Do not participate in activities where you could fall or become injured. Do not drive or use machinery. Do not drink alcohol. Do not take sleeping pills or medicines that cause drowsiness. Do not make important decisions or sign legal documents. Do not take care of children on your own. General instructions Drink enough fluid to keep your urine pale yellow. If you have sleep apnea, surgery and certain medicines can increase your risk for breathing problems. Follow instructions from your health care provider about wearing your sleep device: Anytime you are sleeping, including during daytime naps. While taking prescription pain medicines, sleeping medicines, or medicines that make you drowsy. Return to your normal activities as told by your health care provider. Ask your health care provider what activities are safe for you. Take over-the-counter and prescription medicines only as told by your health care provider. Do not use any products that contain nicotine or tobacco. These products include cigarettes, chewing tobacco, and vaping devices, such as e-cigarettes. These can delay incision healing after surgery. If you need help quitting, ask your health care provider. Contact a health care provider if: You have nausea or vomiting that does not get better with medicine. You vomit every time you eat or drink. You have pain that does not get better with medicine. You cannot urinate or have bloody urine. You develop a skin rash. You have a fever. Get help right away if: You have trouble breathing. You have chest pain. You vomit blood. These symptoms may be an emergency. Get help right away. Call 911. Do not wait to see if the symptoms will go away. Do not drive yourself to the  hospital. Summary After the procedure, it is common to have a sore throat, hoarseness, nausea, vomiting, or to feel weak, sleepy, or fatigue. For the time period you were told by your health care provider, do not drive or use machinery. Get help right away if you have difficulty breathing, have chest pain, or vomit blood. These symptoms may be an emergency. This information is not intended to replace advice given to you  by your health care provider. Make sure you discuss any questions you have with your health care provider. Document Revised: 07/05/2021 Document Reviewed: 07/05/2021 Elsevier Patient Education  [place]. How to Use Chlorhexidine Before Surgery Chlorhexidine gluconate (CHG) is a germ-killing (antiseptic) solution that is used to clean the skin. It can get rid of the bacteria that normally live on the skin and can keep them away for about 24 hours. To clean your skin with CHG, you may be given: A CHG solution to use in the shower or as part of a sponge bath. A prepackaged cloth that contains CHG. Cleaning your skin with CHG may help lower the risk for infection: While you are staying in the intensive care unit of the hospital. If you have a vascular access, such as a central line, to provide short-term or long-term access to your veins. If you have a catheter to drain urine from your bladder. If you are on a ventilator. A ventilator is a machine that helps you breathe by moving air in and out of your lungs. After surgery. What are the risks? Risks of using CHG include: A skin reaction. Hearing loss, if CHG gets in your ears and you have a perforated eardrum. Eye injury, if CHG gets in your eyes and is not rinsed out. The CHG product catching fire. Make sure that you avoid smoking and flames after applying CHG to your skin. Do not use CHG: If you have a chlorhexidine allergy or have previously reacted to chlorhexidine. On babies younger than 37 months of age. How to  use CHG solution Use CHG only as told by your health care provider, and follow the instructions on the label. Use the full amount of CHG as directed. Usually, this is one bottle. During a shower Follow these steps when using CHG solution during a shower (unless your health care provider gives you different instructions): Start the shower. Use your normal soap and shampoo to wash your face and hair. Turn off the shower or move out of the shower stream. Pour the CHG onto a clean washcloth. Do not use any type of brush or rough-edged sponge. Starting at your neck, lather your body down to your toes. Make sure you follow these instructions: If you will be having surgery, pay special attention to the part of your body where you will be having surgery. Scrub this area for at least 1 minute. Do not use CHG on your head or face. If the solution gets into your ears or eyes, rinse them well with water. Avoid your genital area. Avoid any areas of skin that have broken skin, cuts, or scrapes. Scrub your back and under your arms. Make sure to wash skin folds. Let the lather sit on your skin for 1-2 minutes or as long as told by your health care provider. Thoroughly rinse your entire body in the shower. Make sure that all body creases and crevices are rinsed well. Dry off with a clean towel. Do not put any substances on your body afterward--such as powder, lotion, or perfume--unless you are told to do so by your health care provider. Only use lotions that are recommended by the manufacturer. Put on clean clothes or pajamas. If it is the night before your surgery, sleep in clean sheets.  During a sponge bath Follow these steps when using CHG solution during a sponge bath (unless your health care provider gives you different instructions): Use your normal soap and shampoo to wash your face and  hair. Pour the CHG onto a clean washcloth. Starting at your neck, lather your body down to your toes. Make sure you  follow these instructions: If you will be having surgery, pay special attention to the part of your body where you will be having surgery. Scrub this area for at least 1 minute. Do not use CHG on your head or face. If the solution gets into your ears or eyes, rinse them well with water. Avoid your genital area. Avoid any areas of skin that have broken skin, cuts, or scrapes. Scrub your back and under your arms. Make sure to wash skin folds. Let the lather sit on your skin for 1-2 minutes or as long as told by your health care provider. Using a different clean, wet washcloth, thoroughly rinse your entire body. Make sure that all body creases and crevices are rinsed well. Dry off with a clean towel. Do not put any substances on your body afterward--such as powder, lotion, or perfume--unless you are told to do so by your health care provider. Only use lotions that are recommended by the manufacturer. Put on clean clothes or pajamas. If it is the night before your surgery, sleep in clean sheets. How to use CHG prepackaged cloths Only use CHG cloths as told by your health care provider, and follow the instructions on the label. Use the CHG cloth on clean, dry skin. Do not use the CHG cloth on your head or face unless your health care provider tells you to. When washing with the CHG cloth: Avoid your genital area. Avoid any areas of skin that have broken skin, cuts, or scrapes. Before surgery Follow these steps when using a CHG cloth to clean before surgery (unless your health care provider gives you different instructions): Using the CHG cloth, vigorously scrub the part of your body where you will be having surgery. Scrub using a back-and-forth motion for 3 minutes. The area on your body should be completely wet with CHG when you are done scrubbing. Do not rinse. Discard the cloth and let the area air-dry. Do not put any substances on the area afterward, such as powder, lotion, or perfume. Put on  clean clothes or pajamas. If it is the night before your surgery, sleep in clean sheets.  For general bathing Follow these steps when using CHG cloths for general bathing (unless your health care provider gives you different instructions). Use a separate CHG cloth for each area of your body. Make sure you wash between any folds of skin and between your fingers and toes. Wash your body in the following order, switching to a new cloth after each step: The front of your neck, shoulders, and chest. Both of your arms, under your arms, and your hands. Your stomach and groin area, avoiding the genitals. Your right leg and foot. Your left leg and foot. The back of your neck, your back, and your buttocks. Do not rinse. Discard the cloth and let the area air-dry. Do not put any substances on your body afterward--such as powder, lotion, or perfume--unless you are told to do so by your health care provider. Only use lotions that are recommended by the manufacturer. Put on clean clothes or pajamas. Contact a health care provider if: Your skin gets irritated after scrubbing. You have questions about using your solution or cloth. You swallow any chlorhexidine. Call your local poison control center (1-312-698-9567 in the [place] Get help right away if: Your eyes itch badly, or they become very red or  swollen. Your skin itches badly and is red or swollen. Your hearing changes. You have trouble seeing. You have swelling or tingling in your mouth or throat. You have trouble breathing. These symptoms may represent a serious problem that is an emergency. Do not wait to see if the symptoms will go away. Get medical help right away. Call your local emergency services (911 in the [place] Do not drive yourself to the hospital. Summary Chlorhexidine gluconate (CHG) is a germ-killing (antiseptic) solution that is used to clean the skin. Cleaning your skin with CHG may help to lower your risk for infection. You may be  given CHG to use for bathing. It may be in a bottle or in a prepackaged cloth to use on your skin. Carefully follow your health care provider's instructions and the instructions on the product label. Do not use CHG if you have a chlorhexidine allergy. Contact your health care provider if your skin gets irritated after scrubbing. This information is not intended to replace advice given to you by your health care provider. Make sure you discuss any questions you have with your health care provider. Document Revised: 08/05/2021 Document Reviewed: 06/18/2020 Elsevier Patient Education  [place].

## 2022-03-07 ENCOUNTER — Other Ambulatory Visit: Payer: Self-pay | Admitting: Obstetrics & Gynecology

## 2022-03-07 DIAGNOSIS — Z01818 Encounter for other preprocedural examination: Secondary | ICD-10-CM

## 2022-03-10 ENCOUNTER — Encounter (HOSPITAL_COMMUNITY)
Admission: RE | Admit: 2022-03-10 | Discharge: 2022-03-10 | Disposition: A | Discharge status: Home / Self Care | Payer: BC Managed Care – PPO | Source: Ambulatory Visit | Attending: Obstetrics & Gynecology | Admitting: Obstetrics & Gynecology

## 2022-03-10 ENCOUNTER — Encounter (HOSPITAL_COMMUNITY): Payer: Self-pay

## 2022-03-10 DIAGNOSIS — Z01818 Encounter for other preprocedural examination: Secondary | ICD-10-CM

## 2022-03-10 DIAGNOSIS — N83202 Unspecified ovarian cyst, left side: Secondary | ICD-10-CM | POA: Diagnosis not present

## 2022-03-10 DIAGNOSIS — N83292 Other ovarian cyst, left side: Secondary | ICD-10-CM | POA: Diagnosis not present

## 2022-03-10 DIAGNOSIS — Z01812 Encounter for preprocedural laboratory examination: Secondary | ICD-10-CM | POA: Insufficient documentation

## 2022-03-10 LAB — COMPREHENSIVE METABOLIC PANEL
ALT: 10 U/L (ref 0–44)
AST: 17 U/L (ref 15–41)
Albumin: 3.6 g/dL (ref 3.5–5.0)
Alkaline Phosphatase: 60 U/L (ref 38–126)
Anion gap: 4 — ABNORMAL LOW (ref 5–15)
BUN: 14 mg/dL (ref 6–20)
CO2: 24 mmol/L (ref 22–32)
Calcium: 8.6 mg/dL — ABNORMAL LOW (ref 8.9–10.3)
Chloride: 108 mmol/L (ref 98–111)
Creatinine, Ser: 0.82 mg/dL (ref 0.44–1.00)
GFR, Estimated: >60 mL/min (ref >60)
Glucose, Bld: 95 mg/dL (ref 70–99)
Potassium: 3.9 mmol/L (ref 3.5–5.1)
Sodium: 136 mmol/L (ref 135–145)
Total Bilirubin: 0.9 mg/dL (ref 0.3–1.2)
Total Protein: 7.1 g/dL (ref 6.5–8.1)

## 2022-03-10 LAB — CBC
HCT: 40.3 % (ref 36.0–46.0)
Hemoglobin: 12.9 g/dL (ref 12.0–15.0)
MCH: 29.9 pg (ref 26.0–34.0)
MCHC: 32 g/dL (ref 30.0–36.0)
MCV: 93.3 fL (ref 80.0–100.0)
Platelets: 381 10*3/uL (ref 150–400)
RBC: 4.32 MIL/uL (ref 3.87–5.11)
RDW: 13.2 % (ref 11.5–15.5)
WBC: 4 10*3/uL (ref 4.0–10.5)
nRBC: 0 % (ref 0.0–0.2)

## 2022-03-10 LAB — URINALYSIS, ROUTINE W REFLEX MICROSCOPIC
Bilirubin Urine: NEGATIVE
Glucose, UA: NEGATIVE mg/dL
Ketones, ur: NEGATIVE mg/dL
Leukocytes,Ua: NEGATIVE
Nitrite: NEGATIVE
Protein, ur: NEGATIVE mg/dL
Specific Gravity, Urine: 1.012 (ref 1.005–1.030)
pH: 5 (ref 5.0–8.0)

## 2022-03-10 LAB — RAPID HIV SCREEN (HIV 1/2 AB+AG)
HIV 1/2 Antibodies: NONREACTIVE
HIV-1 P24 Antigen - HIV24: NONREACTIVE

## 2022-03-10 LAB — TYPE AND SCREEN
ABO/RH(D): A POS
Antibody Screen: NEGATIVE

## 2022-03-10 LAB — PREGNANCY, URINE: Preg Test, Ur: NEGATIVE

## 2022-03-12 ENCOUNTER — Ambulatory Visit (HOSPITAL_COMMUNITY)
Admission: RE | Admit: 2022-03-12 | Discharge: 2022-03-12 | Disposition: A | Discharge status: Home / Self Care | Payer: BC Managed Care – PPO | Attending: Obstetrics & Gynecology | Admitting: Obstetrics & Gynecology

## 2022-03-12 ENCOUNTER — Ambulatory Visit (HOSPITAL_COMMUNITY): Payer: BC Managed Care – PPO | Admitting: Certified Registered Nurse Anesthetist

## 2022-03-12 ENCOUNTER — Encounter (HOSPITAL_COMMUNITY)
Admission: RE | Disposition: A | Discharge status: Home / Self Care | Payer: Self-pay | Source: Home / Self Care | Attending: Obstetrics & Gynecology

## 2022-03-12 ENCOUNTER — Encounter (HOSPITAL_COMMUNITY): Payer: Self-pay | Admitting: Obstetrics & Gynecology

## 2022-03-12 ENCOUNTER — Other Ambulatory Visit: Payer: Self-pay

## 2022-03-12 DIAGNOSIS — N83202 Unspecified ovarian cyst, left side: Secondary | ICD-10-CM | POA: Diagnosis not present

## 2022-03-12 DIAGNOSIS — N951 Menopausal and female climacteric states: Secondary | ICD-10-CM

## 2022-03-12 DIAGNOSIS — N83292 Other ovarian cyst, left side: Secondary | ICD-10-CM | POA: Insufficient documentation

## 2022-03-12 DIAGNOSIS — R102 Pelvic and perineal pain: Secondary | ICD-10-CM | POA: Diagnosis not present

## 2022-03-12 HISTORY — PX: LAPAROSCOPIC OVARIAN CYSTECTOMY: SHX6248

## 2022-03-12 SURGERY — EXCISION, CYST, OVARY, LAPAROSCOPIC
Anesthesia: General | Site: Abdomen | Laterality: Bilateral

## 2022-03-12 MED ORDER — FENTANYL CITRATE PF 50 MCG/ML IJ SOSY
PREFILLED_SYRINGE | INTRAMUSCULAR | Status: AC
  Filled 2022-03-12: qty 1

## 2022-03-12 MED ORDER — PROPOFOL 500 MG/50ML IV EMUL
INTRAVENOUS | Status: AC
  Filled 2022-03-12: qty 50

## 2022-03-12 MED ORDER — PROPOFOL 10 MG/ML IV BOLUS
INTRAVENOUS | Status: DC | PRN
  Administered 2022-03-12 (×2): 50 mg via INTRAVENOUS
  Administered 2022-03-12 (×2): 30 mg via INTRAVENOUS
  Administered 2022-03-12: 50 mg via INTRAVENOUS

## 2022-03-12 MED ORDER — LACTATED RINGERS IV SOLN: INTRAVENOUS | Status: DC | PRN

## 2022-03-12 MED ORDER — HYDROCODONE-ACETAMINOPHEN 5-325 MG PO TABS: 1.0000 | ORAL_TABLET | Freq: Four times a day (QID) | ORAL | 0 refills | Status: DC | PRN

## 2022-03-12 MED ORDER — ONDANSETRON HCL 4 MG/2ML IJ SOLN: 4.0000 mg | Freq: Once | INTRAMUSCULAR | Status: DC | PRN

## 2022-03-12 MED ORDER — FENTANYL CITRATE (PF) 100 MCG/2ML IJ SOLN
INTRAMUSCULAR | Status: DC | PRN
  Administered 2022-03-12: 50 ug via INTRAVENOUS

## 2022-03-12 MED ORDER — BUPIVACAINE LIPOSOME 1.3 % IJ SUSP
20.0000 mL | Freq: Once | INTRAMUSCULAR | Status: DC
  Filled 2022-03-12: qty 20

## 2022-03-12 MED ORDER — ONDANSETRON HCL 4 MG/2ML IJ SOLN
INTRAMUSCULAR | Status: DC | PRN
  Administered 2022-03-12 (×2): 4 mg via INTRAVENOUS

## 2022-03-12 MED ORDER — SUGAMMADEX SODIUM 200 MG/2ML IV SOLN
INTRAVENOUS | Status: DC | PRN
  Administered 2022-03-12 (×4): 50 mg via INTRAVENOUS

## 2022-03-12 MED ORDER — METRONIDAZOLE 500 MG/100ML IV SOLN
INTRAVENOUS | Status: AC
  Filled 2022-03-12: qty 100

## 2022-03-12 MED ORDER — ROCURONIUM BROMIDE 10 MG/ML (PF) SYRINGE
PREFILLED_SYRINGE | INTRAVENOUS | Status: DC | PRN
  Administered 2022-03-12: 80 mg via INTRAVENOUS

## 2022-03-12 MED ORDER — DEXAMETHASONE SODIUM PHOSPHATE 10 MG/ML IJ SOLN
INTRAMUSCULAR | Status: DC | PRN
  Administered 2022-03-12: 10 mg via INTRAVENOUS

## 2022-03-12 MED ORDER — FENTANYL CITRATE PF 50 MCG/ML IJ SOSY
25.0000 ug | PREFILLED_SYRINGE | INTRAMUSCULAR | Status: DC | PRN
  Administered 2022-03-12 (×3): 50 ug via INTRAVENOUS
  Filled 2022-03-12 (×2): qty 1

## 2022-03-12 MED ORDER — CHLORHEXIDINE GLUCONATE 0.12 % MT SOLN: 15.0000 mL | Freq: Once | OROMUCOSAL | Status: DC

## 2022-03-12 MED ORDER — GLYCOPYRROLATE PF 0.2 MG/ML IJ SOSY
PREFILLED_SYRINGE | INTRAMUSCULAR | Status: DC | PRN
  Administered 2022-03-12: .2 mg via INTRAVENOUS

## 2022-03-12 MED ORDER — LACTATED RINGERS IV SOLN: INTRAVENOUS | Status: DC

## 2022-03-12 MED ORDER — POVIDONE-IODINE 10 % EX SWAB
2.0000 | Freq: Once | CUTANEOUS | Status: AC
  Administered 2022-03-12: 2 via TOPICAL

## 2022-03-12 MED ORDER — CIPROFLOXACIN IN D5W 400 MG/200ML IV SOLN
400.0000 mg | INTRAVENOUS | Status: AC
  Administered 2022-03-12: 400 mg via INTRAVENOUS

## 2022-03-12 MED ORDER — 0.9 % SODIUM CHLORIDE (POUR BTL) OPTIME
TOPICAL | Status: DC | PRN
  Administered 2022-03-12: 1000 mL

## 2022-03-12 MED ORDER — ONDANSETRON 8 MG PO TBDP: 8.0000 mg | ORAL_TABLET | Freq: Three times a day (TID) | ORAL | 0 refills | Status: DC | PRN

## 2022-03-12 MED ORDER — KETOROLAC TROMETHAMINE 30 MG/ML IJ SOLN
INTRAMUSCULAR | Status: AC
  Administered 2022-03-12: 30 mg via INTRAVENOUS
  Filled 2022-03-12: qty 1

## 2022-03-12 MED ORDER — OXYCODONE HCL 5 MG PO TABS: 5.0000 mg | ORAL_TABLET | Freq: Once | ORAL | Status: DC | PRN

## 2022-03-12 MED ORDER — KETOROLAC TROMETHAMINE 30 MG/ML IJ SOLN: 30.0000 mg | Freq: Once | INTRAMUSCULAR | Status: AC

## 2022-03-12 MED ORDER — OXYCODONE HCL 5 MG/5ML PO SOLN: 5.0000 mg | Freq: Once | ORAL | Status: DC | PRN

## 2022-03-12 MED ORDER — ORAL CARE MOUTH RINSE: 15.0000 mL | Freq: Once | OROMUCOSAL | Status: DC

## 2022-03-12 MED ORDER — PROPOFOL 500 MG/50ML IV EMUL
INTRAVENOUS | Status: DC | PRN
  Administered 2022-03-12: 150 ug/kg/min via INTRAVENOUS

## 2022-03-12 MED ORDER — CIPROFLOXACIN IN D5W 400 MG/200ML IV SOLN
INTRAVENOUS | Status: AC
  Filled 2022-03-12: qty 200

## 2022-03-12 MED ORDER — FENTANYL CITRATE (PF) 100 MCG/2ML IJ SOLN
INTRAMUSCULAR | Status: AC
  Filled 2022-03-12: qty 2

## 2022-03-12 MED ORDER — BUPIVACAINE LIPOSOME 1.3 % IJ SUSP
INTRAMUSCULAR | Status: AC
  Filled 2022-03-12: qty 20

## 2022-03-12 MED ORDER — DEXMEDETOMIDINE HCL IN NACL 80 MCG/20ML IV SOLN
INTRAVENOUS | Status: DC | PRN
  Administered 2022-03-12: 8 ug via BUCCAL
  Administered 2022-03-12: 12 ug via BUCCAL

## 2022-03-12 MED ORDER — LIDOCAINE HCL (CARDIAC) PF 100 MG/5ML IV SOSY
PREFILLED_SYRINGE | INTRAVENOUS | Status: DC | PRN
  Administered 2022-03-12: 80 mg via INTRATRACHEAL

## 2022-03-12 MED ORDER — MIDAZOLAM HCL 5 MG/5ML IJ SOLN
INTRAMUSCULAR | Status: DC | PRN
  Administered 2022-03-12 (×2): 1 mg via INTRAVENOUS

## 2022-03-12 MED ORDER — METRONIDAZOLE 500 MG/100ML IV SOLN
500.0000 mg | INTRAVENOUS | Status: AC
  Administered 2022-03-12: 500 mg via INTRAVENOUS

## 2022-03-12 MED ORDER — MIDAZOLAM HCL 2 MG/2ML IJ SOLN
INTRAMUSCULAR | Status: AC
  Filled 2022-03-12: qty 2

## 2022-03-12 MED ORDER — BUPIVACAINE LIPOSOME 1.3 % IJ SUSP
INTRAMUSCULAR | Status: DC | PRN
  Administered 2022-03-12: 20 mL

## 2022-03-12 SURGICAL SUPPLY — 42 items
ADH SKN CLS APL DERMABOND .7 (GAUZE/BANDAGES/DRESSINGS) ×1
BAG HAMPER (MISCELLANEOUS) ×1 IMPLANT
BLADE SURG SZ11 CARB STEEL (BLADE) ×1 IMPLANT
CLOTH BEACON ORANGE TIMEOUT ST (SAFETY) ×1 IMPLANT
COVER LIGHT HANDLE STERIS (MISCELLANEOUS) ×2 IMPLANT
DERMABOND ADVANCED .7 DNX12 (GAUZE/BANDAGES/DRESSINGS) IMPLANT
ELECT REM PT RETURN 9FT ADLT (ELECTROSURGICAL) ×1
ELECTRODE REM PT RTRN 9FT ADLT (ELECTROSURGICAL) ×1 IMPLANT
GAUZE 4X4 16PLY ~~LOC~~+RFID DBL (SPONGE) IMPLANT
GLOVE BIOGEL PI IND STRL 7.0 (GLOVE) ×2 IMPLANT
GLOVE BIOGEL PI IND STRL 8 (GLOVE) ×1 IMPLANT
GLOVE SURG SS PI 8.0 STRL IVOR (GLOVE) IMPLANT
GOWN STRL REUS W/TWL LRG LVL3 (GOWN DISPOSABLE) ×1 IMPLANT
GOWN STRL REUS W/TWL XL LVL3 (GOWN DISPOSABLE) ×1 IMPLANT
INST SET LAPROSCOPIC GYN AP (KITS) ×1 IMPLANT
IV NS IRRIG 3000ML ARTHROMATIC (IV SOLUTION) ×1 IMPLANT
KIT TURNOVER KIT A (KITS) ×1 IMPLANT
MANIFOLD NEPTUNE II (INSTRUMENTS) ×1 IMPLANT
NDL HYPO 21X1.5 SAFETY (NEEDLE) ×1 IMPLANT
NEEDLE HYPO 21X1.5 SAFETY (NEEDLE) ×1 IMPLANT
NEEDLE INSUFFLATION 120MM (ENDOMECHANICALS) ×1 IMPLANT
NS IRRIG 1000ML POUR BTL (IV SOLUTION) ×1 IMPLANT
PACK PERI GYN (CUSTOM PROCEDURE TRAY) ×1 IMPLANT
PAD ARMBOARD 7.5X6 YLW CONV (MISCELLANEOUS) ×1 IMPLANT
SET BASIN LINEN APH (SET/KITS/TRAYS/PACK) ×1 IMPLANT
SET TUBE IRRIG SUCTION NO TIP (IRRIGATION / IRRIGATOR) ×1 IMPLANT
SHEARS HARMONIC ACE PLUS 36CM (ENDOMECHANICALS) ×1 IMPLANT
SLEEVE Z-THREAD 5X100MM (TROCAR) ×1 IMPLANT
SOL ANTI FOG 6CC (MISCELLANEOUS) ×1 IMPLANT
SOLUTION ANTI FOG 6CC (MISCELLANEOUS) ×1
STAPLER VISISTAT 35W (STAPLE) ×1 IMPLANT
SUT VIC AB 3-0 SH 27 (SUTURE) ×1
SUT VIC AB 3-0 SH 27X BRD (SUTURE) IMPLANT
SUT VICRYL 0 UR6 27IN ABS (SUTURE) ×1 IMPLANT
SYR 10ML LL (SYRINGE) ×1 IMPLANT
SYR 20ML LL LF (SYRINGE) ×1 IMPLANT
SYS BAG RETRIEVAL 10MM (BASKET) ×1
SYSTEM BAG RETRIEVAL 10MM (BASKET) IMPLANT
TROCAR Z-THRD FIOS HNDL 11X100 (TROCAR) ×1 IMPLANT
TROCAR Z-THREAD FIOS 5X100MM (TROCAR) ×1 IMPLANT
TUBING EVAC SMOKE HEATED PNEUM (TUBING) ×1 IMPLANT
WARMER LAPAROSCOPE (MISCELLANEOUS) ×1 IMPLANT

## 2022-03-12 NOTE — Anesthesia Procedure Notes (Signed)
Procedure Name: Intubation Date/Time: 03/12/2022 7:50 AM  Performed by: Gayland Curry, CRNAPre-anesthesia Checklist: Patient identified, Emergency Drugs available, Suction available and Patient being monitored Patient Re-evaluated:Patient Re-evaluated prior to induction Oxygen Delivery Method: Circle system utilized Preoxygenation: Pre-oxygenation with 100% oxygen Induction Type: IV induction Ventilation: Mask ventilation without difficulty Laryngoscope Size: Mac and 3 Grade View: Grade I Tube type: Oral Tube size: 6.5 mm Number of attempts: 1 Placement Confirmation: ETT inserted through vocal cords under direct vision, positive ETCO2 and breath sounds checked- equal and bilateral Secured at: 22 cm Tube secured with: Tape Dental Injury: Teeth and Oropharynx as per pre-operative assessment

## 2022-03-12 NOTE — H&P (Signed)
Preoperative History and Physical  Kristen Dunn is a 46 y.o. [id number] with No LMP recorded. Patient is perimenopausal. admitted for a laparoscopic LSO.  Enlarging left ovarian/adnexal mass ROMA is normal  PMH:    Past Medical History:  Diagnosis Date   AMA (advanced maternal age) multigravida 35+    Anemia    Asthma    Degenerative lumbar disc    Family history of adverse reaction to anesthesia    mother has nausea   Gestational diabetes    glyburide   Hx of migraines 10/26/2012   Other and unspecified ovarian cyst 03/30/2013   PONV (postoperative nausea and vomiting)     PSH:     Past Surgical History:  Procedure Laterality Date   CESAREAN SECTION  01/13/2012   Procedure: CESAREAN SECTION;  Surgeon: Mora Bellman, MD;  Location: [facility];  Service: Obstetrics;  Laterality: N/A;   CESAREAN SECTION WITH BILATERAL TUBAL LIGATION Bilateral 07/30/2015   Procedure: REPEAT CESAREAN SECTION WITH BILATERAL TUBAL LIGATION, WIDE EXCISION OF CICHEIX;  Surgeon: Jonnie Kind, MD;  Location: [facility];  Service: Obstetrics;  Laterality: Bilateral;   COLONOSCOPY WITH PROPOFOL N/A 03/22/2021   Procedure: COLONOSCOPY WITH PROPOFOL;  Surgeon: Eloise Harman, DO;  Location: [facility];  Service: Endoscopy;  Laterality: N/A;  9:30 / ASA II   Gastric Bypass 2008     HERNIA REPAIR     INCISIONAL HERNIA REPAIR  03/31/2012   Procedure: LAPAROSCOPIC INCISIONAL HERNIA;  Surgeon: Edward Jolly, MD;  Location: [facility];  Service: General;  Laterality: N/A;  LAPAROSCOPY REPAIR INTERNAL HERNIA  petersons defect   LAPAROSCOPIC GASTRIC BANDING WITH HIATAL HERNIA REPAIR N/A 03/14/2013   Procedure: DIAGNOSTIC LAPAROSCOPY  WITH  INTERNAL HERNIA  REDUCTION WITH CLOSURE;  Surgeon: Shann Medal, MD;  Location: [facility];  Service: General;  Laterality: N/A;   POLYPECTOMY  03/22/2021   Procedure: POLYPECTOMY;  Surgeon: Eloise Harman, DO;  Location: [facility];  Service: Endoscopy;;   TONSILLECTOMY       POb/GynH:      OB History     Gravida  3   Para  2   Term  2   Preterm  0   AB  1   Living  2      SAB  1   IAB  0   Ectopic  0   Multiple  0   Live Births  2           SH:   Social History   Tobacco Use   Smoking status: Never   Smokeless tobacco: Never  Vaping Use   Vaping Use: Never used  Substance Use Topics   Alcohol use: Yes    Comment: rarely   Drug use: No    FH:    Family History  Problem Relation Age of Onset   COPD Mother    Asthma Mother    Skin cancer Mother    Hypertension Father    Aneurysm Father        died age 65   Asthma Sister    Thyroid disease Maternal Grandmother    Stroke Maternal Grandmother    Breast cancer Maternal Aunt    Von Willebrand disease Cousin      Allergies:  Allergies  Allergen Reactions   Penicillins Anaphylaxis    Reaction occurred as a very small child; anaphylaxis likely but not completely certain Has patient had a PCN reaction causing immediate rash, facial/tongue/throat swelling,  SOB or lightheadedness with hypotension: Yes Has patient had a PCN reaction causing severe rash involving mucus membranes or skin necrosis: No Has patient had a PCN reaction that required hospitalization No Has patient had a PCN reaction occurring within the last 10 years: No If all of the above answers are "NO", then may procee   Aspirin     Intolerance because of gastric bypass surgery Asthma flares up   Fioricet [Butalbital-Apap-Caffeine]     Didn't work for patient   Alupent [Metaproterenol] Palpitations and Other (See Comments)    hyperadrenergic tremor   Lactose Intolerance (Gi) Diarrhea   Latex Itching and Dermatitis    Medications:       Current Facility-Administered Medications:    bupivacaine liposome (EXPAREL) 1.3 % injection 266 mg, 20 mL, Infiltration, Once, Thoma Paulsen, Mertie Clause, MD   ciprofloxacin (CIPRO) 400 MG/200ML IVPB, , , ,    metroNIDAZOLE (FLAGYL) IVPB 500 mg, 500 mg, Intravenous, On Call  to OR **AND** ciprofloxacin (CIPRO) IVPB 400 mg, 400 mg, Intravenous, On Call to OR, Florian Buff, MD   metroNIDAZOLE (FLAGYL) 500 MG/100ML IVPB, , , ,   Review of Systems:   Review of Systems  Constitutional: Negative for fever, chills, weight loss, malaise/fatigue and diaphoresis.  HENT: Negative for hearing loss, ear pain, nosebleeds, congestion, sore throat, neck pain, tinnitus and ear discharge.   Eyes: Negative for blurred vision, double vision, photophobia, pain, discharge and redness.  Respiratory: Negative for cough, hemoptysis, sputum production, shortness of breath, wheezing and stridor.   Cardiovascular: Negative for chest pain, palpitations, orthopnea, claudication, leg swelling and PND.  Gastrointestinal: Positive for abdominal pain. Negative for heartburn, nausea, vomiting, diarrhea, constipation, blood in stool and melena.  Genitourinary: Negative for dysuria, urgency, frequency, hematuria and flank pain.  Musculoskeletal: Negative for myalgias, back pain, joint pain and falls.  Skin: Negative for itching and rash.  Neurological: Negative for dizziness, tingling, tremors, sensory change, speech change, focal weakness, seizures, loss of consciousness, weakness and headaches.  Endo/Heme/Allergies: Negative for environmental allergies and polydipsia. Does not bruise/bleed easily.  Psychiatric/Behavioral: Negative for depression, suicidal ideas, hallucinations, memory loss and substance abuse. The patient is not nervous/anxious and does not have insomnia.      PHYSICAL EXAM:  Blood pressure (!) 130/92, pulse 82, temperature 97.9 F (36.6 C), temperature source Oral, resp. rate 18, height '5\' 5"'$  (1.651 m), weight 83.9 kg, SpO2 100 %.    Vitals reviewed. Constitutional: She is oriented to person, place, and time. She appears well-developed and well-nourished.  HENT:  Head: Normocephalic and atraumatic.  Right Ear: External ear normal.  Left Ear: External ear normal.  Nose:  Nose normal.  Mouth/Throat: Oropharynx is clear and moist.  Eyes: Conjunctivae and EOM are normal. Pupils are equal, round, and reactive to light. Right eye exhibits no discharge. Left eye exhibits no discharge. No scleral icterus.  Neck: Normal range of motion. Neck supple. No tracheal deviation present. No thyromegaly present.  Cardiovascular: Normal rate, regular rhythm, normal heart sounds and intact distal pulses.  Exam reveals no gallop and no friction rub.   No murmur heard. Respiratory: Effort normal and breath sounds normal. No respiratory distress. She has no wheezes. She has no rales. She exhibits no tenderness.  GI: Soft. Bowel sounds are normal. She exhibits no distension and no mass. There is tenderness. There is no rebound and no guarding.  Genitourinary:       Vulva is normal without lesions Vagina is pink moist without discharge Cervix  normal in appearance and pap is normal Uterus is per sonogram Adnexa is per sonogram Musculoskeletal: Normal range of motion. She exhibits no edema and no tenderness.  Neurological: She is alert and oriented to person, place, and time. She has normal reflexes. She displays normal reflexes. No cranial nerve deficit. She exhibits normal muscle tone. Coordination normal.  Skin: Skin is warm and dry. No rash noted. No erythema. No pallor.  Psychiatric: She has a normal mood and affect. Her behavior is normal. Judgment and thought content normal.    Labs: Results for orders placed or performed during the hospital encounter of 03/10/22 (from the past 336 hour(s))  Urinalysis, Routine w reflex microscopic Urine, Clean Catch   Collection Time: 03/10/22  8:15 AM  Result Value Ref Range   Color, Urine AMBER (A) YELLOW   APPearance CLOUDY (A) CLEAR   Specific Gravity, Urine 1.012 1.005 - 1.030   pH 5.0 5.0 - 8.0   Glucose, UA NEGATIVE NEGATIVE mg/dL   Hgb urine dipstick MODERATE (A) NEGATIVE   Bilirubin Urine NEGATIVE NEGATIVE   Ketones, ur  NEGATIVE NEGATIVE mg/dL   Protein, ur NEGATIVE NEGATIVE mg/dL   Nitrite NEGATIVE NEGATIVE   Leukocytes,Ua NEGATIVE NEGATIVE   RBC / HPF 0-5 0 - 5 RBC/hpf   WBC, UA 0-5 0 - 5 WBC/hpf   Bacteria, UA RARE (A) NONE SEEN   Squamous Epithelial / LPF 0-5 0 - 5   Mucus PRESENT   Type and screen   Collection Time: 03/10/22  8:15 AM  Result Value Ref Range   ABO/RH(D) A POS    Antibody Screen NEG    Sample Expiration 03/24/2022,2359    Extend sample reason      NO TRANSFUSIONS OR PREGNANCY IN THE PAST 3 MONTHS Performed at [facility], [address], [place], [place] [address]   Pregnancy, urine   Collection Time: 03/10/22  8:47 AM  Result Value Ref Range   Preg Test, Ur NEGATIVE NEGATIVE  CBC   Collection Time: 03/10/22  9:00 AM  Result Value Ref Range   WBC 4.0 4.0 - 10.5 K/uL   RBC 4.32 3.87 - 5.11 MIL/uL   Hemoglobin 12.9 12.0 - 15.0 g/dL   HCT 40.3 36.0 - 46.0 %   MCV 93.3 80.0 - 100.0 fL   MCH 29.9 26.0 - 34.0 pg   MCHC 32.0 30.0 - 36.0 g/dL   RDW 13.2 11.5 - 15.5 %   Platelets 381 150 - 400 K/uL   nRBC 0.0 0.0 - 0.2 %  Comprehensive metabolic panel   Collection Time: 03/10/22  9:00 AM  Result Value Ref Range   Sodium 136 135 - 145 mmol/L   Potassium 3.9 3.5 - 5.1 mmol/L   Chloride 108 98 - 111 mmol/L   CO2 24 22 - 32 mmol/L   Glucose, Bld 95 70 - 99 mg/dL   BUN 14 6 - 20 mg/dL   Creatinine, Ser 0.82 0.44 - 1.00 mg/dL   Calcium 8.6 (L) 8.9 - 10.3 mg/dL   Total Protein 7.1 6.5 - 8.1 g/dL   Albumin 3.6 3.5 - 5.0 g/dL   AST 17 15 - 41 U/L   ALT 10 0 - 44 U/L   Alkaline Phosphatase 60 38 - 126 U/L   Total Bilirubin 0.9 0.3 - 1.2 mg/dL   GFR, Estimated >60 >60 mL/min   Anion gap 4 (L) 5 - 15  Rapid HIV screen (HIV 1/2 Ab+Ag)   Collection Time: 03/10/22  9:00 AM  Result Value Ref Range   HIV-1 P24 Antigen - HIV24 NON REACTIVE NON REACTIVE   HIV 1/2 Antibodies NON REACTIVE NON REACTIVE   Interpretation (HIV Ag Ab)      A non reactive test result means that HIV 1 or  HIV 2 antibodies and HIV 1 p24 antigen were not detected in the specimen.    EKG: No orders found for this or any previous visit.  Imaging Studies: MM 3D SCREEN BREAST BILATERAL  Result Date: 02/06/2022 CLINICAL DATA:  Screening. EXAM: DIGITAL SCREENING BILATERAL MAMMOGRAM WITH TOMOSYNTHESIS AND CAD TECHNIQUE: Bilateral screening digital craniocaudal and mediolateral oblique mammograms were obtained. Bilateral screening digital breast tomosynthesis was performed. The images were evaluated with computer-aided detection. COMPARISON:  Previous exam(s). ACR Breast Density Category c: The breast tissue is heterogeneously dense, which may obscure small masses. FINDINGS: There are no findings suspicious for malignancy. IMPRESSION: No mammographic evidence of malignancy. A result letter of this screening mammogram will be mailed directly to the patient. RECOMMENDATION: Screening mammogram in one year. (Code:SM-B-01Y) BI-RADS CATEGORY  1: Negative. Electronically Signed   By: Lovey Newcomer M.D.   On: 02/06/2022 15:57   US Abdomen Limited  Result Date: 01/30/2022 CLINICAL DATA:  Palpable area of concern within the mid anterior abdominal wall. EXAM: ULTRASOUND ABDOMEN LIMITED COMPARISON:  None Available. FINDINGS: Images at the palpable area of concern within the mid right abdomen were obtained. No definite solid or cystic mass identified. IMPRESSION: No definite solid or cystic mass identified at the palpable area of concern within the mid right abdomen. Electronically Signed   By: Lovey Newcomer M.D.   On: 01/30/2022 09:53   US PELVIS (TRANSABDOMINAL ONLY)  Result Date: 01/27/2022 CLINICAL DATA:  left lower quadrant tenderness, left ovarian cyst, constipation EXAM: TRANSABDOMINAL ULTRASOUND OF PELVIS TECHNIQUE: Transabdominal ultrasound examination of the pelvis was performed including evaluation of the uterus, ovaries, adnexal regions, and pelvic cul-de-sac. COMPARISON:  October 11, 2018, December 27, 2015  FINDINGS: Uterus Measurements: 9.4 x 4.0 x 5.1 cm = volume: 99 mL. No fibroids or other mass visualized in the limitations of the exam. Endometrium Thickness: Approximately 10 mm. No focal abnormality visualized but evaluation is markedly limited due to transabdominal technique. Right ovary Not visualized transabdominally. Left ovary Normal ovarian tissue is not definitively visualized. There is a LEFT adnexal cyst which measures 6.1 x 3.8 x 5.6 cm. This previously measured approximately 4.7 x 5.4 x 3.3 cm. It measured 4.4 x 3.1 by 4.1 cm in similar transabdominal technique in 2017. Other findings:  No abnormal free fluid. IMPRESSION: 1. Continued indolent increase in size of a LEFT adnexal cystic lesion. It now measures up to 6.1 cm. This could reflect a benign simple cyst or a lymphocele but low-grade cystic neoplasm remains in the differential. Recommend gynecology consultation for consideration of excision versus follow-up ultrasound in 1 year. Electronically Signed   By: Valentino Saxon M.D.   On: 01/27/2022 16:18   US Abdomen Complete  Result Date: 01/27/2022 CLINICAL DATA:  Right upper quadrant pain EXAM: ABDOMEN ULTRASOUND COMPLETE COMPARISON:  None Available. FINDINGS: Gallbladder: No gallstones or wall thickening visualized. No sonographic Murphy sign noted by sonographer. Common bile duct: Diameter: 0.5 cm within normal limits Liver: No focal lesion identified. Within normal limits in parenchymal echogenicity. Portal vein is patent on color Doppler imaging with normal direction of blood flow towards the liver. IVC: No abnormality visualized. Pancreas: Visualized portion unremarkable. Spleen: Size and appearance within normal limits. Right Kidney: Length: 11.4 cm.  Echogenicity within normal limits. No mass or hydronephrosis visualized. Left Kidney: Length: 10.7 cm. Echogenicity within normal limits. No mass or hydronephrosis visualized. Abdominal aorta: No aneurysm visualized. Other findings: None.  IMPRESSION: No sonographic finding to explain the patient's abdominal pain. Electronically Signed   By: Audie Pinto M.D.   On: 01/27/2022 10:32       Assessment: Left ovarian cyst, now > 6 cm, pt desires removal  Plan: Laparoscopic left oophorectomy salpingectomy indicated procedures  Florian Buff 03/12/2022 7:23 AM

## 2022-03-12 NOTE — Op Note (Signed)
Preoperative diagnosis:  1.  Enlarging 6 cm left ovarian cyst                                         2.  Normal ROMA pre operatively  Postoperative diagnosis:  Same as above  Procedure:  Laparoscopic left salpingo-oophorectomy  Surgeon:  Jacelyn Grip MD  Anesthesia:  Gen. Endotracheal  Findings:  .    Intraoperatively,  the right ovary appeared normal.  The left ovary had a simpla cyst smooth surface no other abnormalities. There were no other abnormalities noted.   Description of operation:  The patient was taken to the operating room and placed in the supine position where she underwent general endotracheal anesthesia.  She was placed in the low lithotomy position.  She was prepped and draped in the usual sterile fashion and a Foley catheter was placed.  An incision was made superior to the umbilicus and a Veres needle was placed into the peritoneal cavity with one pass without difficulty.  The peritoneal cavity was then insufflated.  An 11 mm non-bladed direct visualization trocar was then used and placed into the peritoneal cavity with direct laparoscopic visualization again without difficulty.  Incisions were then made in the left lower quadrant and right lower quadrant.  A 5 mm trocar was placed in the left lower quadrant and a 5 mm trocar was placed in the right lower quadrant.  Both were done under direct visualization without difficulty.  The left ovary was grasped.  The harmonic scalpel was used and the infundibulopelvic ligament on the left was coagulated and then transected.  THe remaining peritoneal attachments of the left ovary were also taken down hemostatically.  The right ovary was identified and was normal.  An Endo Catch was placed and the left ovary was put in the bag and removed through the umbilical incision without difficulty.  The umbilical fascia was closed with 0 vicryl running. The subcutaneous tissue was then reapproximated with 0 Vicryl. The skin was closed using 3-0  vicryl in a subcuticular fashion. Dermabond was placed.  Exparel was injected in all 3 incisions.  The patient was awakened from anesthesia and taken to the recovery room in good stable condition with all counts being correct x3.  The estimated blood loss for the procedure was minimal.  She received cipro + flagyl and toradol pre operatively  Florian Buff, MD 03/12/2022 8:51 AM

## 2022-03-12 NOTE — Anesthesia Preprocedure Evaluation (Signed)
Anesthesia Evaluation  Patient identified by MRN, date of birth, ID band Patient awake    Reviewed: Allergy & Precautions, H&P , NPO status , Patient's Chart, lab work & pertinent test results, reviewed documented beta blocker date and time   History of Anesthesia Complications (+) PONV, Family history of anesthesia reaction and history of anesthetic complications  Airway Mallampati: II  TM Distance: >3 FB Neck ROM: full    Dental no notable dental hx.    Pulmonary neg pulmonary ROS, asthma    Pulmonary exam normal breath sounds clear to auscultation       Cardiovascular Exercise Tolerance: Good negative cardio ROS  Rhythm:regular Rate:Normal     Neuro/Psych  Headaches negative neurological ROS  negative psych ROS   GI/Hepatic negative GI ROS, Neg liver ROS,,,  Endo/Other  negative endocrine ROSdiabetes, Type 2    Renal/GU negative Renal ROS  negative genitourinary   Musculoskeletal   Abdominal   Peds  Hematology negative hematology ROS (+) Blood dyscrasia, anemia   Anesthesia Other Findings   Reproductive/Obstetrics negative OB ROS                             Anesthesia Physical Anesthesia Plan  ASA: 2  Anesthesia Plan: General and General ETT   Post-op Pain Management:    Induction:   PONV Risk Score and Plan: Ondansetron  Airway Management Planned:   Additional Equipment:   Intra-op Plan:   Post-operative Plan:   Informed Consent: I have reviewed the patients History and Physical, chart, labs and discussed the procedure including the risks, benefits and alternatives for the proposed anesthesia with the patient or authorized representative who has indicated his/her understanding and acceptance.     Dental Advisory Given  Plan Discussed with: CRNA  Anesthesia Plan Comments:        Anesthesia Quick Evaluation

## 2022-03-12 NOTE — Transfer of Care (Signed)
Immediate Anesthesia Transfer of Care Note  Patient: Kristen Dunn  Procedure(s) Performed: LAPAROSCOPIC OVARIAN CYSTECTOMY (Bilateral: Abdomen)  Patient Location: PACU  Anesthesia Type:General  Level of Consciousness: sedated, drowsy, and patient cooperative  Airway & Oxygen Therapy: Patient Spontanous Breathing and Patient connected to nasal cannula oxygen  Post-op Assessment: Report given to RN and Post -op Vital signs reviewed and stable  Post vital signs: Reviewed and stable  Last Vitals:  Vitals Value Taken Time  BP 122/84 03/12/22 0850  Temp    Pulse 76 03/12/22 0851  Resp 15 03/12/22 0851  SpO2 100 % 03/12/22 0851  Vitals shown include unvalidated device data.  Last Pain:  Vitals:   03/12/22 0643  TempSrc: Oral  PainSc:       Patients Stated Pain Goal: 2 (16/74/25 5258)  Complications: No notable events documented.

## 2022-03-14 LAB — SURGICAL PATHOLOGY

## 2022-03-14 NOTE — Anesthesia Postprocedure Evaluation (Signed)
Anesthesia Post Note  Patient: Kristen Dunn  Procedure(s) Performed: LAPAROSCOPIC OVARIAN CYSTECTOMY (Bilateral: Abdomen)  Patient location during evaluation: Phase II Anesthesia Type: General Level of consciousness: awake Pain management: pain level controlled Vital Signs Assessment: post-procedure vital signs reviewed and stable Respiratory status: spontaneous breathing and respiratory function stable Cardiovascular status: blood pressure returned to baseline and stable Postop Assessment: no headache and no apparent nausea or vomiting Anesthetic complications: no Comments: Late entry   No notable events documented.   Last Vitals:  Vitals:   03/12/22 0930 03/12/22 0943  BP: 101/69 (!) 106/91  Pulse: (!) 55 (!) 56  Resp: 17 12  Temp:  36.6 C  SpO2: 100% 100%    Last Pain:  Vitals:   03/12/22 0943  TempSrc: Oral  PainSc: [place]

## 2022-03-18 ENCOUNTER — Encounter: Payer: Self-pay | Admitting: Obstetrics & Gynecology

## 2022-03-18 ENCOUNTER — Ambulatory Visit (INDEPENDENT_AMBULATORY_CARE_PROVIDER_SITE_OTHER): Payer: BC Managed Care – PPO | Admitting: Obstetrics & Gynecology

## 2022-03-18 VITALS — BP 139/97 | HR 93

## 2022-03-18 DIAGNOSIS — Z9889 Other specified postprocedural states: Secondary | ICD-10-CM

## 2022-03-18 NOTE — Progress Notes (Signed)
  HPI: Patient returns for routine postoperative follow-up having undergone Lap LSO on 03/12/22.  The patient's immediate postoperative recovery has been unremarkable. Since hospital discharge the patient reports no problems.   Current Outpatient Medications: acetaminophen (TYLENOL) 500 MG tablet, Take 1,000 mg by mouth every 6 (six) hours as needed for moderate pain., Disp: , Rfl:  albuterol (VENTOLIN HFA) 108 (90 Base) MCG/ACT inhaler, Inhale 1-2 puffs into the lungs every 6 (six) hours as needed for wheezing or shortness of breath., Disp: 8 g, Rfl: 0 amitriptyline (ELAVIL) 25 MG tablet, Take 1 tablet (25 mg total) by mouth at bedtime., Disp: 30 tablet, Rfl: 2 BLISOVI FE 1/20 1-20 MG-MCG tablet, Take 1 tablet by mouth in the morning., Disp: 90 tablet, Rfl: 3 Cyanocobalamin (B-12 PO), Take 1 tablet by mouth daily., Disp: , Rfl:  gabapentin (NEURONTIN) 100 MG capsule, Take 1 capsule (100 mg total) by mouth at bedtime., Disp: 90 capsule, Rfl: 3 linaclotide (LINZESS) 145 MCG CAPS capsule, Take 1 capsule (145 mcg total) by mouth daily before breakfast. (Patient taking differently: Take 145 mcg by mouth every other day.), Disp: 90 capsule, Rfl: 1 MAGNESIUM PO, Take 1 tablet by mouth daily., Disp: , Rfl:  Riboflavin (B-2 PO), Take 1 tablet by mouth daily., Disp: , Rfl:  cyanocobalamin (VITAMIN B12) 1000 MCG/ML injection, Inject 1 ml 1000 mcg total into muscle once a month for three months, Disp: 1 mL, Rfl: 2 HYDROcodone-acetaminophen (NORCO/VICODIN) 5-325 MG tablet, Take 1 tablet by mouth every 6 (six) hours as needed. (Patient not taking: Reported on 03/18/2022), Disp: 15 tablet, Rfl: 0 ondansetron (ZOFRAN-ODT) 8 MG disintegrating tablet, Take 1 tablet (8 mg total) by mouth every 8 (eight) hours as needed for nausea or vomiting. (Patient not taking: Reported on 03/18/2022), Disp: 8 tablet, Rfl: 0 promethazine (PHENERGAN) 12.5 MG tablet, Take one po tid prn nausea with migraine (Patient not taking:  Reported on 03/04/2022), Disp: 20 tablet, Rfl: 0  No current facility-administered medications for this visit.    Blood pressure (!) 139/97, pulse 93.  Physical Exam: 3 incisions all look good  Diagnostic Tests:   Pathology: Benign simple ovarian cyst  Impression + Management plan:   ICD-10-CM   1. Post-operative state  Z98.890          Medications Prescribed this encounter: No orders of the defined types were placed in this encounter.     Follow up: No follow-ups on file.    Florian Buff, MD Attending Physician for the [facility] 03/18/2022 10:43 AM

## 2022-03-19 ENCOUNTER — Encounter (HOSPITAL_COMMUNITY): Payer: Self-pay | Admitting: Obstetrics & Gynecology

## 2022-04-30 ENCOUNTER — Encounter: Payer: Self-pay | Admitting: Family

## 2022-04-30 DIAGNOSIS — G43009 Migraine without aura, not intractable, without status migrainosus: Secondary | ICD-10-CM

## 2022-04-30 MED ORDER — AMITRIPTYLINE HCL 25 MG PO TABS: 25.0000 mg | ORAL_TABLET | Freq: Every day | ORAL | 2 refills | Status: DC

## 2022-06-10 NOTE — Progress Notes (Unsigned)
NEUROLOGY CONSULTATION NOTE  Kristen Dunn MRN: [id number] DOB: 02/04/1976  Referring provider: Eugenia Pancoast, FNP Primary care provider: Eugenia Pancoast, FNP  Reason for consult:  migraines  Assessment/Plan:   Chronic migraine without aura, without status migrainosus, intractable  Migraine prevention:  Increase amitriptyline to 76m at bedtime.  We can increase to 777min 6 weeks if needed; also on magnesium citrate, CoQ10 and riboflavin as well as continuous hormone therapy Migraine rescue:  She will try samples of Ubrelvy 10018m Zofran ODT 8mg11m needed for nausea. Limit use of pain relievers to no more than 2 days out of week to prevent risk of rebound or medication-overuse headache. Keep headache diary Follow up 4-5 months.    Subjective:  Kristen Dunn 46 y43r old right-handed female with asthma and history of gastric bypass who presents for migraines.  History supplemented by referring provider's notes.  Onset:  teenager Location:  starts back of neck and up to front of head, usually left side, sometimes right sided Quality:  throbbing Intensity:  usually 5/10, sometimes 7/10 Aura:  absent Prodrome:  absent Associated symptoms:  Photophobia, phonophobia, osmophobia.  Nausea if severe.  She denies associated visual disturbance, unilateral numbness or weakness. Duration:  On and off all day.  Usually wakes up with them.  Preceded by neck pain. Frequency:  occurs in clusters - daily for 2 weeks about every 2 weeks.  It was more frequent during fall of 2023.  Started on amitriptyline and abortive/analgesic cessation and they have improved.   Frequency of abortive medication: 4 days a week during migraine weeks. Triggers:  certain smells (perfumes), asthma attack, sleep pattern disturbance, seafood, wine, menstrual cycle Relieving factors:  massage, chiropractic therapy (stopped due to "loose joints), yoga Activity:  movement/activity aggravates   Past  NSAIDS/analgesics:  Fioricet, Excedrin Migraine, naproxen Past abortive triptans:  rizatriptan 10mg85mmatriptan tab/[place], eletriptan  Past abortive ergotamine:  none Past muscle relaxants:  Flexeril, Skelaxin - side effects Past anti-emetic:  none Past antihypertensive medications:  none Past antidepressant medications:  none Past anticonvulsant medications:  topiramate Past anti-CGRP:  none Past vitamins/Herbal/Supplements:  ferrous sulfate Past antihistamines/decongestants:  Benadryl, Zyrtec, Claritin Other past therapies:  none  Current NSAIDS/analgesics:  acetaminophen Current triptans:  none Current ergotamine:  none Current anti-emetic:   Zofran ODT 8mg C1ment muscle relaxants:  none Current Antihypertensive medications:  none Current Antidepressant medications:  amitriptyline 25mg a27mdtime Current Anticonvulsant medications:  gabapentin 100mg at10mtime Current anti-CGRP:  none Current Vitamins/Herbal/Supplements:  Mg, B2, CoQ10, B12 Current Antihistamines/Decongestants:  none Other therapy:  none Birth control:  BLISOVI FE continuous hormone therapy (for migraine prevention)   Caffeine:  limits to 2 cups of coffee a day.  Diet Pepsi once in awhile Diet:  Cut out artificial sweeteners (maybe Diet Pepsi once in awhile), 5 cups of water daily.  Does not skip meals Exercise:  Yoga Depression:  no; Anxiety:  no Other pain:  back pain Sleep hygiene:  improved with amitriptyline Family history of headache:  maternal aunt (migraines), niece (headaches)      PAST MEDICAL HISTORY: Past Medical History:  Diagnosis Date   AMA (advanced maternal age) multigravida 35+    Anemia    Asthma    Degenerative lumbar disc    Family history of adverse reaction to anesthesia    mother has nausea   Gestational diabetes    glyburide   Hx of migraines 10/26/2012   Other and unspecified ovarian cyst  03/30/2013   PONV (postoperative nausea and vomiting)     PAST SURGICAL  HISTORY: Past Surgical History:  Procedure Laterality Date   CESAREAN SECTION  01/13/2012   Procedure: CESAREAN SECTION;  Surgeon: Mora Bellman, MD;  Location: [facility];  Service: Obstetrics;  Laterality: N/A;   CESAREAN SECTION WITH BILATERAL TUBAL LIGATION Bilateral 07/30/2015   Procedure: REPEAT CESAREAN SECTION WITH BILATERAL TUBAL LIGATION, WIDE EXCISION OF CICHEIX;  Surgeon: Jonnie Kind, MD;  Location: [facility];  Service: Obstetrics;  Laterality: Bilateral;   COLONOSCOPY WITH PROPOFOL N/A 03/22/2021   Procedure: COLONOSCOPY WITH PROPOFOL;  Surgeon: Eloise Harman, DO;  Location: [facility];  Service: Endoscopy;  Laterality: N/A;  9:30 / ASA II   Gastric Bypass 2008     HERNIA REPAIR     INCISIONAL HERNIA REPAIR  03/31/2012   Procedure: LAPAROSCOPIC INCISIONAL HERNIA;  Surgeon: Edward Jolly, MD;  Location: [facility];  Service: General;  Laterality: N/A;  LAPAROSCOPY REPAIR INTERNAL HERNIA  petersons defect   LAPAROSCOPIC GASTRIC BANDING WITH HIATAL HERNIA REPAIR N/A 03/14/2013   Procedure: DIAGNOSTIC LAPAROSCOPY  WITH  INTERNAL HERNIA  REDUCTION WITH CLOSURE;  Surgeon: Shann Medal, MD;  Location: [facility];  Service: General;  Laterality: N/A;   LAPAROSCOPIC OVARIAN CYSTECTOMY Bilateral 03/12/2022   Procedure: LAPAROSCOPIC OVARIAN CYSTECTOMY;  Surgeon: Florian Buff, MD;  Location: [facility];  Service: Gynecology;  Laterality: Bilateral;   POLYPECTOMY  03/22/2021   Procedure: POLYPECTOMY;  Surgeon: Eloise Harman, DO;  Location: [facility];  Service: Endoscopy;;   TONSILLECTOMY      MEDICATIONS: Current Outpatient Medications on File Prior to Visit  Medication Sig Dispense Refill   acetaminophen (TYLENOL) 500 MG tablet Take 1,000 mg by mouth every 6 (six) hours as needed for moderate pain.     albuterol (VENTOLIN HFA) 108 (90 Base) MCG/ACT inhaler Inhale 1-2 puffs into the lungs every 6 (six) hours as needed for wheezing or shortness of breath. 8 g 0   amitriptyline  (ELAVIL) 25 MG tablet Take 1 tablet (25 mg total) by mouth at bedtime. 90 tablet 2   BLISOVI FE 1/20 1-20 MG-MCG tablet Take 1 tablet by mouth in the morning. 90 tablet 3   Cyanocobalamin (B-12 PO) Take 1 tablet by mouth daily.     cyanocobalamin (VITAMIN B12) 1000 MCG/ML injection Inject 1 ml 1000 mcg total into muscle once a month for three months 1 mL 2   gabapentin (NEURONTIN) 100 MG capsule Take 1 capsule (100 mg total) by mouth at bedtime. 90 capsule 3   MAGNESIUM PO Take 1 tablet by mouth daily.     Riboflavin (B-2 PO) Take 1 tablet by mouth daily.     No current facility-administered medications on file prior to visit.    ALLERGIES: Allergies  Allergen Reactions   Penicillins Anaphylaxis    Reaction occurred as a very small child; anaphylaxis likely but not completely certain Has patient had a PCN reaction causing immediate rash, facial/tongue/throat swelling, SOB or lightheadedness with hypotension: Yes Has patient had a PCN reaction causing severe rash involving mucus membranes or skin necrosis: No Has patient had a PCN reaction that required hospitalization No Has patient had a PCN reaction occurring within the last 10 years: No If all of the above answers are "NO", then may procee   Aspirin     Intolerance because of gastric bypass surgery Asthma flares up   Fioricet [Butalbital-Apap-Caffeine]     Didn't work for patient  Alupent [Metaproterenol] Palpitations and Other (See Comments)    hyperadrenergic tremor   Lactose Intolerance (Gi) Diarrhea   Latex Itching and Dermatitis    FAMILY HISTORY: Family History  Problem Relation Age of Onset   COPD Mother    Asthma Mother    Skin cancer Mother    Hypertension Father    Aneurysm Father        died age 9   Asthma Sister    Thyroid disease Maternal Grandmother    Stroke Maternal Grandmother    Breast cancer Maternal Aunt    Von Willebrand disease Cousin     Objective:  Blood pressure 138/78, pulse 90, height  5' 5"$  (1.651 m), weight 201 lb 6.4 oz (91.4 kg), SpO2 100 %. General: No acute distress.  Patient appears well-groomed.   Head:  Normocephalic/atraumatic Eyes:  fundi examined but not visualized Neck: supple, no paraspinal tenderness, full range of motion Back: No paraspinal tenderness Heart: regular rate and rhythm Lungs: Clear to auscultation bilaterally. Vascular: No carotid bruits. Neurological Exam: Mental status: alert and oriented to person, place, and time, speech fluent and not dysarthric, language intact. Cranial nerves: CN I: not tested CN II: pupils equal, round and reactive to light, visual fields intact CN III, IV, VI:  full range of motion, no nystagmus, no ptosis CN V: facial sensation intact. CN VII: upper and lower face symmetric CN VIII: hearing intact CN IX, X: gag intact, uvula midline CN XI: sternocleidomastoid and trapezius muscles intact CN XII: tongue midline Bulk & Tone: normal, no fasciculations. Motor:  muscle strength 5/5 throughout Sensation:  Temperature and vibratory sensation intact. Deep Tendon Reflexes:  2+ throughout,  toes downgoing.   Finger to nose testing:  Without dysmetria.   Heel to shin:  Without dysmetria.   Gait:  Normal station and stride.  Romberg negative.    Thank you for allowing me to take part in the care of this patient.  Metta Clines, DO  CC: Eugenia Pancoast, FNP

## 2022-06-11 ENCOUNTER — Encounter: Payer: Self-pay | Admitting: Neurology

## 2022-06-11 ENCOUNTER — Ambulatory Visit (INDEPENDENT_AMBULATORY_CARE_PROVIDER_SITE_OTHER): Payer: BC Managed Care – PPO | Admitting: Neurology

## 2022-06-11 VITALS — BP 138/78 | HR 90 | Ht 65.0 in | Wt 201.4 lb

## 2022-06-11 DIAGNOSIS — G43719 Chronic migraine without aura, intractable, without status migrainosus: Secondary | ICD-10-CM | POA: Diagnosis not present

## 2022-06-11 MED ORDER — AMITRIPTYLINE HCL 50 MG PO TABS: 50.0000 mg | ORAL_TABLET | Freq: Every day | ORAL | 5 refills | Status: DC

## 2022-06-11 NOTE — Progress Notes (Signed)
Medication Samples have been provided to the patient.  Drug name: Roselyn Meier       Strength: 100 mg        Qty: 4  LOT: [id number]  Exp.Date: 12/2023  Dosing instructions: as needed  The patient has been instructed regarding the correct time, dose, and frequency of taking this medication, including desired effects and most common side effects.   Venetia Night 8:30 AM 06/11/2022

## 2022-06-11 NOTE — Patient Instructions (Addendum)
  Increase amitriptyline to 42m at bedtime.  Contact u[place]ain 6 weeks with update and we can increase dose if needed. Take Ubrelvy at earliest onset of headache.  May repeat dose once in 2 hours if needed.  Maximum 2 tablets in 24 hours.  Let me know if it works. Limit use of pain relievers to no more than 2 days out of the week.  These medications include acetaminophen, NSAIDs (ibuprofen/Advil/Motrin, naproxen/Aleve, triptans (Imitrex/sumatriptan), Excedrin, and narcotics.  This will help reduce risk of rebound headaches.  Routine exercise Stay adequately hydrated (aim for 64 oz water daily) Keep headache diary Maintain proper stress management Maintain proper sleep hygiene Do not skip meals Consider supplements:  magnesium citrate 4075mdaily, riboflavin 40066maily, coenzyme Q10 300m54mily. Follow up 4 to 5 months.

## 2022-06-18 ENCOUNTER — Encounter: Payer: Self-pay | Admitting: Neurology

## 2022-07-31 DIAGNOSIS — R7303 Prediabetes: Secondary | ICD-10-CM | POA: Insufficient documentation

## 2022-08-01 ENCOUNTER — Ambulatory Visit: Payer: BC Managed Care – PPO | Admitting: Family

## 2022-08-12 DIAGNOSIS — M7582 Other shoulder lesions, left shoulder: Secondary | ICD-10-CM | POA: Diagnosis not present

## 2022-09-09 ENCOUNTER — Encounter: Payer: Self-pay | Admitting: Neurology

## 2022-10-13 ENCOUNTER — Other Ambulatory Visit: Payer: Self-pay | Admitting: Adult Health

## 2022-10-27 ENCOUNTER — Other Ambulatory Visit: Payer: Self-pay | Admitting: Adult Health

## 2022-10-27 MED ORDER — BLISOVI FE 1/20 1-20 MG-MCG PO TABS: 1.0000 | ORAL_TABLET | Freq: Every morning | ORAL | 3 refills | Status: DC

## 2022-10-27 NOTE — Progress Notes (Signed)
Refilled blisovi 

## 2022-11-05 NOTE — Progress Notes (Signed)
NEUROLOGY FOLLOW UP OFFICE NOTE  Kristen Dunn [id number]  Assessment/Plan:   Migraine without aura, with status migrainosus improved Probable left sided trigeminal neuralgia   Migraine prevention:  Amitriptyline 50mg  at bedtime; also on magnesium citrate, CoQ10 and riboflavin as well as continuous hormone therapy Migraine rescue:  Send prescription for Zavzpret NS.  It is effective.  Has not responded to several triptans, Ubrelvy or Nurtec.  With history of gastric bypass, a non-oral medication may be more effective due to possible decreased absorption in the gut.  Zofran ODT 8mg  if needed for nausea. For trigeminal neuralgia:  Taking gabapentin 100mg  at bedtime for RLS.  Increase to 100mg  three times daily.  We can increase dose if needed. Limit use of pain relievers to no more than 2 days out of week to prevent risk of rebound or medication-overuse headache. Keep headache diary Follow up 6 months.       Subjective:  Kristen Dunn is a 47 year old right-handed female with asthma and history of gastric bypass who follows up for migraines.  UPDATE: Increased amitriptyline from 25mg  to 50mg  at bedtime.  Improved.  Did not respond to Ubrelvy or Nurtec for rescue. Intensity:  5/10 Duration:  persistent for one week but Zavzpret broke.   Frequency:  1 cluster in 5 months, for about a week  She has been experiencing lower left sided facial pain.  She notes a shooting pain from her mouth radiating up the left jaw.  It may be triggered by talking.  If she touches her left nasolabial fold, she may also elicit a shooting pain up the left side of her face.  She saw a dentist who noted multiple teeth with fillings that may be touching a nerve but no active infection/inflammation and pain not reproduced with palpation of the teeth.     Current NSAIDS/analgesics:  acetaminophen Current triptans:  none Current ergotamine:  none Current anti-emetic:   Zofran ODT 8mg  Current muscle relaxants:   none Current Antihypertensive medications:  none Current Antidepressant medications:  amitriptyline 50mg  at bedtime Current Anticonvulsant medications:  gabapentin 100mg  at bedtime Current anti-CGRP:  Zavzpret NS (samples) Current Vitamins/Herbal/Supplements:  Mg, B2, CoQ10, B12 Current Antihistamines/Decongestants:  none Other therapy:  none Birth control:  BLISOVI FE continuous hormone therapy (for migraine prevention)     Caffeine:  limits to 2 cups of coffee a day.  Diet Pepsi once in awhile Diet:  Cut out artificial sweeteners (maybe Diet Pepsi once in awhile), 5 cups of water daily.  Does not skip meals Exercise:  Yoga Depression:  no; Anxiety:  no Other pain:  back pain Sleep hygiene:  improved with amitriptyline  HISTORY:  Onset:  teenager Location:  starts back of neck and up to front of head, usually left side, sometimes right sided Quality:  throbbing Intensity:  usually 5/10, sometimes 7/10 Aura:  absent Prodrome:  absent Associated symptoms:  Photophobia, phonophobia, osmophobia.  Nausea if severe.  She denies associated visual disturbance, unilateral numbness or weakness. Duration:  On and off all day.  Usually wakes up with them.  Preceded by neck pain. Frequency:  occurs in clusters - daily for 2 weeks about every 2 weeks.  It was more frequent during fall of 2023.  Started on amitriptyline and abortive/analgesic cessation and they have improved.   Frequency of abortive medication: 4 days a week during migraine weeks. Triggers:  certain smells (perfumes), asthma attack, sleep pattern disturbance, seafood, wine, menstrual cycle Relieving factors:  massage,  chiropractic therapy (stopped due to "loose joints), yoga Activity:  movement/activity aggravates     Past NSAIDS/analgesics:  Fioricet, Excedrin Migraine, naproxen Past abortive triptans:  rizatriptan 10mg , sumatriptan tab/[place], eletriptan  Past abortive ergotamine:  none Past muscle relaxants:  Flexeril,  Skelaxin - side effects Past anti-emetic:  none Past antihypertensive medications:  none Past antidepressant medications:  none Past anticonvulsant medications:  topiramate Past anti-CGRP:  Bernita Raisin, Nurtec Past vitamins/Herbal/Supplements:  ferrous sulfate Past antihistamines/decongestants:  Benadryl, Zyrtec, Claritin Other past therapies:  none    Family history of headache:  maternal aunt (migraines), niece (headaches)    PAST MEDICAL HISTORY: Past Medical History:  Diagnosis Date   AMA (advanced maternal age) multigravida 35+    Anemia    Asthma    Degenerative lumbar disc    Family history of adverse reaction to anesthesia    mother has nausea   Gestational diabetes    glyburide   Hx of migraines 10/26/2012   Other and unspecified ovarian cyst 03/30/2013   PONV (postoperative nausea and vomiting)     MEDICATIONS: Current Outpatient Medications on File Prior to Visit  Medication Sig Dispense Refill   acetaminophen (TYLENOL) 500 MG tablet Take 1,000 mg by mouth every 6 (six) hours as needed for moderate pain.     albuterol (VENTOLIN HFA) 108 (90 Base) MCG/ACT inhaler Inhale 1-2 puffs into the lungs every 6 (six) hours as needed for wheezing or shortness of breath. 8 g 0   amitriptyline (ELAVIL) 50 MG tablet Take 1 tablet (50 mg total) by mouth at bedtime. 30 tablet 5   BLISOVI FE 1/20 1-20 MG-MCG tablet Take 1 tablet by mouth in the morning. Do not take palcebos 90 tablet 3   Cyanocobalamin (B-12 PO) Take 1 tablet by mouth daily.     gabapentin (NEURONTIN) 100 MG capsule Take 1 capsule (100 mg total) by mouth at bedtime. 90 capsule 3   MAGNESIUM PO Take 1 tablet by mouth daily.     Riboflavin (B-2 PO) Take 1 tablet by mouth daily.     No current facility-administered medications on file prior to visit.    ALLERGIES: Allergies  Allergen Reactions   Penicillins Anaphylaxis    Reaction occurred as a very small child; anaphylaxis likely but not completely certain Has  patient had a PCN reaction causing immediate rash, facial/tongue/throat swelling, SOB or lightheadedness with hypotension: Yes Has patient had a PCN reaction causing severe rash involving mucus membranes or skin necrosis: No Has patient had a PCN reaction that required hospitalization No Has patient had a PCN reaction occurring within the last 10 years: No If all of the above answers are "NO", then may procee   Aspirin     Intolerance because of gastric bypass surgery Asthma flares up   Alupent [Metaproterenol] Palpitations and Other (See Comments)    hyperadrenergic tremor   Lactose Intolerance (Gi) Diarrhea   Latex Itching and Dermatitis    FAMILY HISTORY: Family History  Problem Relation Age of Onset   COPD Mother    Asthma Mother    Skin cancer Mother    Hypertension Father    Aneurysm Father        died age 44   Asthma Sister    Migraines Maternal Aunt    Breast cancer Maternal Aunt    Parkinsonism Maternal Grandmother    Thyroid disease Maternal Grandmother    Stroke Maternal Grandmother    Von Willebrand disease Cousin       Objective:  Blood pressure 121/85, pulse 99, height 5\' 8"  (1.727 m), weight 209 lb 3.2 oz (94.9 kg), SpO2 98%. General: No acute distress.  Patient appears well-groomed.      Shon Millet, DO  CC: Mort Sawyers, FNP

## 2022-11-10 ENCOUNTER — Encounter: Payer: Self-pay | Admitting: Neurology

## 2022-11-10 ENCOUNTER — Ambulatory Visit (INDEPENDENT_AMBULATORY_CARE_PROVIDER_SITE_OTHER): Payer: BC Managed Care – PPO | Admitting: Neurology

## 2022-11-10 ENCOUNTER — Other Ambulatory Visit (HOSPITAL_COMMUNITY): Payer: Self-pay

## 2022-11-10 ENCOUNTER — Telehealth: Payer: Self-pay | Admitting: Pharmacy Technician

## 2022-11-10 VITALS — BP 121/85 | HR 99 | Ht 68.0 in | Wt 209.2 lb

## 2022-11-10 DIAGNOSIS — G500 Trigeminal neuralgia: Secondary | ICD-10-CM | POA: Diagnosis not present

## 2022-11-10 DIAGNOSIS — G43011 Migraine without aura, intractable, with status migrainosus: Secondary | ICD-10-CM | POA: Diagnosis not present

## 2022-11-10 MED ORDER — ZAVZPRET 10 MG/ACT NA SOLN: 1.0000 | Freq: Every day | NASAL | 11 refills | Status: AC | PRN

## 2022-11-10 MED ORDER — GABAPENTIN 100 MG PO CAPS: 100.0000 mg | ORAL_CAPSULE | Freq: Three times a day (TID) | ORAL | 5 refills | Status: DC

## 2022-11-10 MED ORDER — AMITRIPTYLINE HCL 50 MG PO TABS: 50.0000 mg | ORAL_TABLET | Freq: Every day | ORAL | 5 refills | Status: DC

## 2022-11-10 NOTE — Patient Instructions (Signed)
Continue amitriptyline 50mg  at bedtime for migraine prevention Take Zavzpret nasal spray as needed for migraine attack Increase gabapentin to 100mg  three times daily for facial pain.  We can increase dose if needed Follow up 6 months.

## 2022-11-10 NOTE — Telephone Encounter (Signed)
Pharmacy Patient Advocate Encounter   Received notification from CoverMyMeds that prior authorization for ZAVZPRET 10MG  is required/requested.   Insurance verification completed.   The patient is insured through [employer] [facility] .   Per test claim: PA submitted to [employer] [facility] via CoverMyMeds Key/confirmation #/[facility] Status is pending

## 2022-11-10 NOTE — Telephone Encounter (Signed)
Pharmacy Patient Advocate Encounter  Received notification from [employer] [facility] that Prior Authorization for Zavzpret 10MG /ACT solution has been APPROVED from 11-10-2022 to 11-09-2023.Marland Kitchen  PA #/Case ID/Reference #: [id number]  Co-pay is $500.82 per 30 day supply. Quantity approved 6 per 30 day

## 2022-11-20 ENCOUNTER — Other Ambulatory Visit: Payer: Self-pay

## 2022-11-20 ENCOUNTER — Encounter: Payer: Self-pay | Admitting: Neurology

## 2022-11-20 DIAGNOSIS — G500 Trigeminal neuralgia: Secondary | ICD-10-CM

## 2022-12-04 ENCOUNTER — Encounter (INDEPENDENT_AMBULATORY_CARE_PROVIDER_SITE_OTHER): Payer: Self-pay

## 2022-12-27 ENCOUNTER — Ambulatory Visit
Admission: RE | Admit: 2022-12-27 | Discharge: 2022-12-27 | Disposition: A | Discharge status: Home / Self Care | Payer: BC Managed Care – PPO | Source: Ambulatory Visit | Attending: Neurology | Admitting: Neurology

## 2022-12-27 DIAGNOSIS — G500 Trigeminal neuralgia: Secondary | ICD-10-CM | POA: Diagnosis not present

## 2022-12-27 MED ORDER — GADOPICLENOL 0.5 MMOL/ML IV SOLN
9.0000 mL | Freq: Once | INTRAVENOUS | Status: AC | PRN
  Administered 2022-12-27: 9 mL via INTRAVENOUS

## 2023-01-06 ENCOUNTER — Encounter: Payer: Self-pay | Admitting: Neurology

## 2023-01-15 NOTE — Progress Notes (Signed)
Patient advised.

## 2023-01-19 ENCOUNTER — Ambulatory Visit (INDEPENDENT_AMBULATORY_CARE_PROVIDER_SITE_OTHER): Payer: BC Managed Care – PPO | Admitting: Family

## 2023-01-19 ENCOUNTER — Encounter: Payer: Self-pay | Admitting: Family

## 2023-01-19 VITALS — BP 134/86 | HR 94 | Temp 97.7°F | Ht 65.0 in | Wt 207.8 lb

## 2023-01-19 DIAGNOSIS — E538 Deficiency of other specified B group vitamins: Secondary | ICD-10-CM

## 2023-01-19 DIAGNOSIS — Z0000 Encounter for general adult medical examination without abnormal findings: Secondary | ICD-10-CM

## 2023-01-19 DIAGNOSIS — R7303 Prediabetes: Secondary | ICD-10-CM

## 2023-01-19 DIAGNOSIS — G43009 Migraine without aura, not intractable, without status migrainosus: Secondary | ICD-10-CM | POA: Diagnosis not present

## 2023-01-19 DIAGNOSIS — D500 Iron deficiency anemia secondary to blood loss (chronic): Secondary | ICD-10-CM | POA: Diagnosis not present

## 2023-01-19 DIAGNOSIS — E7800 Pure hypercholesterolemia, unspecified: Secondary | ICD-10-CM

## 2023-01-19 DIAGNOSIS — Z1231 Encounter for screening mammogram for malignant neoplasm of breast: Secondary | ICD-10-CM

## 2023-01-19 DIAGNOSIS — N951 Menopausal and female climacteric states: Secondary | ICD-10-CM

## 2023-01-19 DIAGNOSIS — Z8742 Personal history of other diseases of the female genital tract: Secondary | ICD-10-CM

## 2023-01-19 DIAGNOSIS — K5904 Chronic idiopathic constipation: Secondary | ICD-10-CM

## 2023-01-19 DIAGNOSIS — G500 Trigeminal neuralgia: Secondary | ICD-10-CM

## 2023-01-19 LAB — BASIC METABOLIC PANEL
BUN: 12 mg/dL (ref 6–23)
CO2: 28 meq/L (ref 19–32)
Calcium: 9.8 mg/dL (ref 8.4–10.5)
Chloride: 100 meq/L (ref 96–112)
Creatinine, Ser: 0.86 mg/dL (ref 0.40–1.20)
GFR: 79.88 mL/min (ref >60.00)
Glucose, Bld: 95 mg/dL (ref 70–99)
Potassium: 4.8 meq/L (ref 3.5–5.1)
Sodium: 138 meq/L (ref 135–145)

## 2023-01-19 LAB — CBC
HCT: 41.7 % (ref 36.0–46.0)
Hemoglobin: 13.5 g/dL (ref 12.0–15.0)
MCHC: 32.3 g/dL (ref 30.0–36.0)
MCV: 91.8 fL (ref 78.0–100.0)
Platelets: 442 10*3/uL — ABNORMAL HIGH (ref 150.0–400.0)
RBC: 4.54 Mil/uL (ref 3.87–5.11)
RDW: 13.7 % (ref 11.5–15.5)
WBC: 5.7 10*3/uL (ref 4.0–10.5)

## 2023-01-19 LAB — IBC + FERRITIN
Ferritin: 5.5 ng/mL — ABNORMAL LOW (ref 10.0–291.0)
Iron: 81 ug/dL (ref 42–145)
Saturation Ratios: 14.3 % — ABNORMAL LOW (ref 20.0–50.0)
TIBC: 565.6 ug/dL — ABNORMAL HIGH (ref 250.0–450.0)
Transferrin: 404 mg/dL — ABNORMAL HIGH (ref 212.0–360.0)

## 2023-01-19 LAB — LIPID PANEL
Cholesterol: 259 mg/dL — ABNORMAL HIGH (ref 0–200)
HDL: 93 mg/dL (ref >39.00)
LDL Cholesterol: 143 mg/dL — ABNORMAL HIGH (ref 0–99)
NonHDL: 165.53
Total CHOL/HDL Ratio: 3
Triglycerides: 111 mg/dL (ref 0.0–149.0)
VLDL: 22.2 mg/dL (ref 0.0–40.0)

## 2023-01-19 LAB — HEMOGLOBIN A1C: Hgb A1c MFr Bld: 5.8 % (ref 4.6–6.5)

## 2023-01-19 LAB — VITAMIN B12: Vitamin B-12: 486 pg/mL (ref 211–911)

## 2023-01-19 MED ORDER — TRULANCE 3 MG PO TABS: 3.0000 mg | ORAL_TABLET | Freq: Every day | ORAL | 0 refills | Status: DC

## 2023-01-19 NOTE — Assessment & Plan Note (Signed)
Ordered lipid panel, pending results. Work on low cholesterol diet and exercise as tolerated  

## 2023-01-19 NOTE — Assessment & Plan Note (Signed)
Trial trulance 3 mg once daily  Linzess not approved but also checking with pharmacist to see if any other coverage available.

## 2023-01-19 NOTE — Assessment & Plan Note (Signed)
Improving.  Cont f/u with neurology as scheduled and medications as prescribed.

## 2023-01-19 NOTE — Assessment & Plan Note (Signed)
Improving. Cont f/u with neurology as scheduled. Reviewed MRI

## 2023-01-19 NOTE — Progress Notes (Signed)
Subjective:  Patient ID: Kristen Dunn, female    DOB: [birth year]  Age: 47 y.o. MRN: [id number]  Patient Care Team: Kristen Sawyers, FNP as PCP - General (Family Medicine) Kristen Ade, PA-C as Physician Assistant (Dermatology) Kristen Dallas, DO as Consulting Physician (Neurology)   CC:  Chief Complaint  Patient presents with   Annual Exam    HPI Kristen Dunn is a 47 y.o. female who presents today for an annual physical exam. She reports consuming a general diet. The patient does not participate in regular exercise at present. She generally feels well. She reports sleeping fairly well. She does not have additional problems to discuss today.   Vision and dental utd , regular screenings  Mammogram: 02/05/22, normal  Last pap: 12/2020 negative. Kristen Dunn is her GYN Colonoscopy: 03/22/2021, polyp, benign, repeat five years Declines flu vaccination.   Pt is with acute concerns.  Pt with chronic idiopathic constipation, linzess worked really well but then unable to get approved with insurance. She is still only having bowel movements every two to three days, takes miralax as needed.   Migraine without aura, followed by neurology much improved however was dealing with trigeminal neuralgia. MRI normal no acute findings. On amitriptyline 50 mg at bedtime also taking mag citrate, co q 10 and riboflavin. Migraine abruptor zavzpret NS. Still taking gabapentin 100 mg at bedtime , now doing every other day to try to wean off. She states the trig neuralgia pain has completed resolved  Advanced Directives Patient does not have advanced directives   DEPRESSION SCREENING    01/19/2023   10:50 AM 01/10/2022    9:44 AM 08/21/2021    4:03 PM 01/02/2021    3:58 PM 01/02/2020    3:32 PM 10/27/2017    4:14 PM  PHQ 2/9 Scores  PHQ - 2 Score 0 0 0 0 0 0  PHQ- 9 Score 2 2 1 1 1       ROS: Negative unless specifically indicated above in HPI.    Current Outpatient Medications:     acetaminophen (TYLENOL) 500 MG tablet, Take 1,000 mg by mouth every 6 (six) hours as needed for moderate pain., Disp: , Rfl:    albuterol (VENTOLIN HFA) 108 (90 Base) MCG/ACT inhaler, Inhale 1-2 puffs into the lungs every 6 (six) hours as needed for wheezing or shortness of breath., Disp: 8 g, Rfl: 0   amitriptyline (ELAVIL) 50 MG tablet, Take 1 tablet (50 mg total) by mouth at bedtime., Disp: 30 tablet, Rfl: 5   BLISOVI FE 1/20 1-20 MG-MCG tablet, Take 1 tablet by mouth in the morning. Do not take palcebos, Disp: 90 tablet, Rfl: 3   Cyanocobalamin (B-12 PO), Take 1 tablet by mouth daily., Disp: , Rfl:    gabapentin (NEURONTIN) 100 MG capsule, Take 1 capsule (100 mg total) by mouth 3 (three) times daily., Disp: 90 capsule, Rfl: 5   MAGNESIUM PO, Take 1 tablet by mouth daily., Disp: , Rfl:    Plecanatide (TRULANCE) 3 MG TABS, Take 1 tablet (3 mg total) by mouth daily., Disp: 90 tablet, Rfl: 0   Riboflavin (B-2 PO), Take 1 tablet by mouth daily., Disp: , Rfl:    Zavegepant HCl (ZAVZPRET) 10 MG/ACT SOLN, Place 1 spray into the nose daily as needed., Disp: 6 each, Rfl: 11    Objective:    BP 134/86 (BP Location: Left Arm, Patient Position: Sitting, Cuff Size: Normal)   Pulse 94   Temp 97.7 F (36.5 C) (  Temporal)   Ht 5\' 5"  (1.651 m)   Wt 207 lb 12.8 oz (94.3 kg)   SpO2 100%   BMI 34.58 kg/m   BP Readings from Last 3 Encounters:  01/19/23 134/86  11/10/22 121/85  06/11/22 138/78      Physical Exam Constitutional:      General: She is not in acute distress.    Appearance: Normal appearance. She is normal weight. She is not ill-appearing.  HENT:     Head: Normocephalic.     Right Ear: Tympanic membrane normal.     Left Ear: Tympanic membrane normal.     Nose: Nose normal.     Mouth/Throat:     Mouth: Mucous membranes are moist.  Eyes:     Extraocular Movements: Extraocular movements intact.     Pupils: Pupils are equal, round, and reactive to light.  Cardiovascular:     Rate  and Rhythm: Normal rate and regular rhythm.  Pulmonary:     Effort: Pulmonary effort is normal.     Breath sounds: Normal breath sounds.  Abdominal:     General: Abdomen is flat. Bowel sounds are normal.     Palpations: Abdomen is soft.     Tenderness: There is no guarding or rebound.  Musculoskeletal:        General: Normal range of motion.     Cervical back: Normal range of motion.  Skin:    General: Skin is warm.     Capillary Refill: Capillary refill takes less than 2 seconds.  Neurological:     General: No focal deficit present.     Mental Status: She is alert.  Psychiatric:        Mood and Affect: Mood normal.        Behavior: Behavior normal.        Thought Content: Thought content normal.        Judgment: Judgment normal.          Assessment & Plan:  Screening mammogram for breast cancer -     3D Screening Mammogram, Left and Right; Future  Elevated LDL cholesterol level Assessment & Plan: Ordered lipid panel, pending results. Work on low cholesterol diet and exercise as tolerated   Orders: -     Lipid panel  Perimenopause  Migraine without aura and without status migrainosus, not intractable Assessment & Plan: Improving.  Cont f/u with neurology as scheduled and medications as prescribed.    Iron deficiency anemia due to chronic blood loss Assessment & Plan: Cbc ibc ferritin pending results and ordered.   Orders: -     Vitamin B12  Prediabetes Assessment & Plan: Pt advised of the following: Work on a diabetic diet, try to incorporate exercise at least 20-30 a day for 3 days a week or more.     Orders: -     Hemoglobin A1c  History of ovarian cyst  Trigeminal neuralgia Assessment & Plan: Improving. Cont f/u with neurology as scheduled. Reviewed MRI    Encounter for general adult medical examination without abnormal findings Assessment & Plan: Patient Counseling(The following topics were reviewed):  Preventative care handout given to  pt  Health maintenance and immunizations reviewed. Please refer to Health maintenance section. Pt advised on safe sex, wearing seatbelts in car, and proper nutrition labwork ordered today for annual Dental health: Discussed importance of regular tooth brushing, flossing, and dental visits. Declines flu vaccination   Orders: -     CBC -     IBC +  Ferritin -     Lipid panel -     Basic metabolic panel  Low serum vitamin B12 Assessment & Plan: Ordered b12 pending results   Orders: -     Vitamin B12  Chronic idiopathic constipation Assessment & Plan: Trial trulance 3 mg once daily  Linzess not approved but also checking with pharmacist to see if any other coverage available.   Orders: -     AMB Referral to Pharmacy Medication Management -     Trulance; Take 1 tablet (3 mg total) by mouth daily.  Dispense: 90 tablet; Refill: 0      Follow-up: Return in about 1 year (around 01/19/2024) for f/u CPE.   Kristen Sawyers, FNP

## 2023-01-19 NOTE — Assessment & Plan Note (Signed)
Ordered b12 pending results  

## 2023-01-19 NOTE — Assessment & Plan Note (Signed)
Cbc ibc ferritin pending results and ordered.

## 2023-01-19 NOTE — Assessment & Plan Note (Signed)
Pt advised of the following: Work on a diabetic diet, try to incorporate exercise at least 20-30 a day for 3 days a week or more.   

## 2023-01-19 NOTE — Patient Instructions (Signed)
  I have sent an electronic order over to your preferred location for the following:   []   2D Mammogram  [x]   3D Mammogram  []   Bone Density   Please give this center a call to get scheduled at your convenience.   [x]   [facility]      [address] [place] [address], [place]        [phone]         Make sure to wear two piece  clothing  No lotions powders or deodorants the day of the appointment Make sure to bring picture ID and insurance card.  Bring list of medications you are currently taking including any supplements.   ------------------------------------ Stop by the lab prior to leaving today. I will notify you of your results once received.    Regards,   Mort Sawyers FNP-C

## 2023-01-19 NOTE — Assessment & Plan Note (Signed)
Patient Counseling(The following topics were reviewed):  Preventative care handout given to pt  Health maintenance and immunizations reviewed. Please refer to Health maintenance section. Pt advised on safe sex, wearing seatbelts in car, and proper nutrition labwork ordered today for annual Dental health: Discussed importance of regular tooth brushing, flossing, and dental visits.  Declines flu vaccination

## 2023-01-20 ENCOUNTER — Telehealth: Payer: Self-pay

## 2023-01-20 ENCOUNTER — Other Ambulatory Visit: Payer: Self-pay | Admitting: Family

## 2023-01-20 DIAGNOSIS — D500 Iron deficiency anemia secondary to blood loss (chronic): Secondary | ICD-10-CM

## 2023-01-20 DIAGNOSIS — E7800 Pure hypercholesterolemia, unspecified: Secondary | ICD-10-CM

## 2023-01-20 NOTE — Progress Notes (Signed)
   Care Guide Note  01/20/2023 Name: Kristen Dunn MRN: [id number] DOB: 12/25/1975  Referred by: Mort Sawyers, FNP Reason for referral : Care Coordination (Outreach to schedule with Pharm d )   Kristen Dunn is a 47 y.o. year old female who is a primary care patient of Mort Sawyers, FNP. Kristen Dunn was referred to the pharmacist for assistance related to  med assistance  .    Successful contact was made with the patient to discuss pharmacy services including being ready for the pharmacist to call at least 5 minutes before the scheduled appointment time, to have medication bottles and any blood sugar or blood pressure readings ready for review. The patient agreed to meet with the pharmacist via with the pharmacist via telephone visit on (date/time).  01/22/2023  Kristen Dunn, RMA Care Guide [facility]  [place], [place] [address] Direct Dial: [phone] [email or web address]

## 2023-01-22 ENCOUNTER — Other Ambulatory Visit: Payer: BC Managed Care – PPO

## 2023-01-27 ENCOUNTER — Other Ambulatory Visit: Payer: BC Managed Care – PPO

## 2023-01-27 NOTE — Progress Notes (Unsigned)
   01/27/2023 Name: Kristen Dunn MRN: [id number] DOB: January 09, 1976  Subjective  No chief complaint on file.   Reason for visit: Kristen Dunn is a 47 y.o. year old female who presented for a telephone visit.   They were referred to the pharmacist by their PCP for assistance in managing medication access.   Care Team: Primary Care Provider: Mort Sawyers, FNP  Reason for visit: ?  Kristen Dunn is a 47 y.o. female who presents today for a phone visit with the pharmacist due to medication access concerns regarding their Linzess. ?Linzess not covered by insurance. Referral to discuss coupons or alternative options.    Medication Access: ?  Prescription drug coverage: Payor: BLUE CROSS BLUE SHIELD / Plan: BCBS COMM PPO / Product Type: *No Product type* / .   Reports that all medications are not affordable.   Patient was on linzess last year which worked really well. Insurance did cover Linzess, but copay was high, around $300/month. She purchased a couple of refills though feels this cost is no longer feasible.   Patient reports that her copay for Trulance is similar to that of Linzess, $330/month so she did not pick up this new prescription.   Current Patient Assistance: None - GI office did give her a coupon to reduce cost in the past, but [facility] was having trouble running the coupon.   AbbVie Copay Card (Linzess): - [employer] only, maximum annual benefit that may be available solely for the patient's benefit under the copay assistance program is $2,280.00 per calendar year. - Patient reports she is already enrolled: BIN [id number], PCN [id number], GRP [id number], ID [id number]   Copay Card (Trulance): - [employer] only, maximum annual benefit is present, though not reported on coupon or medication website.  TO PHARMACIST: When you apply this offer, you are certifying that you have not submitted a claim for reimbursement  under any Government Program for this  prescription, or where prohibited by law. Participation in this program must  comply with all applicable laws and regulations as a pharmacy provider. By participating in this program, you are  certifying that you will comply with the eligibility criteria, and terms and conditions described herein. You also certify  that you will not seek reimbursement for any benefit received through this card. Process a Coordination of Benefits  (COB/split bill) claim using your patient's prescription insurance for the PRIMARY claim. Then submit the balance due as a  SECONDARY claim to PDM under BIN: [id number].   Preferred email for coupon enrollment as needed: [email or web address]    Assessment and Plan:   1. Medication Access Linzess and Trulance are covered by patient's insurance, though copay of ~$300 is cost prohibitive at this time. Both programs have a copay card in which patient should be eligible to use. Previously, [facility] was unable to bill the copay card, though suspect this was user error rather than ineffective copay card.  Called [facility] and provided Trulance copay card information.  [facility] staff reports they are 'busy' and will call me back once they have successfully or unsuccessfully run the copay card. Phone call not returned, will follow up with pharmacy tomorrow.   Future Appointments  Date Time Provider Department Center  02/18/2023  2:40 PM [facility] MM 3 [facility]  05/18/2023  9:10 AM Drema Dallas, DO [facility] None  01/21/2024 11:00 AM Mort Sawyers, FNP [facility] P[facility]   Loree Fee, PharmD Clinical Pharmacist [facility] [phone]

## 2023-01-27 NOTE — Patient Instructions (Signed)
[name],   It was a pleasure to speak with you today! As we discussed:?  I have called [facility] and provided them with the following coupon card for Trulance:  Trulance is very similar to Linzess and works in the same way. These medications are in the same drug class, with Trulance being a little newer than Linzess. I suspect if Linzess worked, the [employer] will work well for you.  In the future, as needed, I do feel that the Linzess copay card should work. I suspect that [facility] was not billing the card correctly.    Future Appointments  Date Time Provider Department Center  02/18/2023  2:40 PM [facility] MM 3 [facility]  05/18/2023  9:10 AM Drema Dallas, DO [facility] None  01/21/2024 11:00 AM Mort Sawyers, FNP [facility]   Please do reach out should you have any further questions or concerns.  Thanks!  Berenice Primas, PharmD - Clinical Pharmacist

## 2023-01-28 ENCOUNTER — Encounter: Payer: Self-pay | Admitting: Pharmacist

## 2023-01-28 NOTE — Progress Notes (Signed)
Called patient's pharmacy to follow up on Trulance copay card.   Pharmacy states that patient's insurance is rejecting the claim but cannot provide additional details. They advised to submit a PA/contact the insurance.   PA initiated via Cover my Meds: PA Code: [id number]   Loree Fee, PharmD Clinical Pharmacist [facility] [phone]

## 2023-02-10 ENCOUNTER — Encounter: Payer: Self-pay | Admitting: Pharmacist

## 2023-02-17 ENCOUNTER — Ambulatory Visit: Payer: BC Managed Care – PPO

## 2023-02-18 ENCOUNTER — Ambulatory Visit
Admission: RE | Admit: 2023-02-18 | Discharge: 2023-02-18 | Disposition: A | Discharge status: Home / Self Care | Payer: BC Managed Care – PPO | Source: Ambulatory Visit | Attending: Family

## 2023-02-18 DIAGNOSIS — Z1231 Encounter for screening mammogram for malignant neoplasm of breast: Secondary | ICD-10-CM | POA: Diagnosis not present

## 2023-05-15 NOTE — Progress Notes (Unsigned)
NEUROLOGY FOLLOW UP OFFICE NOTE  Kristen Dunn [id number]  Assessment/Plan:   Migraine without aura, with status migrainosus improved Probable left sided trigeminal neuralgia   Migraine prevention:  Amitriptyline 50mg  at bedtime; also on magnesium citrate, CoQ10 and riboflavin as well as continuous hormone therapy *** Migraine rescue:  Zavzpret NS, Zofran ODT 8mg  if needed for nausea. *** For trigeminal neuralgia:  gabapentin 100mg  TID *** Limit use of pain relievers to no more than 2 days out of week to prevent risk of rebound or medication-overuse headache. Keep headache diary Follow up 6 months.       Subjective:  Kristen Dunn is a 48 year old right-handed female with asthma and history of gastric bypass who follows up for migraines.  UPDATE: Migraines: *** Intensity:  5/10 Duration:  *** with Zavzpret Frequency:  1 cluster in 5 months, for about a week ***  Trigeminal neuralgia: MRI of face/trigeminal nerves with and without contrast on 12/27/2022 personally reviewed was normal. Increased gabapentin (which she takes for RLS) from 100mg  at bedtime to three times daily.   ***   Current NSAIDS/analgesics:  acetaminophen Current triptans:  none Current ergotamine:  none Current anti-emetic:   Zofran ODT 8mg  Current muscle relaxants:  none Current Antihypertensive medications:  none Current Antidepressant medications:  amitriptyline 50mg  at bedtime Current Anticonvulsant medications:  gabapentin 100mg  TID Current anti-CGRP:  Zavzpret NS (samples) Current Vitamins/Herbal/Supplements:  Mg, B2, CoQ10, B12 Current Antihistamines/Decongestants:  none Other therapy:  none Birth control:  BLISOVI FE continuous hormone therapy (for migraine prevention)     Caffeine:  limits to 2 cups of coffee a day.  Diet Pepsi once in awhile Diet:  Cut out artificial sweeteners (maybe Diet Pepsi once in awhile), 5 cups of water daily.  Does not skip meals Exercise:  Yoga Depression:   no; Anxiety:  no Other pain:  back pain Sleep hygiene:  improved with amitriptyline  HISTORY:  Migraines: Onset:  teenager Location:  starts back of neck and up to front of head, usually left side, sometimes right sided Quality:  throbbing Intensity:  usually 5/10, sometimes 7/10 Aura:  absent Prodrome:  absent Associated symptoms:  Photophobia, phonophobia, osmophobia.  Nausea if severe.  She denies associated visual disturbance, unilateral numbness or weakness. Duration:  On and off all day.  Usually wakes up with them.  Preceded by neck pain. Frequency:  occurs in clusters - daily for 2 weeks about every 2 weeks.  It was more frequent during fall of 2023.  Started on amitriptyline and abortive/analgesic cessation and they have improved.   Frequency of abortive medication: 4 days a week during migraine weeks. Triggers:  certain smells (perfumes), asthma attack, sleep pattern disturbance, seafood, wine, menstrual cycle Relieving factors:  massage, chiropractic therapy (stopped due to "loose joints), yoga Activity:  movement/activity aggravates  Left-sided Trigeminal Neuralgia: She has been experiencing lower left sided facial pain.  She notes a shooting pain from her mouth radiating up the left jaw.  It may be triggered by talking.  If she touches her left nasolabial fold, she may also elicit a shooting pain up the left side of her face.  She saw a dentist who noted multiple teeth with fillings that may be touching a nerve but no active infection/inflammation and pain not reproduced with palpation of the teeth.       Past NSAIDS/analgesics:  Fioricet, Excedrin Migraine, naproxen Past abortive triptans:  rizatriptan 10mg , sumatriptan tab/[place], eletriptan  Past abortive ergotamine:  none Past  muscle relaxants:  Flexeril, Skelaxin - side effects Past anti-emetic:  none Past antihypertensive medications:  none Past antidepressant medications:  none Past anticonvulsant medications:   topiramate Past anti-CGRP:  Bernita Raisin, Nurtec Past vitamins/Herbal/Supplements:  ferrous sulfate Past antihistamines/decongestants:  Benadryl, Zyrtec, Claritin Other past therapies:  none    Family history of headache:  maternal aunt (migraines), niece (headaches)    PAST MEDICAL HISTORY: Past Medical History:  Diagnosis Date   AMA (advanced maternal age) multigravida 35+    Anemia    Asthma    Degenerative lumbar disc    Family history of adverse reaction to anesthesia    mother has nausea   Gestational diabetes    glyburide   Hx of migraines 10/26/2012   Other and unspecified ovarian cyst 03/30/2013   PONV (postoperative nausea and vomiting)     MEDICATIONS: Current Outpatient Medications on File Prior to Visit  Medication Sig Dispense Refill   acetaminophen (TYLENOL) 500 MG tablet Take 1,000 mg by mouth every 6 (six) hours as needed for moderate pain.     albuterol (VENTOLIN HFA) 108 (90 Base) MCG/ACT inhaler Inhale 1-2 puffs into the lungs every 6 (six) hours as needed for wheezing or shortness of breath. 8 g 0   amitriptyline (ELAVIL) 50 MG tablet Take 1 tablet (50 mg total) by mouth at bedtime. 30 tablet 5   BLISOVI FE 1/20 1-20 MG-MCG tablet Take 1 tablet by mouth in the morning. Do not take palcebos 90 tablet 3   Cyanocobalamin (B-12 PO) Take 1 tablet by mouth daily.     gabapentin (NEURONTIN) 100 MG capsule Take 1 capsule (100 mg total) by mouth 3 (three) times daily. 90 capsule 5   MAGNESIUM PO Take 1 tablet by mouth daily.     Plecanatide (TRULANCE) 3 MG TABS Take 1 tablet (3 mg total) by mouth daily. 90 tablet 0   Riboflavin (B-2 PO) Take 1 tablet by mouth daily.     Zavegepant HCl (ZAVZPRET) 10 MG/ACT SOLN Place 1 spray into the nose daily as needed. 6 each 11   No current facility-administered medications on file prior to visit.    ALLERGIES: Allergies  Allergen Reactions   Penicillins Anaphylaxis    Reaction occurred as a very small child; anaphylaxis likely  but not completely certain Has patient had a PCN reaction causing immediate rash, facial/tongue/throat swelling, SOB or lightheadedness with hypotension: Yes Has patient had a PCN reaction causing severe rash involving mucus membranes or skin necrosis: No Has patient had a PCN reaction that required hospitalization No Has patient had a PCN reaction occurring within the last 10 years: No If all of the above answers are "NO", then may procee   Aspirin     Intolerance because of gastric bypass surgery Asthma flares up   Alupent [Metaproterenol] Palpitations and Other (See Comments)    hyperadrenergic tremor   Lactose Intolerance (Gi) Diarrhea   Latex Itching and Dermatitis    FAMILY HISTORY: Family History  Problem Relation Age of Onset   COPD Mother    Asthma Mother    Skin cancer Mother    Hypertension Father    Aneurysm Father        died age 63   Asthma Sister    Migraines Maternal Aunt    Breast cancer Maternal Aunt    Parkinsonism Maternal Grandmother    Thyroid disease Maternal Grandmother    Stroke Maternal Grandmother    Von Willebrand disease Cousin  Objective:  *** General: No acute distress.  Patient appears well-groomed.   Head:  Normocephalic/atraumatic Neck:  Supple.  No paraspinal tenderness.  Full range of motion. Heart:  Regular rate and rhythm. Neuro:  Alert and oriented.  Speech fluent and not dysarthric.  Language intact.  CN II-XII intact.  Bulk and tone normal.  Muscle strength 5/5 throughout.  Deep tendon reflexes 2+ throughout.  Gait normal.  Romberg negative.     Shon Millet, DO  CC: Mort Sawyers, FNP

## 2023-05-18 ENCOUNTER — Encounter: Payer: Self-pay | Admitting: Neurology

## 2023-05-18 ENCOUNTER — Ambulatory Visit (INDEPENDENT_AMBULATORY_CARE_PROVIDER_SITE_OTHER): Payer: BC Managed Care – PPO | Admitting: Neurology

## 2023-05-18 VITALS — BP 136/68 | HR 100 | Ht 68.0 in | Wt 219.0 lb

## 2023-05-18 DIAGNOSIS — G500 Trigeminal neuralgia: Secondary | ICD-10-CM | POA: Diagnosis not present

## 2023-05-18 DIAGNOSIS — G43009 Migraine without aura, not intractable, without status migrainosus: Secondary | ICD-10-CM

## 2023-05-18 MED ORDER — AMITRIPTYLINE HCL 50 MG PO TABS: 50.0000 mg | ORAL_TABLET | Freq: Every day | ORAL | 3 refills | Status: DC

## 2023-05-18 NOTE — Patient Instructions (Signed)
Continue amitriptyline 50mg  at bedtime May use Tylenol, Advil or Zavzpret if needed Limit use of pain relievers to no more than 10 days a month Follow up one year

## 2023-06-12 ENCOUNTER — Encounter: Payer: Self-pay | Admitting: Family

## 2023-06-12 MED ORDER — SULFAMETHOXAZOLE-TRIMETHOPRIM 800-160 MG PO TABS: 1.0000 | ORAL_TABLET | Freq: Two times a day (BID) | ORAL | 0 refills | Status: AC

## 2023-06-30 ENCOUNTER — Encounter: Payer: Self-pay | Admitting: Neurology

## 2023-06-30 ENCOUNTER — Other Ambulatory Visit: Payer: Self-pay | Admitting: Neurology

## 2023-06-30 MED ORDER — GABAPENTIN 100 MG PO CAPS: 100.0000 mg | ORAL_CAPSULE | Freq: Three times a day (TID) | ORAL | 5 refills | Status: DC

## 2023-07-21 ENCOUNTER — Other Ambulatory Visit: Payer: Self-pay | Admitting: Adult Health

## 2023-08-11 ENCOUNTER — Ambulatory Visit (INDEPENDENT_AMBULATORY_CARE_PROVIDER_SITE_OTHER): Admitting: Family

## 2023-08-11 ENCOUNTER — Telehealth: Payer: Self-pay | Admitting: Neurology

## 2023-08-11 VITALS — BP 126/86 | HR 85 | Temp 98.7°F | Ht 65.0 in | Wt 221.6 lb

## 2023-08-11 DIAGNOSIS — G500 Trigeminal neuralgia: Secondary | ICD-10-CM

## 2023-08-11 MED ORDER — CARBAMAZEPINE 200 MG PO TABS: ORAL_TABLET | ORAL | 0 refills | Status: DC

## 2023-08-11 NOTE — Assessment & Plan Note (Addendum)
 Gabapentin  has been helping slightly but not completely.  On gabapentin  100 mg tid. Drowsiness at higher levels.  On amitriptyline  50 mg for migraines, which are managed at current no recent migraines.  I spoke with Dr. Festus Hubert, neurology, and he is in agreement with addition of carbazepine and he will start initiation of treatment and states his office will reach out to pt.  Did also advise her to see dentist just to ensure no recent infections or inflammation visible to warrant increased frequency  MRI reviewed, unremarkable

## 2023-08-11 NOTE — Telephone Encounter (Signed)
 Received message from patient's primary care provider that her trigeminal neuralgia has flared up.  Gabapentin  100mg  three times daily not helping and she cannot tolerate higher doses.  I would like to start carbamazepine .  Please let patient know that I sent a prescription to [facility] on [address] in [place]. Take 1/2 tablet twice daily for one week, then increase to 1 tablet twice daily.  We can increase dose from there.  Have her schedule follow up in 6 months.

## 2023-08-11 NOTE — Progress Notes (Signed)
 Established Patient Office Visit  Subjective:      CC:  Chief Complaint  Patient presents with   Medical Management of Chronic Issues    HPI: Kristen Dunn is a 48 y.o. female presenting on 08/11/2023 for Medical Management of Chronic Issues . Left sided trigeminal neuralgia, recent appt with neurology back in January however symptoms were not bad at that time. She is currently taking amitriptyline  50 mg at night time but this is for migraines. Has restarted with symptoms back in march and progressively getting worse where she can not talk without pain. Has been daily since about march. She is on gabapentin  100 mg tid it helps slightly but not 100%.she has had increased in the past but it made her unable to function due to drowsiness. Worse with rolling over in bed on that side as it is trigger point and just causes the nerve pain.   MRI trigeminal nerves: normal MRI   Has seen dentist in the past and r/o any dental causes. Per note with neurology dentist did note that there could potentially be a tooth triggering a nerve but not any that were evidenced.   She is able to open her mouth completely however limited due to pain. She does have TMJ but she states this is not similar to that, she does state chewing food helps the pain.  Eye exam up to date.      Social history:  Relevant past medical, surgical, family and social history reviewed and updated as indicated. Interim medical history since our last visit reviewed.  Allergies and medications reviewed and updated.  DATA REVIEWED: CHART IN EPIC     ROS: Negative unless specifically indicated above in HPI.    Current Outpatient Medications:    acetaminophen  (TYLENOL ) 500 MG tablet, Take 1,000 mg by mouth every 6 (six) hours as needed for moderate pain., Disp: , Rfl:    albuterol  (VENTOLIN  HFA) 108 (90 Base) MCG/ACT inhaler, Inhale 1-2 puffs into the lungs every 6 (six) hours as needed for wheezing or shortness of  breath., Disp: 8 g, Rfl: 0   amitriptyline  (ELAVIL ) 50 MG tablet, Take 1 tablet (50 mg total) by mouth at bedtime., Disp: 90 tablet, Rfl: 3   BLISOVI  FE 1/20 1-20 MG-MCG tablet, TAKE 1 TABLET BY MOUTH EVERY DAY IN THE MORNING (DO NOT PLACEBOS), Disp: 28 tablet, Rfl: 5   Cyanocobalamin  (B-12 PO), Take 1 tablet by mouth daily., Disp: , Rfl:    gabapentin  (NEURONTIN ) 100 MG capsule, Take 1 capsule (100 mg total) by mouth 3 (three) times daily., Disp: 90 capsule, Rfl: 5   MAGNESIUM PO, Take 1 tablet by mouth daily., Disp: , Rfl:    Riboflavin (B-2 PO), Take 1 tablet by mouth daily., Disp: , Rfl:    Zavegepant HCl (ZAVZPRET ) 10 MG/ACT SOLN, Place 1 spray into the nose daily as needed., Disp: 6 each, Rfl: 11   carbamazepine  (TEGRETOL ) 200 MG tablet, Take 1/2 tablet twice daily for one week, then increase to 1 tablet twice daily, Disp: 60 tablet, Rfl: 0      Objective:    BP 126/86 (BP Location: Right Arm, Patient Position: Sitting, Cuff Size: Large)   Pulse 85   Temp 98.7 F (37.1 C) (Temporal)   Ht 5\' 5"  (1.651 m)   Wt 221 lb 9.6 oz (100.5 kg)   SpO2 99%   BMI 36.88 kg/m   Wt Readings from Last 3 Encounters:  08/11/23 221 lb 9.6 oz (100.5 kg)  05/18/23 219 lb (99.3 kg)  01/19/23 207 lb 12.8 oz (94.3 kg)    Physical Exam Vitals reviewed.  Constitutional:      General: She is not in acute distress.    Appearance: Normal appearance. She is normal weight. She is not ill-appearing, toxic-appearing or diaphoretic.  HENT:     Head: Normocephalic.     Jaw: There is normal jaw occlusion. No tenderness, swelling or pain on movement.     Right Ear: Tympanic membrane normal. No decreased hearing noted.     Left Ear: Tympanic membrane normal. No decreased hearing noted.  No middle ear effusion. Tympanic membrane is not retracted or bulging. Tympanic membrane has normal mobility.     Nose: Nose normal.     Mouth/Throat:     Mouth: Mucous membranes are dry.     Dentition: Normal dentition. Does  not have dentures. No dental tenderness, gingival swelling or dental abscesses.     Pharynx: No oropharyngeal exudate or posterior oropharyngeal erythema.  Eyes:     Extraocular Movements: Extraocular movements intact.     Pupils: Pupils are equal, round, and reactive to light.  Cardiovascular:     Rate and Rhythm: Normal rate and regular rhythm.     Pulses: Normal pulses.     Heart sounds: Normal heart sounds.  Pulmonary:     Effort: Pulmonary effort is normal.     Breath sounds: Normal breath sounds.  Musculoskeletal:     Cervical back: Normal range of motion.  Lymphadenopathy:     Head:     Right side of head: No submental or submandibular adenopathy.     Left side of head: No submental or submandibular adenopathy.  Neurological:     General: No focal deficit present.     Mental Status: She is alert and oriented to person, place, and time. Mental status is at baseline.     Cranial Nerves: Cranial nerves 2-12 are intact. No cranial nerve deficit or facial asymmetry.  Psychiatric:        Mood and Affect: Mood normal.        Behavior: Behavior normal.        Thought Content: Thought content normal.        Judgment: Judgment normal.           Assessment & Plan:  Trigeminal neuralgia Assessment & Plan: Gabapentin  has been helping slightly but not completely.  On gabapentin  100 mg tid. Drowsiness at higher levels.  On amitriptyline  50 mg for migraines, which are managed at current no recent migraines.  I spoke with Dr. Festus Hubert, neurology, and he is in agreement with addition of carbazepine and he will start initiation of treatment and states his office will reach out to pt.  Did also advise her to see dentist just to ensure no recent infections or inflammation visible to warrant increased frequency  MRI reviewed, unremarkable      Return if symptoms worsen or fail to improve.  Felicita Horns, MSN, APRN, FNP-C [facility]

## 2023-08-12 NOTE — Telephone Encounter (Signed)
 Pt called an informed that Dr Festus Hubert stated he Received message from patient's primary care provider that her trigeminal neuralgia has flared up.  Gabapentin  100mg  three times daily not helping and she cannot tolerate higher doses.  I would like to start carbamazepine .  Please let patient know that I sent a prescription to [facility] on [address] in [place]. Take 1/2 tablet twice daily for one week, then increase to 1 tablet twice daily.  We can increase dose from there.  Have her schedule follow up in 6 months. Pt transferred to the front to get scheduled.  Pt is asking will she still take her gabapentin ?

## 2023-08-12 NOTE — Telephone Encounter (Signed)
 Pt stated that she is going to keep taken one gabapentin  at lunch ,

## 2023-09-08 ENCOUNTER — Other Ambulatory Visit: Payer: Self-pay | Admitting: Neurology

## 2023-11-03 ENCOUNTER — Other Ambulatory Visit: Payer: Self-pay | Admitting: Neurology

## 2023-11-28 ENCOUNTER — Other Ambulatory Visit: Payer: Self-pay | Admitting: Adult Health

## 2024-01-15 NOTE — Progress Notes (Unsigned)
 NEUROLOGY FOLLOW UP OFFICE NOTE  Kristen Dunn [id number]  Assessment/Plan:   Migraine without aura, with status migrainosus improved Left sided trigeminal neuralgia   Migraine prevention:  Taper off amitriptyline  as she has been doing well.   Migraine rescue:  Zavzpret  NS, Zofran  ODT 8mg  if needed for nausea.  For trigeminal neuralgia:  increase carbamazepine  to 200mg  in morning, 100mg  in afternoon and 200mg  at bedtime.   Limit use of pain relievers to no more than 2 days out of week to prevent risk of rebound or medication-overuse headache. Keep headache diary Follow up 6 months     Subjective:  Kristen Dunn is a 48 year old right-handed female with asthma and history of gastric bypass who follows up for migraines.  UPDATE: Migraines: Doing well Intensity:  5/10 Duration:  1 hour with Tylenol /Advil  or Zavzpret  Frequency:  average 3 migraines a month.  Trigeminal neuralgia: She has had a recurrence beginning the first week of September.  She t 3/10 - constant -     Current NSAIDS/analgesics:  acetaminophen  Current triptans:  none Current ergotamine:  none Current anti-emetic:   Zofran  ODT 8mg  Current muscle relaxants:  none Current Antihypertensive medications:  none Current Antidepressant medications:  amitriptyline  50mg  at bedtime Current Anticonvulsant medications:  carbamazepine  100mg  in AM, 100mg  in afternoon and 200mg  at bedtime, gabapentin  100mg  (at bedtime PRN for RLS) Current anti-CGRP:  Zavzpret  NS Current Vitamins/Herbal/Supplements:  Mg, B2, CoQ10, B12 Current Antihistamines/Decongestants:  none Other therapy:  none Birth control:  BLISOVI  FE continuous hormone therapy (for migraine prevention)     Caffeine :  limits to 2 cups of coffee a day.  Diet Pepsi once in awhile Diet:  Cut out artificial sweeteners (maybe Diet Pepsi once in awhile), 5 cups of water daily.  Does not skip meals Exercise:  Yoga Depression:  no; Anxiety:  no Other pain:  back  pain Sleep hygiene:  improved with amitriptyline   HISTORY:  Migraines: Onset:  teenager Location:  starts back of neck and up to front of head, usually left side, sometimes right sided Quality:  throbbing Intensity:  usually 5/10, sometimes 7/10 Aura:  absent Prodrome:  absent Associated symptoms:  Photophobia, phonophobia, osmophobia.  Nausea if severe.  She denies associated visual disturbance, unilateral numbness or weakness. Duration:  On and off all day.  Usually wakes up with them.  Preceded by neck pain. Frequency:  occurs in clusters - daily for 2 weeks about every 2 weeks.  It was more frequent during fall of 2023.  Started on amitriptyline  and abortive/analgesic cessation and they have improved.   Frequency of abortive medication: 4 days a week during migraine weeks. Triggers:  certain smells (perfumes), asthma attack, sleep pattern disturbance, seafood, wine, menstrual cycle Relieving factors:  massage, chiropractic therapy (stopped due to loose joints), yoga Activity:  movement/activity aggravates  Left-sided Trigeminal Neuralgia: She has been experiencing lower left sided facial pain.  She notes a shooting pain from her mouth radiating up the left jaw.  It may be triggered by talking.  If she touches her left nasolabial fold, she may also elicit a shooting pain up the left side of her face.  She saw a dentist who noted multiple teeth with fillings that may be touching a nerve but no active infection/inflammation and pain not reproduced with palpation of the teeth.     MRI of face/trigeminal nerves with and without contrast on 12/27/2022 was normal.   Past NSAIDS/analgesics:  Fioricet, Excedrin Migraine, naproxen Past abortive  triptans:  rizatriptan  10mg , sumatriptan  tab/[place], eletriptan  Past abortive ergotamine:  none Past muscle relaxants:  Flexeril , Skelaxin  - side effects Past anti-emetic:  none Past antihypertensive medications:  none Past antidepressant medications:   none Past anticonvulsant medications:  topiramate Past anti-CGRP:  [place], Nurtec Past vitamins/Herbal/Supplements:  ferrous sulfate Past antihistamines/decongestants:  Benadryl , Zyrtec, Claritin Other past therapies:  none    Family history of headache:  maternal aunt (migraines), niece (headaches)    PAST MEDICAL HISTORY: Past Medical History:  Diagnosis Date   AMA (advanced maternal age) multigravida 35+    Anemia    Asthma    Degenerative lumbar disc    Family history of adverse reaction to anesthesia    mother has nausea   Gestational diabetes    glyburide    Hx of migraines 10/26/2012   Other and unspecified ovarian cyst 03/30/2013   PONV (postoperative nausea and vomiting)     MEDICATIONS: Current Outpatient Medications on File Prior to Visit  Medication Sig Dispense Refill   acetaminophen  (TYLENOL ) 500 MG tablet Take 1,000 mg by mouth every 6 (six) hours as needed for moderate pain.     albuterol  (VENTOLIN  HFA) 108 (90 Base) MCG/ACT inhaler Inhale 1-2 puffs into the lungs every 6 (six) hours as needed for wheezing or shortness of breath. 8 g 0   BLISOVI  FE 1/20 1-20 MG-MCG tablet TAKE 1 TABLET BY MOUTH EVERY DAY IN THE MORNING (DO NOT PLACEBOS) 28 tablet 5   Cyanocobalamin  (B-12 PO) Take 1 tablet by mouth daily.     MAGNESIUM PO Take 1 tablet by mouth daily.     Riboflavin (B-2 PO) Take 1 tablet by mouth daily.     Zavegepant HCl (ZAVZPRET ) 10 MG/ACT SOLN Place 1 spray into the nose daily as needed. 6 each 11   No current facility-administered medications on file prior to visit.     ALLERGIES: Allergies  Allergen Reactions   Penicillins Anaphylaxis    Reaction occurred as a very small child; anaphylaxis likely but not completely certain Has patient had a PCN reaction causing immediate rash, facial/tongue/throat swelling, SOB or lightheadedness with hypotension: Yes Has patient had a PCN reaction causing severe rash involving mucus membranes or skin necrosis:  No Has patient had a PCN reaction that required hospitalization No Has patient had a PCN reaction occurring within the last 10 years: No If all of the above answers are NO, then may procee   Aspirin     Intolerance because of gastric bypass surgery Asthma flares up   Alupent [Metaproterenol] Palpitations and Other (See Comments)    hyperadrenergic tremor   Lactose Intolerance (Gi) Diarrhea   Latex Itching and Dermatitis    FAMILY HISTORY: Family History  Problem Relation Age of Onset   COPD Mother    Asthma Mother    Skin cancer Mother    Hypertension Father    Aneurysm Father        died age 65   Asthma Sister    Migraines Maternal Aunt    Breast cancer Maternal Aunt    Parkinsonism Maternal Grandmother    Thyroid  disease Maternal Grandmother    Stroke Maternal Grandmother    Von Willebrand disease Cousin       Objective:  Blood pressure (!) 140/82, pulse 93, height 5' 5 (1.651 m), weight 229 lb (103.9 kg), SpO2 100%. General: No acute distress.  Patient appears well-groomed.        Juliene Dunnings, DO  CC: Tabitha Dugal, FNP

## 2024-01-18 ENCOUNTER — Ambulatory Visit (INDEPENDENT_AMBULATORY_CARE_PROVIDER_SITE_OTHER): Admitting: Neurology

## 2024-01-18 VITALS — BP 140/82 | HR 93 | Ht 65.0 in | Wt 229.0 lb

## 2024-01-18 DIAGNOSIS — G43009 Migraine without aura, not intractable, without status migrainosus: Secondary | ICD-10-CM

## 2024-01-18 DIAGNOSIS — G500 Trigeminal neuralgia: Secondary | ICD-10-CM

## 2024-01-18 MED ORDER — CARBAMAZEPINE 200 MG PO TABS: ORAL_TABLET | ORAL | 1 refills | Status: AC

## 2024-01-18 NOTE — Patient Instructions (Signed)
 Take carbamazepine  200mg  - 1 tab in morning, 1/2 tab in afternoon and 1 tab at bedtime Take 1/2 tab of amitriptyline  at bedtime for one month and then stop. Zavzpret  and Zofran  for migraine as needed. Follow up 6 months.

## 2024-01-19 ENCOUNTER — Encounter: Payer: Self-pay | Admitting: Neurology

## 2024-01-21 ENCOUNTER — Encounter: Payer: Self-pay | Admitting: Family

## 2024-01-21 ENCOUNTER — Ambulatory Visit: Payer: BC Managed Care – PPO | Admitting: Family

## 2024-01-21 VITALS — BP 128/98 | HR 80 | Temp 97.9°F | Ht 65.0 in | Wt 230.6 lb

## 2024-01-21 DIAGNOSIS — R635 Abnormal weight gain: Secondary | ICD-10-CM | POA: Diagnosis not present

## 2024-01-21 DIAGNOSIS — K5904 Chronic idiopathic constipation: Secondary | ICD-10-CM

## 2024-01-21 DIAGNOSIS — R7303 Prediabetes: Secondary | ICD-10-CM

## 2024-01-21 DIAGNOSIS — E7800 Pure hypercholesterolemia, unspecified: Secondary | ICD-10-CM

## 2024-01-21 DIAGNOSIS — E538 Deficiency of other specified B group vitamins: Secondary | ICD-10-CM | POA: Diagnosis not present

## 2024-01-21 DIAGNOSIS — Z0001 Encounter for general adult medical examination with abnormal findings: Secondary | ICD-10-CM

## 2024-01-21 DIAGNOSIS — D500 Iron deficiency anemia secondary to blood loss (chronic): Secondary | ICD-10-CM

## 2024-01-21 DIAGNOSIS — R5383 Other fatigue: Secondary | ICD-10-CM

## 2024-01-21 LAB — CBC
HCT: 37.2 % (ref 36.0–46.0)
Hemoglobin: 12.1 g/dL (ref 12.0–15.0)
MCHC: 32.6 g/dL (ref 30.0–36.0)
MCV: 87.6 fl (ref 78.0–100.0)
Platelets: 443 K/uL — ABNORMAL HIGH (ref 150.0–400.0)
RBC: 4.25 Mil/uL (ref 3.87–5.11)
RDW: 13.5 % (ref 11.5–15.5)
WBC: 5.4 K/uL (ref 4.0–10.5)

## 2024-01-21 LAB — BASIC METABOLIC PANEL WITH GFR
BUN: 13 mg/dL (ref 6–23)
CO2: 27 meq/L (ref 19–32)
Calcium: 9.3 mg/dL (ref 8.4–10.5)
Chloride: 98 meq/L (ref 96–112)
Creatinine, Ser: 0.78 mg/dL (ref 0.40–1.20)
GFR: 89.17 mL/min (ref >60.00)
Glucose, Bld: 88 mg/dL (ref 70–99)
Potassium: 4.8 meq/L (ref 3.5–5.1)
Sodium: 136 meq/L (ref 135–145)

## 2024-01-21 LAB — TSH: TSH: 1.15 u[IU]/mL (ref 0.35–5.50)

## 2024-01-21 LAB — IBC + FERRITIN
Ferritin: 5.3 ng/mL — ABNORMAL LOW (ref 10.0–291.0)
Iron: 59 ug/dL (ref 42–145)
Saturation Ratios: 11 % — ABNORMAL LOW (ref 20.0–50.0)
TIBC: 534.8 ug/dL — ABNORMAL HIGH (ref 250.0–450.0)
Transferrin: 382 mg/dL — ABNORMAL HIGH (ref 212.0–360.0)

## 2024-01-21 LAB — T4, FREE: Free T4: 0.72 ng/dL (ref 0.60–1.60)

## 2024-01-21 LAB — T3, FREE: T3, Free: 3.4 pg/mL (ref 2.3–4.2)

## 2024-01-21 LAB — HEMOGLOBIN A1C: Hgb A1c MFr Bld: 6 % (ref 4.6–6.5)

## 2024-01-21 LAB — VITAMIN B12: Vitamin B-12: 605 pg/mL (ref 211–911)

## 2024-01-21 MED ORDER — TRULANCE 3 MG PO TABS: 3.0000 mg | ORAL_TABLET | Freq: Every day | ORAL | 0 refills | Status: AC

## 2024-01-21 NOTE — Progress Notes (Signed)
 Subjective:  Patient ID: Kristen Dunn, female    DOB: 04-09-1976  Age: 48 y.o. MRN: [id number]  Patient Care Team: Corwin Antu, FNP as PCP - General (Family Medicine) [place], Andrez SAUNDERS, PA-C (Inactive) as Physician Assistant (Dermatology) Skeet Juliene SAUNDERS, DO as Consulting Physician (Neurology)   CC:  Chief Complaint  Patient presents with   Annual Exam    HPI Kristen Dunn is a 48 y.o. female who presents today for an annual physical exam. She reports consuming a general diet calorie counting and still gaining weight. Exercise as tolerated She generally feels well. She reports sleeping fairly well. She does have additional problems to discuss today.   Vision:Within last year Dental:Receives regular dental care  Mammogram: 02/18/23 Last pap: 01/02/21 Colonoscopy: 03/22/21 03/23/2031  Pt is with acute concerns.   Discussed the use of AI scribe software for clinical note transcription with the patient, who gave verbal consent to proceed.  History of Present Illness Kristen Dunn is a 48 year old female who presents for an annual physical exam and evaluation of weight gain.  She has experienced a significant weight gain of 30 pounds over the past three months despite calorie counting. She attributes part of this weight gain to her medication, specifically amitriptyline , which she has recently discontinued. She was surprised by the increase when she visited the neurologist earlier this week. She is hesitant to start GLP-1 agonists for weight loss at this time as she is a prior bypass pt and with constipation.   She has a history of migraines and is currently using Zazapret nasal spray for migraine rescue, which she finds effective. She does not experience migraines with aura. She is also taking Zofran  as needed and has increased her carbamazepine  dosage to 200 mg in the morning, 100 mg in the afternoon, and 200 mg in the evening, which has helped manage her symptoms.  She  experiences chronic constipation and has tried Linzess  in the past, which was effective but cost-prohibitive.   She feels more tired than usual and has a history of iron deficiency. No symptoms of sleep apnea such as snoring or morning headaches. She reports feeling more tired than usual and takes carbamazepine  in the morning and afternoon.     Wt Readings from Last 3 Encounters:  01/21/24 230 lb 9.6 oz (104.6 kg)  01/19/24 229 lb (103.9 kg)  08/11/23 221 lb 9.6 oz (100.5 kg)    Advanced Directives Patient does not have advanced directives   DEPRESSION SCREENING    01/21/2024   11:15 AM 01/19/2023   10:50 AM 01/10/2022    9:44 AM 08/21/2021    4:03 PM 01/02/2021    3:58 PM 01/02/2020    3:32 PM 10/27/2017    4:14 PM  PHQ 2/9 Scores  PHQ - 2 Score 0 0 0 0 0 0 0  PHQ- 9 Score 2 2 2 1 1 1       ROS: Negative unless specifically indicated above in HPI.    Current Outpatient Medications:    acetaminophen  (TYLENOL ) 500 MG tablet, Take 1,000 mg by mouth every 6 (six) hours as needed for moderate pain., Disp: , Rfl:    albuterol  (VENTOLIN  HFA) 108 (90 Base) MCG/ACT inhaler, Inhale 1-2 puffs into the lungs every 6 (six) hours as needed for wheezing or shortness of breath., Disp: 8 g, Rfl: 0   BLISOVI  FE 1/20 1-20 MG-MCG tablet, TAKE 1 TABLET BY MOUTH EVERY DAY IN THE MORNING (DO NOT PLACEBOS),  Disp: 28 tablet, Rfl: 5   carbamazepine  (TEGRETOL ) 200 MG tablet, Take 1 tablet in the morning, 1/2 tablet in afternoon and 1 tablet at bedtime, Disp: 225 tablet, Rfl: 1   Cyanocobalamin  (B-12 PO), Take 1 tablet by mouth daily., Disp: , Rfl:    MAGNESIUM PO, Take 1 tablet by mouth daily., Disp: , Rfl:    Plecanatide  (TRULANCE ) 3 MG TABS, Take 1 tablet (3 mg total) by mouth daily., Disp: 90 tablet, Rfl: 0   Riboflavin (B-2 PO), Take 1 tablet by mouth daily., Disp: , Rfl:    Zavegepant HCl (ZAVZPRET ) 10 MG/ACT SOLN, Place 1 spray into the nose daily as needed., Disp: 6 each, Rfl: 11    Objective:     BP (!) 128/98 (BP Location: Left Arm, Patient Position: Sitting, Cuff Size: Large)   Pulse 80   Temp 97.9 F (36.6 C) (Temporal)   Ht 5' 5 (1.651 m)   Wt 230 lb 9.6 oz (104.6 kg)   SpO2 98%   BMI 38.37 kg/m   BP Readings from Last 3 Encounters:  01/21/24 (!) 128/98  01/19/24 (!) 140/82  08/11/23 126/86      Physical Exam Vitals reviewed.  Constitutional:      General: She is not in acute distress.    Appearance: Normal appearance. She is obese. She is not ill-appearing.  HENT:     Head: Normocephalic.     Right Ear: Tympanic membrane normal.     Left Ear: Tympanic membrane normal.     Nose: Nose normal.     Mouth/Throat:     Mouth: Mucous membranes are moist.  Eyes:     Extraocular Movements: Extraocular movements intact.     Pupils: Pupils are equal, round, and reactive to light.  Cardiovascular:     Rate and Rhythm: Normal rate and regular rhythm.  Pulmonary:     Effort: Pulmonary effort is normal.     Breath sounds: Normal breath sounds.  Abdominal:     General: Abdomen is flat. Bowel sounds are normal.     Palpations: Abdomen is soft.     Tenderness: There is no guarding or rebound.  Musculoskeletal:        General: Normal range of motion.     Cervical back: Normal range of motion.  Skin:    General: Skin is warm.     Capillary Refill: Capillary refill takes less than 2 seconds.  Neurological:     General: No focal deficit present.     Mental Status: She is alert.  Psychiatric:        Mood and Affect: Mood normal.        Behavior: Behavior normal.        Thought Content: Thought content normal.        Judgment: Judgment normal.       Results       Assessment & Plan:   Assessment and Plan Assessment & Plan Adult Wellness Visit Routine adult wellness visit conducted. Patient Counseling(The following topics were reviewed):  Preventative care handout given to pt  Health maintenance and immunizations reviewed. Please refer to Health  maintenance section. Pt advised on safe sex, wearing seatbelts in car, and proper nutrition labwork ordered today for annual Dental health: Discussed importance of regular tooth brushing, flossing, and dental visits.  Abnormal weight gain Significant weight gain of 30 pounds over three months despite calorie counting. Suspected contribution from amitriptyline . No thyroid  function tests conducted by neurologist. She prefers not to  start GLP-1 agonists at this time. - Discontinue amitriptyline  as per neurologist's recommendation. - Order thyroid  function tests to rule out thyroid  dysfunction. - Monitor weight and consider alternative medications if necessary.  Chronic idiopathic constipation Chronic idiopathic constipation with intermittent relief. Previous trial of Linzess  was effective but cost-prohibitive. Discussed alternative options such as Trulance  and Amitiza. - Consider trial of Trulance  if Linzess  is not affordable.  Migraine Migraine management with Zavegepant nasal spray is effective. No current migraines during episodes of trigeminal neuralgia. Neurologist prefers to avoid surgery unless absolutely necessary and symptoms are unmanageable. - Continue Zavegepant nasal spray as needed for migraine relief.  Fatigue Increased fatigue, possibly related to medication regimen including carbamazepine . Discussed potential contribution of weight gain and sleep apnea. - Check B12 levels due to fatigue. - Consider sleep apnea evaluation if fatigue persists despite weight management.  Prediabetes Mild prediabetes with recent weight gain potentially exacerbating condition. - Order repeat glucose testing to monitor prediabetes status.          Follow-up: Return in about 1 year (around 01/20/2025) for f/u CPE.   Ginger Patrick, FNP

## 2024-01-27 ENCOUNTER — Ambulatory Visit: Payer: Self-pay | Admitting: Family

## 2024-01-27 DIAGNOSIS — D500 Iron deficiency anemia secondary to blood loss (chronic): Secondary | ICD-10-CM

## 2024-02-18 ENCOUNTER — Ambulatory Visit: Admitting: Neurology

## 2024-02-18 ENCOUNTER — Other Ambulatory Visit: Payer: Self-pay | Admitting: Obstetrics & Gynecology

## 2024-02-18 DIAGNOSIS — Z1231 Encounter for screening mammogram for malignant neoplasm of breast: Secondary | ICD-10-CM

## 2024-02-19 ENCOUNTER — Ambulatory Visit
Admission: RE | Admit: 2024-02-19 | Discharge: 2024-02-19 | Disposition: A | Discharge status: Home / Self Care | Source: Ambulatory Visit | Attending: Obstetrics & Gynecology | Admitting: Obstetrics & Gynecology

## 2024-02-19 DIAGNOSIS — Z1231 Encounter for screening mammogram for malignant neoplasm of breast: Secondary | ICD-10-CM | POA: Diagnosis not present

## 2024-02-24 ENCOUNTER — Encounter: Payer: Self-pay | Admitting: Oncology

## 2024-02-24 ENCOUNTER — Inpatient Hospital Stay

## 2024-02-24 ENCOUNTER — Inpatient Hospital Stay: Attending: Oncology | Admitting: Oncology

## 2024-02-24 VITALS — BP 129/83 | HR 92 | Temp 97.4°F | Resp 18 | Ht 65.0 in | Wt 228.8 lb

## 2024-02-24 DIAGNOSIS — Z793 Long term (current) use of hormonal contraceptives: Secondary | ICD-10-CM | POA: Diagnosis not present

## 2024-02-24 DIAGNOSIS — K5900 Constipation, unspecified: Secondary | ICD-10-CM | POA: Insufficient documentation

## 2024-02-24 DIAGNOSIS — Z79899 Other long term (current) drug therapy: Secondary | ICD-10-CM | POA: Insufficient documentation

## 2024-02-24 DIAGNOSIS — D75839 Thrombocytosis, unspecified: Secondary | ICD-10-CM | POA: Insufficient documentation

## 2024-02-24 DIAGNOSIS — Z9884 Bariatric surgery status: Secondary | ICD-10-CM | POA: Diagnosis not present

## 2024-02-24 DIAGNOSIS — R5383 Other fatigue: Secondary | ICD-10-CM | POA: Insufficient documentation

## 2024-02-24 DIAGNOSIS — E611 Iron deficiency: Secondary | ICD-10-CM

## 2024-02-24 DIAGNOSIS — F5089 Other specified eating disorder: Secondary | ICD-10-CM | POA: Diagnosis not present

## 2024-02-24 DIAGNOSIS — D509 Iron deficiency anemia, unspecified: Secondary | ICD-10-CM | POA: Diagnosis not present

## 2024-02-24 NOTE — Progress Notes (Signed)
 [facility]  HEMATOLOGY NEW VISIT  Corwin Antu, FNP  REASON FOR REFERRAL: Iron deficiency   HISTORY OF PRESENT ILLNESS: Kristen Dunn 48 y.o. female referred for iron deficiency.  Patient reported that she has a history of iron deficiency in 2013 when she had her first given received IV iron infusion.  She had a gastric bypass done almost 15 years ago.  She reports fatigue and she also associates it with carbamazepine  use.  She has a past medical history of migraines and chronic constipation.  She reports not able to tolerate iron because of severe constipation.  She also reports significant pica that her husband had to buy an ice machine for.  She denies abdominal pain, rectal bleeding, melena.  She is on chronic birth control and does not have any menstruation.  She also reported significant weight gain of 30 pounds in the past 1 month and has been recently taken off of amitriptyline .  She has no other complaints today.  I have reviewed the past medical history, past surgical history, social history and family history with the patient   ALLERGIES:  is allergic to penicillins, aspirin, alupent [metaproterenol], lactose intolerance (gi), and latex.  MEDICATIONS:  Current Outpatient Medications  Medication Sig Dispense Refill   acetaminophen  (TYLENOL ) 500 MG tablet Take 1,000 mg by mouth every 6 (six) hours as needed for moderate pain.     albuterol  (VENTOLIN  HFA) 108 (90 Base) MCG/ACT inhaler Inhale 1-2 puffs into the lungs every 6 (six) hours as needed for wheezing or shortness of breath. 8 g 0   BLISOVI  FE 1/20 1-20 MG-MCG tablet TAKE 1 TABLET BY MOUTH EVERY DAY IN THE MORNING (DO NOT PLACEBOS) 28 tablet 5   carbamazepine  (TEGRETOL ) 200 MG tablet Take 1 tablet in the morning, 1/2 tablet in afternoon and 1 tablet at bedtime 225 tablet 1   Cyanocobalamin  (B-12 PO) Take 1 tablet by mouth daily.     MAGNESIUM PO Take 1 tablet by mouth daily.      Plecanatide  (TRULANCE ) 3 MG TABS Take 1 tablet (3 mg total) by mouth daily. 90 tablet 0   Riboflavin (B-2 PO) Take 1 tablet by mouth daily.     Zavegepant HCl (ZAVZPRET ) 10 MG/ACT SOLN Place 1 spray into the nose daily as needed. 6 each 11   No current facility-administered medications for this visit.     REVIEW OF SYSTEMS:   Constitutional: Denies fevers, chills or night sweats Eyes: Denies blurriness of vision Ears, nose, mouth, throat, and face: Denies mucositis or sore throat Respiratory: Denies cough, dyspnea or wheezes Cardiovascular: Denies palpitation, chest discomfort or lower extremity swelling Gastrointestinal:  Denies nausea, heartburn or change in bowel habits Skin: Denies abnormal skin rashes Lymphatics: Denies new lymphadenopathy or easy bruising Neurological:Denies numbness, tingling or new weaknesses Behavioral/Psych: Mood is stable, no new changes  All other systems were reviewed with the patient and are negative.  PHYSICAL EXAMINATION:   Vitals:   02/24/24 0828 02/24/24 0836  BP: (!) 144/82 129/83  Pulse: 92   Resp: 18   Temp: (!) 97.4 F (36.3 C)   SpO2: 100%     GENERAL:alert, no distress and comfortable SKIN: skin color, texture, turgor are normal, no rashes or significant lesions LYMPH:  no palpable lymphadenopathy in the cervical, axillary or inguinal LUNGS: clear to auscultation and percussion with normal breathing effort HEART: regular rate & rhythm and no murmurs and no lower extremity edema ABDOMEN:abdomen soft, non-tender and  normal bowel sounds Musculoskeletal:no cyanosis of digits and no clubbing  NEURO: alert & oriented x 3 with fluent speech  LABORATORY DATA:  I have reviewed the data as listed  Lab Results  Component Value Date   WBC 5.4 01/21/2024   NEUTROABS 1.8 01/17/2022   HGB 12.1 01/21/2024   HCT 37.2 01/21/2024   MCV 87.6 01/21/2024   PLT 443.0 (H) 01/21/2024      Chemistry      Component Value Date/Time   NA 136  01/21/2024 1150   K 4.8 01/21/2024 1150   CL 98 01/21/2024 1150   CO2 27 01/21/2024 1150   BUN 13 01/21/2024 1150   CREATININE 0.78 01/21/2024 1150   CREATININE 0.93 04/06/2014 1230      Component Value Date/Time   CALCIUM 9.3 01/21/2024 1150   ALKPHOS 60 03/10/2022 0900   AST 17 03/10/2022 0900   ALT 10 03/10/2022 0900   BILITOT 0.9 03/10/2022 0900      Latest Reference Range & Units 01/21/24 11:50  Iron 42 - 145 ug/dL 59  TIBC 749.9 - 549.9 mcg/dL 465.1 (H)  Saturation Ratios 20.0 - 50.0 % 11.0 (L)  Ferritin 10.0 - 291.0 ng/mL 5.3 (L)  Transferrin 212.0 - 360.0 mg/dL 617.9 (H)  Vitamin B12 211 - 911 pg/mL 605  (H): Data is abnormally high (L): Data is abnormally low  RADIOGRAPHIC STUDIES: I have personally reviewed the radiological images as listed and agreed with the findings in the report.  MM 3D SCREENING MAMMOGRAM BILATERAL BREAST CLINICAL DATA:  Screening.  EXAM: DIGITAL SCREENING BILATERAL MAMMOGRAM WITH TOMOSYNTHESIS AND CAD  TECHNIQUE: Bilateral screening digital craniocaudal and mediolateral oblique mammograms were obtained. Bilateral screening digital breast tomosynthesis was performed. The images were evaluated with computer-aided detection.  COMPARISON:  Previous exam(s).  ACR Breast Density Category b: There are scattered areas of fibroglandular density.  FINDINGS: There are no findings suspicious for malignancy.  IMPRESSION: No mammographic evidence of malignancy. A result letter of this screening mammogram will be mailed directly to the patient.  RECOMMENDATION: Screening mammogram in one year. (Code:[id number]-B-01Y)  BI-RADS CATEGORY  1: Negative.  Electronically Signed   By: Dina  Arceo M.D.   On: 02/23/2024 10:23   ASSESSMENT & PLAN:  Patient is a 48 y.o. female referred for iron deficiency  Iron deficiency Patient has iron deficiency without anemia.  The most likely cause of her anemia is due to chronic blood loss Lab Results   Component Value Date   IRON 59 01/21/2024   TIBC 534.8 (H) 01/21/2024   FERRITIN 5.3 (L) 01/21/2024    Latest Reference Range & Units 01/21/24 11:50  Saturation Ratios 20.0 - 50.0 % 11.0 (L)  (L): Data is abnormally low Last colonoscopy: 03/2021: one serrated polyp and non bleeding internal hemorrhoids   -We discussed some of the risks, benefits, and alternatives of intravenous iron infusions. The patient is symptomatic from anemia and the iron level is critically low. She tolerated oral iron supplement poorly and desires to achieve higher levels of iron faster for adequate hematopoesis. Some of the side-effects to be expected including risks of infusion reactions, phlebitis, headaches, nausea and fatigue.  The patient is willing to proceed. Goal is to keep ferritin level greater than 50 and resolution of anemia - Would not recommend oral iron because of chronic constipation - Encouraged patient to have high-protein, vitamin C and greens in her diet - If patient has continued iron deficiency, will reach out to GI for further workup  Return to clinic in 8 weeks with labs to assess response to IV iron  Thrombocytosis Likely secondary to iron deficiency  - Will proceed with IV iron - Continue to monitor for    Orders Placed This Encounter  Procedures   Ferritin    Standing Status:   Future    Expected Date:   04/18/2024    Expiration Date:   07/17/2024   Folate    Standing Status:   Future    Expected Date:   04/18/2024    Expiration Date:   07/17/2024   Vitamin B12    Standing Status:   Future    Expected Date:   04/18/2024    Expiration Date:   07/17/2024   CBC with Differential/Platelet    Standing Status:   Future    Expected Date:   04/18/2024    Expiration Date:   07/17/2024   Comprehensive metabolic panel with GFR    Standing Status:   Future    Expected Date:   04/18/2024    Expiration Date:   07/17/2024   Iron and TIBC    Standing Status:   Future    Expected  Date:   04/18/2024    Expiration Date:   07/17/2024    The total time spent in the appointment was 30 minutes encounter with patients including review of chart and various tests results, discussions about plan of care and coordination of care plan   All questions were answered. The patient knows to call the clinic with any problems, questions or concerns. No barriers to learning was detected.   Mickiel Dry, MD 11/5/20259:20 AM

## 2024-02-25 ENCOUNTER — Inpatient Hospital Stay

## 2024-02-25 VITALS — BP 142/90 | HR 94 | Temp 98.0°F | Resp 18

## 2024-02-25 DIAGNOSIS — D75839 Thrombocytosis, unspecified: Secondary | ICD-10-CM | POA: Diagnosis not present

## 2024-02-25 DIAGNOSIS — D509 Iron deficiency anemia, unspecified: Secondary | ICD-10-CM | POA: Diagnosis not present

## 2024-02-25 DIAGNOSIS — Z793 Long term (current) use of hormonal contraceptives: Secondary | ICD-10-CM | POA: Diagnosis not present

## 2024-02-25 DIAGNOSIS — Z79899 Other long term (current) drug therapy: Secondary | ICD-10-CM | POA: Diagnosis not present

## 2024-02-25 DIAGNOSIS — R5383 Other fatigue: Secondary | ICD-10-CM | POA: Diagnosis not present

## 2024-02-25 DIAGNOSIS — F5089 Other specified eating disorder: Secondary | ICD-10-CM | POA: Diagnosis not present

## 2024-02-25 DIAGNOSIS — E611 Iron deficiency: Secondary | ICD-10-CM

## 2024-02-25 DIAGNOSIS — Z9884 Bariatric surgery status: Secondary | ICD-10-CM | POA: Diagnosis not present

## 2024-02-25 DIAGNOSIS — K5900 Constipation, unspecified: Secondary | ICD-10-CM | POA: Diagnosis not present

## 2024-02-25 MED ORDER — SODIUM CHLORIDE 0.9 % IV SOLN
510.0000 mg | Freq: Once | INTRAVENOUS | Status: AC
  Administered 2024-02-25: 510 mg via INTRAVENOUS
  Filled 2024-02-25: qty 510

## 2024-02-25 MED ORDER — SODIUM CHLORIDE 0.9 % IV SOLN: INTRAVENOUS | Status: DC

## 2024-02-25 MED ORDER — ACETAMINOPHEN 325 MG PO TABS
650.0000 mg | ORAL_TABLET | Freq: Once | ORAL | Status: AC
  Administered 2024-02-25: 650 mg via ORAL
  Filled 2024-02-25: qty 2

## 2024-02-25 MED ORDER — CETIRIZINE HCL 10 MG PO TABS
10.0000 mg | ORAL_TABLET | Freq: Once | ORAL | Status: AC
  Administered 2024-02-25: 10 mg via ORAL
  Filled 2024-02-25: qty 1

## 2024-02-25 NOTE — Patient Instructions (Signed)

## 2024-02-25 NOTE — Progress Notes (Signed)
 Patient presents today for iron infusion.  Patient is in satisfactory condition with no new complaints voiced.  Vital signs are stable.  We will proceed with infusion per provider orders.    Peripheral IV started with good pre and post infusion.  Feraheme  510 mg IV iron infusion given today per MD orders. Tolerated infusion without adverse affects. Vital signs stable. No complaints at this time. Discharged from clinic ambulatory in stable condition. Alert and oriented x 3. F/U with [facility] as scheduled.

## 2024-02-28 ENCOUNTER — Encounter: Payer: Self-pay | Admitting: Neurology

## 2024-03-03 ENCOUNTER — Inpatient Hospital Stay

## 2024-03-03 VITALS — BP 122/74 | HR 84 | Temp 97.6°F | Resp 19

## 2024-03-03 DIAGNOSIS — R5383 Other fatigue: Secondary | ICD-10-CM | POA: Diagnosis not present

## 2024-03-03 DIAGNOSIS — E611 Iron deficiency: Secondary | ICD-10-CM

## 2024-03-03 DIAGNOSIS — Z79899 Other long term (current) drug therapy: Secondary | ICD-10-CM | POA: Diagnosis not present

## 2024-03-03 DIAGNOSIS — K5900 Constipation, unspecified: Secondary | ICD-10-CM | POA: Diagnosis not present

## 2024-03-03 DIAGNOSIS — D509 Iron deficiency anemia, unspecified: Secondary | ICD-10-CM | POA: Diagnosis not present

## 2024-03-03 DIAGNOSIS — F5089 Other specified eating disorder: Secondary | ICD-10-CM | POA: Diagnosis not present

## 2024-03-03 DIAGNOSIS — Z793 Long term (current) use of hormonal contraceptives: Secondary | ICD-10-CM | POA: Diagnosis not present

## 2024-03-03 DIAGNOSIS — Z9884 Bariatric surgery status: Secondary | ICD-10-CM | POA: Diagnosis not present

## 2024-03-03 DIAGNOSIS — D75839 Thrombocytosis, unspecified: Secondary | ICD-10-CM | POA: Diagnosis not present

## 2024-03-03 MED ORDER — SODIUM CHLORIDE 0.9 % IV SOLN: INTRAVENOUS | Status: DC

## 2024-03-03 MED ORDER — DOCUSATE SODIUM 100 MG PO CAPS: 100.0000 mg | ORAL_CAPSULE | Freq: Two times a day (BID) | ORAL | 0 refills | Status: AC

## 2024-03-03 MED ORDER — PROCHLORPERAZINE MALEATE 10 MG PO TABS: 10.0000 mg | ORAL_TABLET | Freq: Four times a day (QID) | ORAL | 1 refills | Status: AC | PRN

## 2024-03-03 MED ORDER — SODIUM CHLORIDE 0.9 % IV SOLN
510.0000 mg | Freq: Once | INTRAVENOUS | Status: AC
  Administered 2024-03-03: 510 mg via INTRAVENOUS
  Filled 2024-03-03: qty 510

## 2024-03-03 NOTE — Progress Notes (Signed)
 Patient tolerated iron infusion with no complaints voiced.  Peripheral IV site clean and dry with good blood return noted before and after infusion. Pt observed for 15 minutes post iron infusion per pt request without any complications. VSS with discharge and left in satisfactory condition with no s/s of distress noted. All follow ups as scheduled.   Kristen Dunn

## 2024-03-03 NOTE — Patient Instructions (Signed)

## 2024-03-23 MED ORDER — QULIPTA 60 MG PO TABS: 60.0000 mg | ORAL_TABLET | Freq: Every day | ORAL | 5 refills | Status: AC

## 2024-04-07 ENCOUNTER — Other Ambulatory Visit: Payer: Self-pay | Admitting: Adult Health

## 2024-04-11 ENCOUNTER — Other Ambulatory Visit: Payer: Self-pay | Admitting: Neurology

## 2024-04-18 ENCOUNTER — Encounter: Payer: Self-pay | Admitting: *Deleted

## 2024-04-26 ENCOUNTER — Inpatient Hospital Stay: Attending: Oncology

## 2024-04-26 DIAGNOSIS — E611 Iron deficiency: Secondary | ICD-10-CM

## 2024-04-26 LAB — CBC WITH DIFFERENTIAL/PLATELET
Abs Immature Granulocytes: 0.01 K/uL (ref 0.00–0.07)
Basophils Absolute: 0.1 K/uL (ref 0.0–0.1)
Basophils Relative: 2 %
Eosinophils Absolute: 0.2 K/uL (ref 0.0–0.5)
Eosinophils Relative: 4 %
HCT: 44.1 % (ref 36.0–46.0)
Hemoglobin: 14.4 g/dL (ref 12.0–15.0)
Immature Granulocytes: 0 %
Lymphocytes Relative: 27 %
Lymphs Abs: 1.4 K/uL (ref 0.7–4.0)
MCH: 31.4 pg (ref 26.0–34.0)
MCHC: 32.7 g/dL (ref 30.0–36.0)
MCV: 96.3 fL (ref 80.0–100.0)
Monocytes Absolute: 0.4 K/uL (ref 0.1–1.0)
Monocytes Relative: 7 %
Neutro Abs: 3.2 K/uL (ref 1.7–7.7)
Neutrophils Relative %: 60 %
Platelets: 301 K/uL (ref 150–400)
RBC: 4.58 MIL/uL (ref 3.87–5.11)
RDW: 14.6 % (ref 11.5–15.5)
WBC: 5.2 K/uL (ref 4.0–10.5)
nRBC: 0 % (ref 0.0–0.2)

## 2024-04-26 LAB — VITAMIN B12: Vitamin B-12: 646 pg/mL (ref 180–914)

## 2024-04-26 LAB — COMPREHENSIVE METABOLIC PANEL WITH GFR
ALT: 10 U/L (ref 0–44)
AST: 15 U/L (ref 15–41)
Albumin: 4.3 g/dL (ref 3.5–5.0)
Alkaline Phosphatase: 92 U/L (ref 38–126)
Anion gap: 6 (ref 5–15)
BUN: 14 mg/dL (ref 6–20)
CO2: 30 mmol/L (ref 22–32)
Calcium: 9.2 mg/dL (ref 8.9–10.3)
Chloride: 103 mmol/L (ref 98–111)
Creatinine, Ser: 0.75 mg/dL (ref 0.44–1.00)
GFR, Estimated: >60 mL/min
Glucose, Bld: 92 mg/dL (ref 70–99)
Potassium: 4.2 mmol/L (ref 3.5–5.1)
Sodium: 140 mmol/L (ref 135–145)
Total Bilirubin: 0.5 mg/dL (ref 0.0–1.2)
Total Protein: 6.8 g/dL (ref 6.5–8.1)

## 2024-04-26 LAB — FERRITIN: Ferritin: 86 ng/mL (ref 11–307)

## 2024-04-26 LAB — IRON AND TIBC
Iron: 136 ug/dL (ref 28–170)
Saturation Ratios: 44 % — ABNORMAL HIGH (ref 10.4–31.8)
TIBC: 312 ug/dL (ref 250–450)
UIBC: 176 ug/dL

## 2024-04-26 LAB — FOLATE: Folate: 9.2 ng/mL

## 2024-05-03 ENCOUNTER — Inpatient Hospital Stay: Admitting: Oncology

## 2024-05-03 DIAGNOSIS — E611 Iron deficiency: Secondary | ICD-10-CM | POA: Diagnosis not present

## 2024-05-03 NOTE — Progress Notes (Signed)
 " [facility] at [facility]  HEMATOLOGY NEW VISIT  Corwin Antu, FNP  REASON FOR REFERRAL: Iron deficiency   HISTORY OF PRESENT ILLNESS: Kristen Dunn 49 y.o. female referred for iron deficiency.  Patient reported that she has a history of iron deficiency in 2013 when she had her first given received IV iron infusion.  She had a gastric bypass done almost 15 years ago.  She reports fatigue and she also associates it with carbamazepine  use.  She has a past medical history of migraines and chronic constipation.  She reports not able to tolerate iron because of severe constipation.  She also reports significant pica that her husband had to buy an ice machine for.  She denies abdominal pain, rectal bleeding, melena.  She is on chronic birth control and does not have any menstruation.  She also reported significant weight gain of 30 pounds in the past 1 month and has been recently taken off of amitriptyline .  She has no other complaints today.  She received 2 doses of 510 IV Feraheme  on 02/25/2024 and 03/03/2024.  Tolerated both infusions well.  Reports improvement of her energy levels significantly since.  Appetite and energy levels are 100%.  Has occasional headaches secondary to migraines.  I have reviewed the past medical history, past surgical history, social history and family history with the patient   ALLERGIES:  is allergic to penicillins, aspirin, alupent [metaproterenol], lactose intolerance (gi), and latex.  MEDICATIONS:  Current Outpatient Medications  Medication Sig Dispense Refill   acetaminophen  (TYLENOL ) 500 MG tablet Take 1,000 mg by mouth every 6 (six) hours as needed for moderate pain.     albuterol  (VENTOLIN  HFA) 108 (90 Base) MCG/ACT inhaler Inhale 1-2 puffs into the lungs every 6 (six) hours as needed for wheezing or shortness of breath. 8 g 0   carbamazepine  (TEGRETOL ) 200 MG tablet Take 1 tablet in the morning, 1/2 tablet in afternoon and 1  tablet at bedtime 225 tablet 1   co-enzyme Q-10 30 MG capsule Take 30 mg by mouth daily.     Cyanocobalamin  (B-12 PO) Take 1 tablet by mouth daily.     docusate sodium  (COLACE) 100 MG capsule Take 1 capsule (100 mg total) by mouth 2 (two) times daily. (Patient taking differently: Take 100 mg by mouth 2 (two) times daily. Takes as needed) 10 capsule 0   MAGNESIUM PO Take 1 tablet by mouth daily.     Riboflavin (B-2 PO) Take 1 tablet by mouth daily.     Zavegepant HCl (ZAVZPRET ) 10 MG/ACT SOLN Place 1 spray into the nose daily as needed. 6 each 11   Atogepant  (QULIPTA ) 60 MG TABS Take 1 tablet (60 mg total) by mouth at bedtime. (Patient not taking: Reported on 05/03/2024) 30 tablet 5   BLISOVI  FE 1/20 1-20 MG-MCG tablet TAKE 1 TABLET BY MOUTH EVERY DAY IN THE MORNING (DO NOT PLACEBOS) (Patient not taking: Reported on 05/03/2024) 28 tablet 5   Plecanatide  (TRULANCE ) 3 MG TABS Take 1 tablet (3 mg total) by mouth daily. (Patient not taking: Reported on 05/03/2024) 90 tablet 0   prochlorperazine  (COMPAZINE ) 10 MG tablet Take 1 tablet (10 mg total) by mouth every 6 (six) hours as needed for nausea or vomiting. (Patient not taking: Reported on 05/03/2024) 30 tablet 1   No current facility-administered medications for this visit.     REVIEW OF SYSTEMS:   Constitutional: Denies fevers, chills or night sweats Eyes: Denies blurriness of vision Ears,  nose, mouth, throat, and face: Denies mucositis or sore throat Respiratory: Denies cough, dyspnea or wheezes Cardiovascular: Denies palpitation, chest discomfort or lower extremity swelling Gastrointestinal:  Denies nausea, heartburn or change in bowel habits Skin: Denies abnormal skin rashes Lymphatics: Denies new lymphadenopathy or easy bruising Neurological:Denies numbness, tingling or new weaknesses Behavioral/Psych: Mood is stable, no new changes  All other systems were reviewed with the patient and are negative.  PHYSICAL EXAMINATION:   Vitals:    05/03/24 0953  BP: 133/84  Pulse: 71  Resp: 16  Temp: 97.8 F (36.6 C)  SpO2: 100%    GENERAL:alert, no distress and comfortable SKIN: skin color, texture, turgor are normal, no rashes or significant lesions LYMPH:  no palpable lymphadenopathy in the cervical, axillary or inguinal LUNGS: clear to auscultation and percussion with normal breathing effort HEART: regular rate & rhythm and no murmurs and no lower extremity edema ABDOMEN:abdomen soft, non-tender and normal bowel sounds Musculoskeletal:no cyanosis of digits and no clubbing  NEURO: alert & oriented x 3 with fluent speech  LABORATORY DATA:  I have reviewed the data as listed  Lab Results  Component Value Date   WBC 5.2 04/26/2024   NEUTROABS 3.2 04/26/2024   HGB 14.4 04/26/2024   HCT 44.1 04/26/2024   MCV 96.3 04/26/2024   PLT 301 04/26/2024      Chemistry      Component Value Date/Time   NA 140 04/26/2024 0758   K 4.2 04/26/2024 0758   CL 103 04/26/2024 0758   CO2 30 04/26/2024 0758   BUN 14 04/26/2024 0758   CREATININE 0.75 04/26/2024 0758   CREATININE 0.93 04/06/2014 1230      Component Value Date/Time   CALCIUM 9.2 04/26/2024 0758   ALKPHOS 92 04/26/2024 0758   AST 15 04/26/2024 0758   ALT 10 04/26/2024 0758   BILITOT 0.5 04/26/2024 0758      Latest Reference Range & Units 01/21/24 11:50  Iron 42 - 145 ug/dL 59  TIBC 749.9 - 549.9 mcg/dL 465.1 (H)  Saturation Ratios 20.0 - 50.0 % 11.0 (L)  Ferritin 10.0 - 291.0 ng/mL 5.3 (L)  Transferrin 212.0 - 360.0 mg/dL 617.9 (H)  Vitamin B12 211 - 911 pg/mL 605  (H): Data is abnormally high (L): Data is abnormally low  RADIOGRAPHIC STUDIES: I have personally reviewed the radiological images as listed and agreed with the findings in the report.  MM 3D SCREENING MAMMOGRAM BILATERAL BREAST CLINICAL DATA:  Screening.  EXAM: DIGITAL SCREENING BILATERAL MAMMOGRAM WITH TOMOSYNTHESIS AND CAD  TECHNIQUE: Bilateral screening digital craniocaudal and  mediolateral oblique mammograms were obtained. Bilateral screening digital breast tomosynthesis was performed. The images were evaluated with computer-aided detection.  COMPARISON:  Previous exam(s).  ACR Breast Density Category b: There are scattered areas of fibroglandular density.  FINDINGS: There are no findings suspicious for malignancy.  IMPRESSION: No mammographic evidence of malignancy. A result letter of this screening mammogram will be mailed directly to the patient.  RECOMMENDATION: Screening mammogram in one year. (Code:[id number]-B-01Y)  BI-RADS CATEGORY  1: Negative.  Electronically Signed   By: Dina  Arceo M.D.   On: 02/23/2024 10:23   ASSESSMENT & PLAN:  Patient is a 49 y.o. female referred for iron deficiency  Iron deficiency Patient has iron deficiency without anemia.  The most likely cause of her anemia is due to chronic blood loss Lab Results  Component Value Date   IRON 136 04/26/2024   TIBC 312 04/26/2024   FERRITIN 86 04/26/2024  Latest Reference Range & Units 01/21/24 11:50  Saturation Ratios 20.0 - 50.0 % 11.0 (L)  (L): Data is abnormally low Last colonoscopy: 03/2021: one serrated polyp and non bleeding internal hemorrhoids She received 2 doses of IV Feraheme  on 02/25/2024 and 03/03/2024 with good tolerance. We reviewed most recent labs from 04/26/2024 which showed hemoglobin of 14.4 (12.1), platelets 301 (443) and ferritin 86. Folic acid  and B12 level are WNL. No additional IV iron needed at this time. Continue high iron rich diet   - Would not recommend oral iron because of chronic constipation - Encouraged patient to have high-protein, vitamin C and greens in her diet - If patient has continued iron deficiency, will reach out to GI for further workup   Return to clinic in 6 months with labs and telephone call.  Thrombocytosis Likely secondary to iron deficiency  Improved after iron infusion.   Orders Placed This Encounter  Procedures    Comprehensive metabolic panel with GFR    Standing Status:   Future    Expected Date:   10/31/2024    Expiration Date:   01/29/2025   CBC with Differential/Platelet    Standing Status:   Future    Expected Date:   10/31/2024    Expiration Date:   01/29/2025   Iron and TIBC    Standing Status:   Future    Expected Date:   10/31/2024    Expiration Date:   01/29/2025   Vitamin B12    Standing Status:   Future    Expected Date:   10/31/2024    Expiration Date:   01/29/2025   Folate    Standing Status:   Future    Expected Date:   10/31/2024    Expiration Date:   01/29/2025   Ferritin    Standing Status:   Future    Expected Date:   10/31/2024    Expiration Date:   01/29/2025    I spent 20 minutes dedicated to the care of this patient (face-to-face and non-face-to-face) on the date of the encounter to include what is described in the assessment and plan.    All questions were answered. The patient knows to call the clinic with any problems, questions or concerns. No barriers to learning was detected.   Delon FORBES Hope, NP 1/13/20262:08 PM "

## 2024-05-23 ENCOUNTER — Ambulatory Visit: Payer: BC Managed Care – PPO | Admitting: Neurology

## 2024-08-09 ENCOUNTER — Ambulatory Visit: Admitting: Neurology

## 2024-10-31 ENCOUNTER — Inpatient Hospital Stay

## 2024-11-04 ENCOUNTER — Inpatient Hospital Stay: Admitting: Oncology
